# Patient Record
Sex: Female | Born: 1935 | Race: White | Hispanic: No | Smoking: Former smoker
Health system: Southern US, Community
[De-identification: ages and names within clinical notes are randomized; demographics above are authoritative.]

## PROBLEM LIST (undated history)

## (undated) DIAGNOSIS — H409 Unspecified glaucoma: Secondary | ICD-10-CM

## (undated) DIAGNOSIS — M1990 Unspecified osteoarthritis, unspecified site: Secondary | ICD-10-CM

## (undated) DIAGNOSIS — H269 Unspecified cataract: Secondary | ICD-10-CM

## (undated) HISTORY — DX: Unspecified osteoarthritis, unspecified site: M19.90

## (undated) HISTORY — PX: EYE SURGERY: SHX253

## (undated) HISTORY — DX: Unspecified glaucoma: H40.9

## (undated) HISTORY — DX: Unspecified cataract: H26.9

## (undated) HISTORY — PX: OTHER SURGICAL HISTORY: SHX169

---

## 1998-03-30 ENCOUNTER — Ambulatory Visit (HOSPITAL_COMMUNITY): Admission: RE | Admit: 1998-03-30 | Discharge: 1998-03-30 | Payer: Self-pay | Admitting: *Deleted

## 2000-01-07 ENCOUNTER — Other Ambulatory Visit: Admission: RE | Admit: 2000-01-07 | Discharge: 2000-01-07 | Payer: Self-pay | Admitting: Orthopedic Surgery

## 2000-03-12 ENCOUNTER — Ambulatory Visit (HOSPITAL_COMMUNITY): Admission: RE | Admit: 2000-03-12 | Discharge: 2000-03-12 | Payer: Self-pay | Admitting: Ophthalmology

## 2001-06-10 ENCOUNTER — Ambulatory Visit (HOSPITAL_COMMUNITY): Admission: RE | Admit: 2001-06-10 | Discharge: 2001-06-10 | Payer: Self-pay | Admitting: *Deleted

## 2003-10-12 ENCOUNTER — Ambulatory Visit: Admission: RE | Admit: 2003-10-12 | Discharge: 2003-10-12 | Payer: Self-pay | Admitting: Internal Medicine

## 2004-06-20 ENCOUNTER — Encounter: Admission: RE | Admit: 2004-06-20 | Discharge: 2004-06-20 | Payer: Self-pay | Admitting: Orthopedic Surgery

## 2004-06-21 ENCOUNTER — Ambulatory Visit (HOSPITAL_BASED_OUTPATIENT_CLINIC_OR_DEPARTMENT_OTHER): Admission: RE | Admit: 2004-06-21 | Discharge: 2004-06-21 | Payer: Self-pay | Admitting: Orthopedic Surgery

## 2004-06-21 ENCOUNTER — Ambulatory Visit (HOSPITAL_COMMUNITY): Admission: RE | Admit: 2004-06-21 | Discharge: 2004-06-21 | Payer: Self-pay | Admitting: Orthopedic Surgery

## 2004-11-25 ENCOUNTER — Encounter: Admission: RE | Admit: 2004-11-25 | Discharge: 2004-11-25 | Payer: Self-pay | Admitting: Internal Medicine

## 2005-10-07 ENCOUNTER — Encounter (INDEPENDENT_AMBULATORY_CARE_PROVIDER_SITE_OTHER): Payer: Self-pay | Admitting: Specialist

## 2005-10-07 ENCOUNTER — Ambulatory Visit (HOSPITAL_COMMUNITY): Admission: RE | Admit: 2005-10-07 | Discharge: 2005-10-07 | Payer: Self-pay | Admitting: Internal Medicine

## 2006-08-19 ENCOUNTER — Encounter (INDEPENDENT_AMBULATORY_CARE_PROVIDER_SITE_OTHER): Payer: Self-pay | Admitting: *Deleted

## 2006-08-19 ENCOUNTER — Ambulatory Visit (HOSPITAL_BASED_OUTPATIENT_CLINIC_OR_DEPARTMENT_OTHER): Admission: RE | Admit: 2006-08-19 | Discharge: 2006-08-19 | Payer: Self-pay | Admitting: Orthopedic Surgery

## 2006-12-22 ENCOUNTER — Ambulatory Visit (HOSPITAL_COMMUNITY): Admission: RE | Admit: 2006-12-22 | Discharge: 2006-12-22 | Payer: Self-pay | Admitting: *Deleted

## 2006-12-22 ENCOUNTER — Encounter (INDEPENDENT_AMBULATORY_CARE_PROVIDER_SITE_OTHER): Payer: Self-pay | Admitting: *Deleted

## 2008-02-02 ENCOUNTER — Ambulatory Visit (HOSPITAL_COMMUNITY): Admission: RE | Admit: 2008-02-02 | Discharge: 2008-02-02 | Payer: Self-pay | Admitting: *Deleted

## 2008-02-02 ENCOUNTER — Encounter (INDEPENDENT_AMBULATORY_CARE_PROVIDER_SITE_OTHER): Payer: Self-pay | Admitting: *Deleted

## 2008-12-29 ENCOUNTER — Encounter: Admission: RE | Admit: 2008-12-29 | Discharge: 2008-12-29 | Payer: Self-pay | Admitting: Internal Medicine

## 2010-04-24 ENCOUNTER — Ambulatory Visit (HOSPITAL_COMMUNITY)
Admission: RE | Admit: 2010-04-24 | Discharge: 2010-04-24 | Payer: Self-pay | Source: Home / Self Care | Attending: Internal Medicine | Admitting: Internal Medicine

## 2010-09-11 NOTE — Op Note (Signed)
[name] [name], [name] [name]                ACCOUNT NO.:  [id number]   MEDICAL RECORD NO.:  [id number]          PATIENT TYPE:  AMB   LOCATION:  ENDO                         FACILITY:  [facility]   PHYSICIAN:  Georgiana Spinner, M.D.    DATE OF BIRTH:  11/05/1922   DATE OF PROCEDURE:  12/22/2006  DATE OF DISCHARGE:                               OPERATIVE REPORT   PROCEDURE:  Upper endoscopy.   INDICATIONS:  Abdominal pain.   ANESTHESIA:  Fentanyl 50 mcg, Versed 3 mg.   DESCRIPTION OF PROCEDURE:  With the patient mildly sedated in the left  lateral decubitus position, the Pentax videoscopic endoscope was  inserted in the mouth, passed under direct vision through the esophagus,  which appeared normal on first glance.  We entered into the stomach,  fundus, body, antrum, duodenal bulb, second portion duodenum which  showed a hiatal hernia only.  From this point the endoscope was slowly  withdrawn, taking circumferential views of duodenal mucosa until the  endoscope had been pulled back into the stomach, placed in retroflexion  to view the stomach from below.  The endoscope was then straightened and  withdrawn, taking circumferential views of remaining gastric and  esophageal mucosa, stopping to biopsy the distal esophagus which showed  a possible small area of Barrett's esophagus extending cephalad in the  esophagus.  The patient's vital signs and pulse oximeter remained  stable.  The patient tolerated the procedure well without apparent  complications.   FINDINGS:  Question of Barrett's esophagus, biopsied.  Await biopsy  report.  The patient will call me for results and follow up with me as  an outpatient.           ______________________________  Georgiana Spinner, M.D.     GMO/MEDQ  D:  12/22/2006  T:  12/22/2006  Job:  [id number]

## 2010-09-11 NOTE — Op Note (Signed)
NAMELEITA, LINDBLOOM                ACCOUNT NO.:  [id number]   MEDICAL RECORD NO.:  [id number]          PATIENT TYPE:  AMB   LOCATION:  ENDO                         FACILITY:  [facility]   PHYSICIAN:  Georgiana Spinner, M.D.    DATE OF BIRTH:  [birth year]   DATE OF PROCEDURE:  DATE OF DISCHARGE:                               OPERATIVE REPORT   PROCEDURE:  Upper endoscopy with biopsy.   INDICATIONS:  Gastroesophageal reflux disease with history of Barrett  esophagus.   ANESTHESIA:  Fentanyl 50 mcg, Versed 4 mg.   PROCEDURE:  With the patient mildly sedated in the left lateral  decubitus position the Pentax videoscopic endoscope was inserted in the  mouth, passed under direct vision through the esophagus which appeared  normal, although there was a questionable area of Barrett that I could  not really well see.  I photographed it and subsequently biopsied it.  We entered into the stomach, fundus, body, antrum, duodenal bulb, second  portion of the duodenum appeared normal.  From this point the endoscope  was slowly withdrawn taking circumferential views of duodenal mucosa  until the endoscope had been pulled back in the stomach placed in  retroflexion to view the stomach from below.  The endoscope was  straightened and withdrawn taking circumferential views of the remaining  gastric and esophageal mucosa.  The patient's vital signs and pulse  oximeter remained stable.  The patient tolerated the procedure well  without apparent complication.   FINDINGS:  Question of Barrett esophagus, biopsied.  Await biopsy  report.  The patient will call me for results and follow-up with me as  an outpatient.           ______________________________  Georgiana Spinner, M.D.     GMO/MEDQ  D:  02/02/2008  T:  02/02/2008  Job:  [id number]

## 2010-09-14 NOTE — Op Note (Signed)
[facility]  Patient:    [name] [name], [name] [name] Visit Number: [id number] MRN: [id number]          Service Type: END Location: ENDO Attending Physician:  Sabino Gasser Dictated by:   Sabino Gasser, M.D. Admit Date:  06/10/2001                             Operative Report  PROCEDURE:  Colonoscopy.  ENDOSCOPIST:  Sabino Gasser, M.D.  INDICATIONS:  Followup of colon polyps.  ANESTHESIA:  Demerol 50, Versed 3 mg.  DESCRIPTION OF PROCEDURE:  With the patient mildly sedated in the left lateral decubitus position, subsequently with pressure applied, the Olympus videoscopic colonoscope was inserted in the rectum and passed, under direct vision, to the cecum identified by the ileocecal valve, and appendiceal orifice both of which were photographed.  From this point, the colonoscope was slowly withdrawn taking circumferential views of the entire colonic mucosa stopping only in the rectum which appeared normal, on direct and in retroflex view.  The endoscope was straightened and withdrawn.  The patients vital signs and pulse oximeter remained stable.  The patient tolerated the procedure well without apparent complications.  FINDINGS:  Essentially negative colonoscopic examination to the cecum.  PLAN:  Repeat examination in 5 years. Dictated by:   Sabino Gasser, M.D. Attending Physician:  Sabino Gasser DD:  06/10/01 TD:  06/10/01 Job: [id number]

## 2010-09-14 NOTE — Op Note (Signed)
[place]  Patient:    [name] [name], [name] [name]                       MRN: [id number] Proc. Date: 03/12/00 Adm. Date:  [id number] Disc. Date: [id number] Attending:  Tommy Medal                           Operative Report  INDICATIONS AND JUSTIFICATIONS FOR THE PROCEDURE:  [name] [name] has been followed in my office for a number of years.  She was seen in 1994 and has been seen on a regular basis since she takes Plaquenil".  She has noticed moderately severe redundant skin of each upper lid blocking her upper field of vision and indeed visual field testing and photographs document this. She says that this bothers her upper field of vision and she can see something like a "show" over her vision.  She also has trouble reading.  Medically she should be stable with respect to having an upper lid blepharoplasty performed. Examination confirms that the skin of each lid indeed completely covers the lid margin and comes to the edge of her pupil.  Otherwise the pressure is 13 in each eye and the pupils are equal and reactive.  Her vision is correctable to 20/20 in the right eye and 20/30 in the left eye.  The conjunctivae, cornea and 2 chamber and dilated fundus exams are normal and she has 0.3 optic nerve cupping bilaterally.  She also has lens implants in each eye.  After discussing this with her she decided she did want upper eyelid optical blepharoplasties in order to improve her vision and improve the symptoms that she is having.  JUSTIFICATION FOR PERFORMING PROCEDURE IN AN OUTPATIENT SETTING:  Routine.  JUSTIFICATION FOR OVERNIGHT STAY: None.  PREOPERATIVE DIAGNOSIS:  Severe blepharochalasis with visual impairment.  POSTOPERATIVE DIAGNOSIS:  Severe blepharochalasis with visual impairment.  SURGEON:  Robert L. Dione Booze, M.D.  ANESTHESIA:  1% Xylocaine with epinephrine.  PROCEDURE:  Upper eyelid optical blepharoplasties.  PROCEDURE IN DETAIL:   The  patient arrived in the minor surgery room at [facility] and was prepped and draped in the routine fashion.  The skin to be excised was carefully demarcated using a sterile marking pencil and then a blade was used to cut along the lines that had been marked.  The skin was then carefully excised using forceps and scissors.  Then some underlying fatty tissue was also excised.  The wound was then closed with a single, running, 6-0 nylon suture on each side and patches were applied.  Neosporin ointment was also used.  After this the patient left the minor surgery room having done nicely.  FOLLOWUP CARE:  The patient was told to remove her patches in several hours and to use ointment inside her lower lids when she goes to bed at night.  She is to use warm compresses for comfort.  She is to be seen in my office in about 6 days to have the sutures removed. DD:  03/15/00 TD:  03/16/00 Job: [id number]

## 2010-09-14 NOTE — Op Note (Signed)
NAMELAWAN, NANEZ                ACCOUNT NO.:  [id number]   MEDICAL RECORD NO.:  [id number]          PATIENT TYPE:  AMB   LOCATION:  ENDO                         FACILITY:  [facility]   PHYSICIAN:  Georgiana Spinner, M.D.    DATE OF BIRTH:  [birth year]   DATE OF PROCEDURE:  10/07/2005  DATE OF DISCHARGE:                                 OPERATIVE REPORT   PROCEDURE:  Colonoscopy.   INDICATIONS:  Colon polyps.   ANESTHESIA:  Fentanyl 50 mcg, Versed 6 mg.   PROCEDURE:  The patient mildly sedated in the left lateral decubitus  position the Olympus videoscopic colonoscope was inserted in the rectum and  passed under direct vision to cecum identified by ileocecal valve and  appendiceal orifice both which were photographed.  From this point the  colonoscope was slowly withdrawn taking circumferential views of colonic  mucosa stopping only in the rectum where a small polyp was seen and removed  using hot biopsy forceps technique setting of 20/200 blended current and  hemorrhoids were seen on retroflexed view the endoscope was straightened and  withdrawn.  The patient's vital signs, pulse oximeter remained stable.  The  patient tolerated procedure well without apparent complications.   FINDINGS:  Internal hemorrhoids, small polyp of rectum, otherwise  unremarkable examination.   PLAN:  Await biopsy report.  The patient will call me for results and follow-  up with me as an outpatient.           ______________________________  Georgiana Spinner, M.D.     GMO/MEDQ  D:  10/07/2005  T:  10/07/2005  Job:  [id number]

## 2010-09-14 NOTE — Op Note (Signed)
NAMEZAMARA, COZAD                ACCOUNT NO.:  [id number]   MEDICAL RECORD NO.:  [id number]          PATIENT TYPE:  AMB   LOCATION:  DSC                          FACILITY:  [facility]   PHYSICIAN:  Katy Fitch. Sypher, M.D. DATE OF BIRTH:  [birth year]   DATE OF PROCEDURE:  08/19/2006  DATE OF DISCHARGE:                               OPERATIVE REPORT   PREOP DIAGNOSIS:  Chronic rheumatoid arthritis with severe synovitis and  prior reconstruction of severely unstable right index, long, ring, and  small finger metacarpal phalangeal joints with premature fracture of  implants to right long and small finger metacarpal phalangeal joints.   POSTOP DIAGNOSIS:  Poorly controlled rheumatoid synovitis leading to  soft tissue failure of reconstructions creating excess stress on the  implant arthroplasty components leading to a shear fracture of the long  finger proximal phalangeal stem adjacent to the hinge mechanism and  fracture of the right small finger metacarpal phalangeal joint implant  through the hinge mechanism.   OPERATIONS:  1. Re-exploration of right long finger metacarpal phalangeal joint      implant arthroplasty, 26 months status post index procedure, with      identification of fracture of size 30 [employer] NeuFlex hinge      arthroplasty component with subsequent synovectomy, reconstruction      of radial collateral ligament, reimplantation of a size 30 NeuFlex      implant followed by extensor tendon centralization.  2. Re-exploration of right small finger metacarpal phalangeal joint      implant arthroplasty with recovery of fractured implant components,      synovectomy, reconstruction of radial collateral ligament, and      replacement of a size 10 NeuFlex implant arthroplasty component,      followed by extensor tendon centralization.   OPERATING SURGEON:  Katy Fitch. Sypher, M.D.   ASSISTANT:  Marveen Reeks. Dasnoit, P.A.-C.   ANESTHESIA:  General by LMA.   SUPERVISING  ANESTHESIOLOGIST:  Guadalupe Maple, M.D.   INDICATIONS:  [name] [name] is an 77 year old woman referred through the  courtesy of Dr. Lemmie Evens for evaluation and management of  chronic rheumatoid hand deformities.   In 2002 she appeared to have good control of her synovitis and underwent  a reconstruction of her right, index, long, ring, and small finger  metacarpal phalangeal joints utilizing [employer] NeuFlex Silastic implant  arthroplasty components.   In the early postoperative period she had a superior result with  complete correction of her ulnar drift, intrinsic tightness, and an 80-  degrees arc of metacarpal phalangeal joint motion.   During our most recent follow up evaluation, 2 years post surgery, I  noted a subluxation deformity of the long finger metacarpal phalangeal  joint.  Follow up x-rays revealed that the long finger implant  arthroplasty components had fractured; and the joint was overriding with  a palmar subluxation of the proximal phalanx beneath the metacarpal  head.  There were issues with the ring and small finger MP joints as  well with signs of recurrent synovitis.  I advised [name] to  proceed with  a timely reimplantation of a new hinge with proper soft  tissue management to prevent loss of her reconstructive result.   We reviewed her medical status.  She is currently being maintained on  Plaquenil and Voltaren.  She is not on any other disease remitting  agents.  She has returned to the operating room, at this time,  anticipating repeat reconstruction of the long finger.  During our  interview in the preoperative holding area, her small finger appeared to  be subluxed.  I went back and studied her most recent x-rays of March  2008.  With the aid of a loupe magnification I discerned that she had a  tear in her implant in the small finger MP joint; therefore, I  recommended proceeding with revision of the small finger MP joint at  this time as  well.   Preoperatively, I had a lengthy discussion with [name].  She is  understandably frustrated that her reconstructive hand surgery has lost  some of its intended result.  I explained to her that it was not  entirely clear to me what the primary pathophysiology was at this time.  Inspection of her hands reveals persistent synovitis in the region of  her MP joints and extensor tendons.  She has a native thumb MP joint  that is experiencing a type 1Z collapse.  There is a significant  possibility that she has poorly controlled synovitis.  After informed  consent, she is brought to the operating room, at this time,  anticipating revision surgery.  She understands that unless her soft  tissue illness is controlled we cannot guarantee any result of  arthroplasty.   DESCRIPTION OF PROCEDURE:  [name] is brought to the operating room  and placed in the supine position upon the operating table.  Following  an anesthesia consult with Dr. Noreene Larsson, general anesthesia by LMA was  recommended and accepted by [name].  She was subsequent transferred  to room #1, placed in the supine position on the operating table, and 1  gram of Ancef administered as an IV prophylactic antibiotic.  The right  arm was prepped with Betadine soap and solution and sterilely draped.  A  pneumatic tourniquet was applied to the proximal brachium.   Following exsanguination, of the right arm with an Esmarch bandage, the  arterial tourniquet was inflated to 220 mmHg.  The procedure commenced  with resection of the prior surgical scar overlying the long, ring, and  small finger MP joints.  Great care was taken to identify the dorsal  veins and cutaneous sensory nerves.  The extensor tendon of the long  finger was noted to be bulging overlying a capsule of a florid  rheumatoid synovitis.   The extensor tendon was split along its radial border; and the rheumatoid synovitis meticulously debrided.  A complete  synovectomy of  the MP joint was accomplished followed by recovery of the implant  fragments.  The implant was intact with respect to the proximal stem and  hinge.  However, careful inspection of the hinge revealed an 80% failure  of the hinge mechanism at 26 months post implantation.  The distal hinge  stem implant had sheared causing the subluxation of the joint.  The  distal component was easily retrieved with a fine Therapist, nutritional.  The  intermaxillary canals were curetted to remove all foreign body material  followed by meticulous synovectomy of the joint.  The radial collateral  ligament was reconstructed by gathering the distal  ligament with a 3-0  FiberWire suture and tensioning the radial capsule and collateral  ligament to correct ulnar drift.  After irrigation a new size 30 implant  was placed in the long finger MP joint with minimal touch technique  followed by repair of the capsule, and extensor mechanism with multiple  mattress sutures of 3-0 Ethibond.  The extensor tendon was centralized.  The radial sagittal fibers were in good condition.   Attention was then directed to the small finger.  The neurovascular  structures were gently dissected off of the capsule followed by incision  along the radial border of the extensor digitorum longus tendon.  After  capsulotomy, a complete synovectomy was accomplished followed by removal  of the fractured implant components.  In the small finger there was  excessive wear in the ulnar aspect of the hinge; and the hinge had split  in an oblique manner across the hinge.  This was a very unusual manner  of failure based on 25 years experience of doing rheumatoid  reconstructive surgery.   I contacted the [employer] orthopedic representative and explained my  concerns about early fracture of the implants at only 20 some months  following implantation or perhaps even sooner.  He asked that I retain  the components and send them back to [facility] for  analysis.  The implants  were disinfected in a chlorhexidine solution for 15 minutes; and  subsequently washed with water and placed in sterile saline.  They will  be provided for [facility] to analyze.   The small finger intermedullary canals of the proximal phalanx and  metacarpal were cleared of all soft tissues with the micro rongeur,  micro curette, and standard curette.  After synovectomy was completed,  the radial collateral ligament was reconstructed with a grasping suture  of 3-0 FiberWire followed by reimplantation of a size 10 [employer] NeuFlex  arthroplasty component.  The capsule was repaired followed by repair of  the extensor mechanism centralizing the extensor digitorum longus and  extensor digiti minimi.  The prior subluxation and ulnar drift of the MP  joints was corrected.  The intrinsic tightness noted in the long finger  with recurrent swan-neck deformity was also corrected by this procedure.  The skin wound was then repaired with intradermal 3-0 Prolene and Steri-  Strips.  Ms. Bagent was placed in a voluminous gauze dressing with  loops of gauze, maintaining radial deviation of the MP joints with the  MP joints at about 15 degrees of flexion.  Dorsal-to-palmar plaster  sandwich splints were applied for postoperative dressing.  There no  apparent complications.   For aftercare she will be provided prescriptions for Percocet 5 mg one  p.o. q.4-6 h. p.r.n. pain, 30 tablets without refill.  Also Keflex 500  mg one p.o. q.8 h. x4 days as a prophylactic antibiotic.  She will  return to our office for followup in 1 week for dressing change, x-ray,  and advancement into a splinting program.      Katy Fitch. Sypher, M.D.  Electronically Signed     RVS/MEDQ  D:  08/19/2006  T:  08/19/2006  Job:  [id number]   cc:   Lemmie Evens, M.D.

## 2010-09-14 NOTE — Op Note (Signed)
NAMELEANER, MORICI                ACCOUNT NO.:  [id number]   MEDICAL RECORD NO.:  [id number]          PATIENT TYPE:  AMB   LOCATION:  DSC                          FACILITY:  [facility]   PHYSICIAN:  Katy Fitch. Sypher Montez Hageman., M.D.DATE OF BIRTH:  24-Jun-1922   DATE OF PROCEDURE:  06/21/2004  DATE OF DISCHARGE:                                 OPERATIVE REPORT   PREOPERATIVE DIAGNOSIS:  Rheumatoid arthritis affecting the right hand with  a type 1 Z collapse deformity of right thumb and significant ulnar drift and  intrinsic tightness with subluxation of the index, long, ring and small  finger metacarpal phalangeal joints; status post failure of conservative  management.   POSTOPERATIVE DIAGNOSIS:  Rheumatoid arthritis affecting the right hand with  a type 1 Z collapse deformity of right thumb and significant ulnar drift and  intrinsic tightness with subluxation of the index, long, ring and small  finger metacarpal phalangeal joints; status post failure of conservative  management.   OPERATION:  1.  Implant arthroplasty of right index finger metacarpal phalangeal joint      with radial collateral ligament reconstruction, ulnar intrinsic release,      centralization of extensor tendon and placement of a size 20 DePuy      NuFlex implant.  2.  Implant arthroplasty of right long finger MP joint with radial      collateral ligament reconstruction, intrinsic release extensor      realignment and placement of size 30 DePuy NuFlex implant.  3.  Implant arthroplasty of right ring finger metacarpal phalangeal joint      with radial collateral ligament reconstruction, extensor realignment and      intrinsic release utilizing a size 20 DePuy NuFlex implant.  4.  Implant arthroplasty of right small finger metacarpal phalangeal joint      with radial collateral ligament reconstruction, abductor digiti minimi      release, extensor realignment and placement of a size 10 Depuy NuFlex      implant.   OPERATING SURGEON:  Katy Fitch. Sypher, M.D.   ASSISTANT:  Jonni Sanger, P.A.   ANESTHESIA:  Infraclavicular block.   SUPERVISING ANESTHESIOLOGIST:  Dr. Adonis Huguenin.   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 Syliva Overman, rheumatologist for  evaluation and management of bilateral rheumatoid hand deformities.   She had type 1 Z collapse deformity of right thumb, ulnar drift of all of  her fingers, moderate involvement of her interphalangeal joints with  degenerative and rheumatoid arthritis changes and impaired grasped and pinch  prehension.   We had two lengthy consultations regarding her hand predicament. I advised  her to live with her hands as long as she possibly could and at some point  the future told her she would benefit from fusion of her thumb MP joint and  implant arthroplasty of her index long ring and small finger MP joints.  Her  wrist was stable and not in radial deviation.  After a lengthy period of  deliberation, she decided to proceed with reconstruction of her  right index,  long, ring and small fingers and deferred treatment of her thumb at this  time.  She is brought to the operating room, anticipating reconstruction.  We have explained potential risks, benefits in detail including potential  anesthetic complications, postoperative complications including infection,  pneumonia, cardiac issues and pulmonary embolus.  Questions regarding her  surgery were invited, answered. She understands the procedure, aftercare,  potential risks, and benefits.   PROCEDURE:  Alia Parsley was brought to the operating room and placed in  supine position on the table.  Following anesthesia consult by Dr. Krista Blue,  an  infraclavicular block was placed without complication on the right.  Following this the proper site identification protocol, a time-out was  completed followed by induction of general  sedation.   The right arm was prepped with Betadine soap solution, sterilely draped. A  pneumatic tourniquet was applied to the proximal brachium.  Following  exsanguination of right arm with Esmarch bandage, the arterial tourniquet  was inflated to 220 mmHg. 1 gram of Ancef was administered as an IV  prophylactic antibiotic in the preoperative holding area.   The procedure commenced with a standard incision extending from the radial  aspect the metacarpal neck of the index finger to the ulnar aspect of the  small finger metacarpal neck.  Dorsal veins and sensory nerves were  preserved. The extensor mechanisms were dissected to allow complete  evaluation of the intrinsic tightness predicament and the breakdown of the  radial sagittal fibers and tightness of the ulnar intrinsics.   The index, long, ring and small fingers were sequentially treated in the  same manner with exception of small finger which had specific release of the  abductor digiti minimi tendon followed by exposure of the capsule by release  the radial sagittal fibers, longitudinal incision of the capsules, thorough  synovectomy of the index, long, ring and small finger metacarpal phalangeal  joints, release of the ulnar collateral ligaments, careful preservation of  the radial collateral ligaments, subsequent exposure of the metacarpal necks  with baby Bennett retractors, resection of the metacarpal heads index, long,  ring, small followed by preparation of the canals, the metacarpals and  proximal phalanges utilizing the DePuy reamers and hand technique except  with a small finger that required use of a Swanson reamer to size the  proximal phalanx to accept a size 10 implant. The trials were used  sequentially to size proper implants, ultimately deciding on a size 20 for  the index, size 34 for the long, size 20 for the ring, and size 10 for the  small.  The medullary canals were thoroughly irrigated with triple  antibiotic  solution, followed by careful debridement of all synovitis. The implants  were placed with no-touch technique followed by sequential reconstruction of  the radial collateral ligaments of the index, long, ring and small fingers  with through bone technique in the manner of Swanson, followed by anatomic  realignment of the extensor mechanism by reefing of the radial sagittal  fibers, release of the ulnar sagittal fibers and release of the ulnar  intrinsic tendons in the manner of Littler and more proximal release when  necessary.   In the small finger, the juncture of the extensor digiti minimi and the  common extensor was carefully reconstructed to centralize the extensor as  well as create stability by reefing of the radial sagittal fibers. The ulnar  sagittal fibers were quite patulous in the small finger due to extensive  synovitis.  At the completion of procedures, the fingers were all aligned in a neutral  position with relief of the intrinsic tightness, with a negative Bunnell's  intrinsic tightness test. The alignment was virtually anatomic.   The tourniquet was released and hemostasis achieved. Tourniquet time was  approximately one hour 59 minutes at 220 mmHg. Bleeding was controlled by 15  minutes of direct pressure followed by closure of the wound over vessel loop  drains utilizing 4-0 Prolene intradermal suture.   The wound was dressed with Xeroflo after expression of hematoma followed by  application of voluminous sterile gauze dressing with a radialization loops  of gauze followed by Kerlix fluff, sterile Webril and a sugar-tong sandwich  splint maintaining the wrist in 30 degrees dorsiflexion and MPs blocked at  approximately 40 degrees of flexion.   There were no apparent complications. Ms. Mcgloin tolerated the  infraclavicular block and her sedation well.  She will be transferred to the  Post Anesthesia Care Unit and ultimately to recovery care center  for  overnight observation anticipating IV Ancef 1 gram q.8 h as a prophylactic  biotic x3 doses as well as p.o. and IV Dilaudid for pain and possible use of  a PCA pump should she experience significant discomfort.      RVS/MEDQ  D:  06/21/2004  T:  06/21/2004  Job:  [id number]   cc:   Lemmie Evens, M.D.  [address]  [place]  [place] [address]  Fax: [phone]

## 2011-02-19 ENCOUNTER — Other Ambulatory Visit: Payer: Self-pay | Admitting: Ophthalmology

## 2012-09-10 ENCOUNTER — Other Ambulatory Visit: Payer: Self-pay | Admitting: Internal Medicine

## 2012-09-10 DIAGNOSIS — Z1231 Encounter for screening mammogram for malignant neoplasm of breast: Secondary | ICD-10-CM

## 2012-10-01 ENCOUNTER — Ambulatory Visit
Admission: RE | Admit: 2012-10-01 | Discharge: 2012-10-01 | Disposition: A | Discharge status: Home / Self Care | Payer: Medicare Other | Source: Ambulatory Visit | Attending: Internal Medicine | Admitting: Internal Medicine

## 2012-10-01 DIAGNOSIS — Z1231 Encounter for screening mammogram for malignant neoplasm of breast: Secondary | ICD-10-CM

## 2012-10-07 ENCOUNTER — Other Ambulatory Visit: Payer: Self-pay | Admitting: Internal Medicine

## 2012-10-07 DIAGNOSIS — R928 Other abnormal and inconclusive findings on diagnostic imaging of breast: Secondary | ICD-10-CM

## 2012-10-21 ENCOUNTER — Ambulatory Visit
Admission: RE | Admit: 2012-10-21 | Discharge: 2012-10-21 | Disposition: A | Discharge status: Home / Self Care | Payer: Medicare Other | Source: Ambulatory Visit | Attending: Internal Medicine | Admitting: Internal Medicine

## 2012-10-21 DIAGNOSIS — R928 Other abnormal and inconclusive findings on diagnostic imaging of breast: Secondary | ICD-10-CM

## 2013-05-13 ENCOUNTER — Ambulatory Visit: Payer: Self-pay | Admitting: Podiatry

## 2013-05-14 ENCOUNTER — Ambulatory Visit (INDEPENDENT_AMBULATORY_CARE_PROVIDER_SITE_OTHER): Payer: Medicare Other | Admitting: Podiatrist

## 2013-05-14 ENCOUNTER — Encounter: Payer: Self-pay | Admitting: Podiatrist

## 2013-05-14 VITALS — BP 117/72 | HR 60 | Resp 18

## 2013-05-14 DIAGNOSIS — M216X2 Other acquired deformities of left foot: Secondary | ICD-10-CM

## 2013-05-14 DIAGNOSIS — Q828 Other specified congenital malformations of skin: Secondary | ICD-10-CM

## 2013-05-14 DIAGNOSIS — M216X9 Other acquired deformities of unspecified foot: Secondary | ICD-10-CM

## 2013-05-14 NOTE — Progress Notes (Signed)
   Subjective:    Patient ID: [name] [name], female    DOB: 1923/02/11, 80 y.o.   MRN: [id number]  HPI I have a place on the ball of my left  foot and has been going on for a year and sore and tender and it hurts the way I walk    Review of Systems  Constitutional: Negative.   Eyes: Positive for pain, redness and itching.  Respiratory: Negative.   Cardiovascular: Negative.   Gastrointestinal: Negative.   Endocrine: Positive for cold intolerance.  Genitourinary: Negative.   Musculoskeletal: Positive for back pain.       Difficulty walking  Skin: Positive for rash.  Allergic/Immunologic: Negative.   Neurological: Positive for dizziness and headaches.  Hematological: Bruises/bleeds easily.  Psychiatric/Behavioral: Negative.        Objective:   Physical Exam Chart is reviewed. Neurovascular status is intact.  Palpable pedal pulses are noted.  Hyperkeratotic lesion submetatarsal 2 is present.  Ground glass appearance with intact integument post debridement noted.  Prominent metatarsal with porokeratotic lesion left     Assessment & Plan:  Prominent metatarsal with porokeratotic lesion submetatarsal 2 left foot  Plan: Debrided the lesion with a #15 blade without complication. Padding was also applied and the patient was given instructions for aftercare. She'll be seen back when necessary

## 2013-05-14 NOTE — Patient Instructions (Signed)

## 2013-06-04 NOTE — Addendum Note (Signed)
Addended by: Delories Heinz on: 06/04/2013 05:14 PM   Modules accepted: Level of Service

## 2013-08-13 ENCOUNTER — Encounter (HOSPITAL_COMMUNITY): Payer: Self-pay | Admitting: Emergency Medicine

## 2013-08-13 ENCOUNTER — Emergency Department (HOSPITAL_COMMUNITY)
Admission: EM | Admit: 2013-08-13 | Discharge: 2013-08-13 | Disposition: A | Discharge status: Home / Self Care | Payer: Medicare Other | Attending: Emergency Medicine | Admitting: Emergency Medicine

## 2013-08-13 DIAGNOSIS — Z8669 Personal history of other diseases of the nervous system and sense organs: Secondary | ICD-10-CM | POA: Insufficient documentation

## 2013-08-13 DIAGNOSIS — Z9181 History of falling: Secondary | ICD-10-CM | POA: Insufficient documentation

## 2013-08-13 DIAGNOSIS — F17200 Nicotine dependence, unspecified, uncomplicated: Secondary | ICD-10-CM | POA: Insufficient documentation

## 2013-08-13 DIAGNOSIS — M7981 Nontraumatic hematoma of soft tissue: Secondary | ICD-10-CM | POA: Insufficient documentation

## 2013-08-13 DIAGNOSIS — Z8781 Personal history of (healed) traumatic fracture: Secondary | ICD-10-CM | POA: Insufficient documentation

## 2013-08-13 DIAGNOSIS — M129 Arthropathy, unspecified: Secondary | ICD-10-CM | POA: Insufficient documentation

## 2013-08-13 DIAGNOSIS — Z7982 Long term (current) use of aspirin: Secondary | ICD-10-CM | POA: Insufficient documentation

## 2013-08-13 DIAGNOSIS — T148XXA Other injury of unspecified body region, initial encounter: Secondary | ICD-10-CM

## 2013-08-13 DIAGNOSIS — Z79899 Other long term (current) drug therapy: Secondary | ICD-10-CM | POA: Insufficient documentation

## 2013-08-13 NOTE — Discharge Instructions (Signed)

## 2013-08-13 NOTE — ED Provider Notes (Signed)
Medical screening examination/treatment/procedure(s) were conducted as a shared visit with non-physician practitioner(s) and myself.  I personally evaluated the patient during the encounter.   EKG Interpretation None      Patient seen by me. Patient with bruising to her left distal shin area. No skin tear small hematoma underneath. Patient not sure when it happened. Patient's not on Coumadin or warfarin or other blood thinners. Patient given reassurance that it will heal and to be careful about bumping it is a skin to tear easily.  Shelda Jakes, MD 08/13/13 [phone]

## 2013-08-13 NOTE — ED Notes (Signed)
Pt states she has a brusie to her lower left leg that she first noticed today while in the shower.  Pt states it is not causing any pain, but was somewhat tender to assessors palpation.  Skin is not warmer than surrounding tissues and is dry.

## 2013-08-13 NOTE — ED Provider Notes (Signed)
CSN: [id number]     Arrival date & time 08/13/13  1725 History   First MD Initiated Contact with Patient 08/13/13 1815     Chief Complaint  Patient presents with  . Bleeding/Bruising     (Consider location/radiation/quality/duration/timing/severity/associated sxs/prior Treatment) HPI Comments: Patient presents to the emergency department with chief complaint of contusion. She states that she noticed a bruise to her left ankle. He denies any recent injury, but states that she did fall about 2 months ago. She sustained a broken wrist at that time, which was managed by another provider. He states that she showed a bruise to a pharmacist today, and was instructed to have it examined. She denies any associated pain, swelling, fever, or chills. Denies any chest pain or shortness of breath. She takes a baby aspirin daily.  The history is provided by the patient. No language interpreter was used.    Past Medical History  Diagnosis Date  . Arthritis   . Cataract   . Glaucoma    Past Surgical History  Procedure Laterality Date  . Eye surgery    . Right hand surgery     History reviewed. No pertinent family history. History  Substance Use Topics  . Smoking status: Current Every Day Smoker    Types: Cigarettes  . Smokeless tobacco: Never Used  . Alcohol Use: No   OB History   Grav Para Term Preterm Abortions TAB SAB Ect Mult Living                 Review of Systems  Constitutional: Negative for fever and chills.  Respiratory: Negative for shortness of breath.   Cardiovascular: Negative for chest pain.  Gastrointestinal: Negative for nausea, vomiting, diarrhea and constipation.  Genitourinary: Negative for dysuria.  Hematological: Bruises/bleeds easily.      Allergies  Review of patient's allergies indicates no known allergies.  Home Medications   Prior to Admission medications   Medication Sig Start Date End Date Taking? Authorizing Provider  aspirin 81 MG tablet Take 81  mg by mouth daily.   Yes Historical Provider, MD  atorvastatin (LIPITOR) 20 MG tablet Take 20 mg by mouth daily.  05/13/13  Yes Historical Provider, MD  cholecalciferol (VITAMIN D) 1000 UNITS tablet Take 1,000 Units by mouth daily.   Yes Historical Provider, MD  denosumab (PROLIA) 60 MG/ML SOLN injection Inject 60 mg into the skin every 6 (six) months. Administer in upper arm, thigh, or abdomen   Yes Historical Provider, MD  folic acid (FOLVITE) 1 MG tablet Take 1 mg by mouth daily.  05/13/13  Yes Historical Provider, MD  methotrexate (RHEUMATREX) 2.5 MG tablet Take 2.5 mg by mouth once a week. Caution:Chemotherapy. Protect from light.   Yes Historical Provider, MD  Multiple Vitamins-Minerals (ICAPS MV PO) Take 1 tablet by mouth daily.   Yes Historical Provider, MD  omega-3 acid ethyl esters (LOVAZA) 1 G capsule Take 1 g by mouth 2 (two) times daily.    Yes Historical Provider, MD  traMADol (ULTRAM) 50 MG tablet Take 50 mg by mouth every 6 (six) hours as needed for moderate pain.    Yes Historical Provider, MD   BP 124/66  Pulse 76  Temp(Src) 98 F (36.7 C) (Oral)  Resp 18  SpO2 98% Physical Exam  Nursing note and vitals reviewed. Constitutional: She is oriented to person, place, and time. She appears well-developed and well-nourished.  HENT:  Head: Normocephalic and atraumatic.  Eyes: Conjunctivae and EOM are normal. Pupils are equal,  round, and reactive to light.  Neck: Normal range of motion. Neck supple.  Cardiovascular: Normal rate, regular rhythm and intact distal pulses.  Exam reveals no gallop and no friction rub.   No murmur heard. Intact distal pulses, with brisk capillary refill  Pulmonary/Chest: Effort normal and breath sounds normal. No respiratory distress. She has no wheezes. She has no rales. She exhibits no tenderness.  Abdominal: Soft. Bowel sounds are normal. She exhibits no distension and no mass. There is no tenderness. There is no rebound and no guarding.    Musculoskeletal: Normal range of motion. She exhibits no edema and no tenderness.  Range of motion strength 5/5, no unilateral leg swelling, no evidence of DVT, no evidence of cellulitis or septic joint  Neurological: She is alert and oriented to person, place, and time.  Sensation intact  Skin: Skin is warm and dry.     Contusion to left anterior shin  Psychiatric: She has a normal mood and affect. Her behavior is normal. Judgment and thought content normal.    ED Course  Procedures (including critical care time) Labs Review Labs Reviewed - No data to display  Imaging Review No results found.   EKG Interpretation None      MDM   Final diagnoses:  Contusion    Patient with contusion. No evidence of DVT. Patient seen by and discussed with Dr. Deretha Emory , who agrees with the plan. Patient is stable and ready for discharge to home.    Roxy Horseman, PA-C 08/13/13 2048

## 2013-08-13 NOTE — ED Notes (Signed)
She noticed yesterday she has bruising/bleeding to L outer ankle/calf area. She denies any injuries. She did fall onto this leg in February and was evaluated for injuries at that time. Cms intact distal to bruising.

## 2013-10-22 ENCOUNTER — Ambulatory Visit (INDEPENDENT_AMBULATORY_CARE_PROVIDER_SITE_OTHER): Payer: Medicare Other | Admitting: Podiatrist

## 2013-10-22 DIAGNOSIS — Q828 Other specified congenital malformations of skin: Secondary | ICD-10-CM

## 2013-10-22 DIAGNOSIS — M216X9 Other acquired deformities of unspecified foot: Secondary | ICD-10-CM

## 2013-10-22 DIAGNOSIS — M216X2 Other acquired deformities of left foot: Secondary | ICD-10-CM

## 2013-10-22 NOTE — Patient Instructions (Signed)
Corns and Calluses Corns are small areas of thickened skin that usually occur on the top, sides, or tip of a toe. They contain a cone-shaped core with a point that can press on a nerve below. This causes pain. Calluses are areas of thickened skin that usually develop on hands, fingers, palms, soles of the feet, and heels. These are areas that experience frequent friction or pressure. CAUSES  Corns are usually the result of rubbing (friction) or pressure from shoes that are too tight or do not fit properly. Calluses are caused by repeated friction and pressure on the affected areas. SYMPTOMS  A hard growth on the skin.  Pain or tenderness under the skin.  Sometimes, redness and swelling.  Increased discomfort while wearing tight-fitting shoes. DIAGNOSIS  Your caregiver can usually tell what the problem is by doing a physical exam. TREATMENT  Removing the cause of the friction or pressure is usually the only treatment needed. However, sometimes medicines can be used to help soften the hardened, thickened areas. These medicines include salicylic acid plasters and 12% ammonium lactate lotion. These medicines should only be used under the direction of your caregiver. HOME CARE INSTRUCTIONS   Try to remove pressure from the affected area.  You may wear donut-shaped corn pads to protect your skin.  You may use a pumice stone or nonmetallic nail file to gently reduce the thickness of a corn.  Wear properly fitted footwear.  If you have calluses on the hands, wear gloves during activities that cause friction.  If you have diabetes, you should regularly examine your feet. Tell your caregiver if you notice any problems with your feet. SEEK IMMEDIATE MEDICAL CARE IF:   You have increased pain, swelling, redness, or warmth in the affected area.  Your corn or callus starts to drain fluid or bleeds.  You are not getting better, even with treatment. Document Released: 01/20/2004 Document  Revised: 07/08/2011 Document Reviewed: 12/11/2010 [employer] Patient Information 2015 [employer]. This information is not intended to replace advice given to you by your health care provider. Make sure you discuss any questions you have with your health care provider.  

## 2013-10-22 NOTE — Progress Notes (Signed)
Chief Complaint  Patient presents with  . Callouses    "I hope cut the callous off my foot." left 2nd MPJ area plantar     HPI: Patient is 80 y.o. female who presents today for painful callus submet 2 of the left foot.  She also has a hammertoe contracture of the lesser digits bilateral.       Physical Exam GENERAL APPEARANCE: Alert, conversant. Appropriately groomed. No acute distress.  VASCULAR: Pedal pulses palpable at 2/4 DP and PT bilateral.  Capillary refill time is immediate to all digits,  Proximal to distal temperature is warm to warm.  Digital hair growth is present bilateral  NEUROLOGIC: sensation is intact epicritically and protectively to 5.07 monofilament at 5/5 sites bilateral.  Light touch is intact bilateral, vibratory sensation intact bilateral, achilles tendon reflex is intact bilateral.  MUSCULOSKELETAL: acceptable muscle strength, tone and stability bilateral.  Contracture of lesser digits is present left foot.  Prominent, and plantar flexed 2nd metatarsal is present left. DERMATOLOGIC: skin color, texture, and turger are within normal limits.  Large painful callus is present submet 2 of the left foot.  There is ground glass appearance and intact integument is present post debridement.  Assessment: porokeratotic lesion/ callus submet 2 left foot  Plan:  Debridement of the lesion accomplished today without complication. She will return as needed for follow up.

## 2013-11-26 ENCOUNTER — Ambulatory Visit (INDEPENDENT_AMBULATORY_CARE_PROVIDER_SITE_OTHER): Payer: Medicare Other | Admitting: Podiatrist

## 2013-11-26 DIAGNOSIS — M216X9 Other acquired deformities of unspecified foot: Secondary | ICD-10-CM

## 2013-11-26 DIAGNOSIS — M216X2 Other acquired deformities of left foot: Secondary | ICD-10-CM

## 2013-11-26 DIAGNOSIS — M7150 Other bursitis, not elsewhere classified, unspecified site: Secondary | ICD-10-CM

## 2013-11-26 MED ORDER — DEXAMETHASONE SODIUM PHOSPHATE 120 MG/30ML IJ SOLN
4.0000 mg | Freq: Once | INTRAMUSCULAR | Status: AC
  Administered 2013-11-26: 4 mg via INTRA_ARTICULAR

## 2013-11-26 NOTE — Progress Notes (Signed)
Chief Complaint  Patient presents with  . Callouses    "That place where the callous was trimmed is sore."  left 2nd MPJ plantar area     HPI: Patient is 80 y.o. female who presents today for continued pain submet 2 of the left foot.  She relates she had minimal improvement after her last visit where I debrided the callus.  She would like to know how to get rid of the callus like we did on the right foot.   Physical Exam  Neurovascular status unchanged and intact.  Severe contracture of digits 1-5 left is noted with prominent metatarsal heads present.  Large callus is present submet 2 left with surround soft tissue inflammation present  Assessment: Prominent and plantarflexed metatarsals left, porokeratosis x1, bursitis  Plan: Lang: Injected the inflamed area submetatarsal 2 with dexamethasone and Marcaine mixture without complication. Agreed the callus down as far as possible without complication. Applied an offloading pad. She'll be seen back as needed for followup. I discussed there is no way to get this to go away completely due to her for deformity and gait pattern. We can try an insert for her shoe if the callus continues to return.

## 2014-01-07 ENCOUNTER — Ambulatory Visit (INDEPENDENT_AMBULATORY_CARE_PROVIDER_SITE_OTHER): Payer: Medicare Other | Admitting: Podiatrist

## 2014-01-07 DIAGNOSIS — M216X2 Other acquired deformities of left foot: Secondary | ICD-10-CM

## 2014-01-07 DIAGNOSIS — M216X9 Other acquired deformities of unspecified foot: Secondary | ICD-10-CM

## 2014-01-07 DIAGNOSIS — Q828 Other specified congenital malformations of skin: Secondary | ICD-10-CM

## 2014-01-07 NOTE — Progress Notes (Signed)
Chief Complaint   Patient presents with   .  Callouses        HPI: Patient is 80 y.o. female who presents today for painful callus submet 2 and 3 of the left foot. She also has a hammertoe contracture of the lesser digits bilateral.   Physical Exam  Neurovascular status is intact and unchanged to the left foot. Continued Contracture of lesser digits is present left foot. Prominent, and plantar flexed 2nd and third metatarsal is present left.  DERMATOLOGIC:Large painful callus is present submet 2 of the left foot. There is ground glass appearance and intact integument is present post debridement. There is also dry hemorrhagic tissue present submetatarsal 3 of the left foot are apparent friction rub. Assessment: porokeratotic lesion/ callus submet 2, 3 left foot   Plan: Debridement of the lesion x2 accomplished today without complication. I explained to her that the lesions are going to keep returning if she wears thin or ill fitting shoes. Today I applied padding to offload. She will return as needed for follow up.

## 2014-01-27 ENCOUNTER — Ambulatory Visit (INDEPENDENT_AMBULATORY_CARE_PROVIDER_SITE_OTHER): Payer: Medicare Other | Admitting: Podiatrist

## 2014-01-27 DIAGNOSIS — I872 Venous insufficiency (chronic) (peripheral): Secondary | ICD-10-CM

## 2014-01-27 DIAGNOSIS — M069 Rheumatoid arthritis, unspecified: Secondary | ICD-10-CM

## 2014-01-27 DIAGNOSIS — Q828 Other specified congenital malformations of skin: Secondary | ICD-10-CM

## 2014-01-27 DIAGNOSIS — M216X2 Other acquired deformities of left foot: Secondary | ICD-10-CM

## 2014-01-27 NOTE — Progress Notes (Signed)
HPI: Patient is 80 y.o. female who presents today for painful callus submet 2 and 3 of the left foot. She also has a hammertoe contracture of the lesser digits bilateral.  Physical Exam  Neurovascular status is intact and unchanged to the left foot. Continued Contracture of lesser digits is present left foot. Prominent, and plantar flexed 2nd and third metatarsal is present left.  DERMATOLOGIC:Large painful callus is present submet 2 of the left foot. There is ground glass appearance and intact integument is present post debridement. There is also dry hemorrhagic tissue present submetatarsal 3 of the left foot are apparent friction rub.  Assessment: porokeratotic lesion/ callus submet 2, 3 left foot  Plan: Debridement of the lesion x2 accomplished today without complication. I again explained to her that the lesions are going to keep returning due to her foot being shaped the way it is from rheumatoid arthritis.  Today we tried a silicone forefoot pad and she finally gets that the calluses will be with her forever.   She will return as needed for follow up.

## 2015-05-03 ENCOUNTER — Other Ambulatory Visit (HOSPITAL_COMMUNITY): Payer: Self-pay | Admitting: *Deleted

## 2015-05-04 ENCOUNTER — Inpatient Hospital Stay (HOSPITAL_COMMUNITY): Admission: RE | Admit: 2015-05-04 | Payer: Self-pay | Source: Ambulatory Visit

## 2016-04-13 ENCOUNTER — Emergency Department (HOSPITAL_COMMUNITY): Payer: Medicare Other

## 2016-04-13 ENCOUNTER — Inpatient Hospital Stay (HOSPITAL_COMMUNITY): Payer: Medicare Other

## 2016-04-13 ENCOUNTER — Inpatient Hospital Stay (HOSPITAL_COMMUNITY): Payer: Medicare Other | Admitting: Anesthesiology

## 2016-04-13 ENCOUNTER — Encounter (HOSPITAL_COMMUNITY)
Admission: EM | Disposition: A | Discharge status: Skilled Nursing Facility | Payer: Self-pay | Source: Home / Self Care | Attending: Family Medicine

## 2016-04-13 ENCOUNTER — Encounter (HOSPITAL_COMMUNITY): Payer: Self-pay | Admitting: *Deleted

## 2016-04-13 ENCOUNTER — Inpatient Hospital Stay (HOSPITAL_COMMUNITY)
Admission: EM | Admit: 2016-04-13 | Discharge: 2016-04-16 | DRG: 470 | Disposition: A | Discharge status: Skilled Nursing Facility | Payer: Medicare Other | Attending: Family Medicine | Admitting: Family Medicine

## 2016-04-13 DIAGNOSIS — M069 Rheumatoid arthritis, unspecified: Secondary | ICD-10-CM | POA: Diagnosis present

## 2016-04-13 DIAGNOSIS — F17210 Nicotine dependence, cigarettes, uncomplicated: Secondary | ICD-10-CM | POA: Diagnosis present

## 2016-04-13 DIAGNOSIS — W010XXA Fall on same level from slipping, tripping and stumbling without subsequent striking against object, initial encounter: Secondary | ICD-10-CM | POA: Diagnosis present

## 2016-04-13 DIAGNOSIS — R296 Repeated falls: Secondary | ICD-10-CM | POA: Diagnosis present

## 2016-04-13 DIAGNOSIS — R262 Difficulty in walking, not elsewhere classified: Secondary | ICD-10-CM

## 2016-04-13 DIAGNOSIS — S2232XA Fracture of one rib, left side, initial encounter for closed fracture: Secondary | ICD-10-CM | POA: Diagnosis present

## 2016-04-13 DIAGNOSIS — S72002A Fracture of unspecified part of neck of left femur, initial encounter for closed fracture: Principal | ICD-10-CM | POA: Diagnosis present

## 2016-04-13 DIAGNOSIS — Z79899 Other long term (current) drug therapy: Secondary | ICD-10-CM

## 2016-04-13 DIAGNOSIS — S42032A Displaced fracture of lateral end of left clavicle, initial encounter for closed fracture: Secondary | ICD-10-CM | POA: Diagnosis present

## 2016-04-13 DIAGNOSIS — Z7982 Long term (current) use of aspirin: Secondary | ICD-10-CM

## 2016-04-13 DIAGNOSIS — W19XXXA Unspecified fall, initial encounter: Secondary | ICD-10-CM

## 2016-04-13 HISTORY — PX: HIP ARTHROPLASTY: SHX981

## 2016-04-13 LAB — BASIC METABOLIC PANEL
ANION GAP: 12 (ref 5–15)
BUN: 14 mg/dL (ref 6–20)
CALCIUM: 9.5 mg/dL (ref 8.9–10.3)
CHLORIDE: 106 mmol/L (ref 101–111)
CO2: 22 mmol/L (ref 22–32)
Creatinine, Ser: 0.94 mg/dL (ref 0.44–1.00)
GFR calc non Af Amer: 51 mL/min — ABNORMAL LOW (ref >60)
GFR, EST AFRICAN AMERICAN: 60 mL/min — AB (ref >60)
Glucose, Bld: 86 mg/dL (ref 65–99)
Potassium: 3.9 mmol/L (ref 3.5–5.1)
SODIUM: 140 mmol/L (ref 135–145)

## 2016-04-13 LAB — CBC WITH DIFFERENTIAL/PLATELET
BASOS ABS: 0.1 10*3/uL (ref 0.0–0.1)
BASOS PCT: 1 %
Eosinophils Absolute: 0.1 10*3/uL (ref 0.0–0.7)
Eosinophils Relative: 2 %
HEMATOCRIT: 45 % (ref 36.0–46.0)
HEMOGLOBIN: 15 g/dL (ref 12.0–15.0)
Lymphocytes Relative: 33 %
Lymphs Abs: 2.7 10*3/uL (ref 0.7–4.0)
MCH: 32.1 pg (ref 26.0–34.0)
MCHC: 33.3 g/dL (ref 30.0–36.0)
MCV: 96.4 fL (ref 78.0–100.0)
MONOS PCT: 11 %
Monocytes Absolute: 0.9 10*3/uL (ref 0.1–1.0)
NEUTROS ABS: 4.3 10*3/uL (ref 1.7–7.7)
NEUTROS PCT: 53 %
Platelets: 249 10*3/uL (ref 150–400)
RBC: 4.67 MIL/uL (ref 3.87–5.11)
RDW: 14.5 % (ref 11.5–15.5)
WBC: 8 10*3/uL (ref 4.0–10.5)

## 2016-04-13 LAB — PROTIME-INR
INR: 1.01
Prothrombin Time: 13.4 seconds (ref 11.4–15.2)

## 2016-04-13 SURGERY — HEMIARTHROPLASTY, HIP, DIRECT ANTERIOR APPROACH, FOR FRACTURE
Anesthesia: General | Site: Hip | Laterality: Left

## 2016-04-13 MED ORDER — LIDOCAINE HCL (CARDIAC) 20 MG/ML IV SOLN
INTRAVENOUS | Status: DC | PRN
  Administered 2016-04-13: 60 mg via INTRATRACHEAL

## 2016-04-13 MED ORDER — PHENYLEPHRINE HCL 10 MG/ML IJ SOLN
INTRAMUSCULAR | Status: AC
  Filled 2016-04-13: qty 1

## 2016-04-13 MED ORDER — SODIUM CHLORIDE 0.9 % IR SOLN
Status: DC | PRN
  Administered 2016-04-13: 1000 mL

## 2016-04-13 MED ORDER — CEFAZOLIN SODIUM 1 G IJ SOLR
INTRAMUSCULAR | Status: AC
  Filled 2016-04-13: qty 20

## 2016-04-13 MED ORDER — VITAMIN D 1000 UNITS PO TABS
1000.0000 [IU] | ORAL_TABLET | Freq: Every day | ORAL | Status: DC
  Administered 2016-04-14 – 2016-04-16 (×3): 1000 [IU] via ORAL
  Filled 2016-04-13 (×3): qty 1

## 2016-04-13 MED ORDER — ENOXAPARIN SODIUM 40 MG/0.4ML ~~LOC~~ SOLN: 40.0000 mg | SUBCUTANEOUS | 0 refills | Status: DC

## 2016-04-13 MED ORDER — FENTANYL CITRATE (PF) 100 MCG/2ML IJ SOLN
INTRAMUSCULAR | Status: AC
  Filled 2016-04-13: qty 2

## 2016-04-13 MED ORDER — HYDROCODONE-ACETAMINOPHEN 5-325 MG PO TABS: 0.5000 | ORAL_TABLET | Freq: Four times a day (QID) | ORAL | 0 refills | Status: DC | PRN

## 2016-04-13 MED ORDER — FENTANYL CITRATE (PF) 100 MCG/2ML IJ SOLN
25.0000 ug | INTRAMUSCULAR | Status: DC | PRN
  Administered 2016-04-13 (×2): 50 ug via INTRAVENOUS

## 2016-04-13 MED ORDER — PHENYLEPHRINE 40 MCG/ML (10ML) SYRINGE FOR IV PUSH (FOR BLOOD PRESSURE SUPPORT)
PREFILLED_SYRINGE | INTRAVENOUS | Status: AC
  Filled 2016-04-13: qty 20

## 2016-04-13 MED ORDER — OXYCODONE HCL 5 MG PO TABS: 5.0000 mg | ORAL_TABLET | Freq: Once | ORAL | Status: DC | PRN

## 2016-04-13 MED ORDER — FOLIC ACID 1 MG PO TABS
1.0000 mg | ORAL_TABLET | Freq: Every day | ORAL | Status: DC
  Administered 2016-04-14 – 2016-04-16 (×3): 1 mg via ORAL
  Filled 2016-04-13 (×3): qty 1

## 2016-04-13 MED ORDER — CEFAZOLIN SODIUM-DEXTROSE 2-4 GM/100ML-% IV SOLN
2.0000 g | Freq: Four times a day (QID) | INTRAVENOUS | Status: AC
  Administered 2016-04-14 (×2): 2 g via INTRAVENOUS
  Filled 2016-04-13 (×3): qty 100

## 2016-04-13 MED ORDER — LACTATED RINGERS IV SOLN
INTRAVENOUS | Status: DC | PRN
  Administered 2016-04-13 (×2): via INTRAVENOUS

## 2016-04-13 MED ORDER — PHENYLEPHRINE HCL 10 MG/ML IJ SOLN
INTRAVENOUS | Status: DC | PRN
  Administered 2016-04-13: 25 ug/min via INTRAVENOUS

## 2016-04-13 MED ORDER — OMEGA-3-ACID ETHYL ESTERS 1 G PO CAPS
1.0000 g | ORAL_CAPSULE | Freq: Two times a day (BID) | ORAL | Status: DC
  Administered 2016-04-15 – 2016-04-16 (×3): 1 g via ORAL
  Filled 2016-04-13 (×5): qty 1

## 2016-04-13 MED ORDER — SUCCINYLCHOLINE CHLORIDE 20 MG/ML IJ SOLN
INTRAMUSCULAR | Status: DC | PRN
  Administered 2016-04-13: 100 mg via INTRAVENOUS

## 2016-04-13 MED ORDER — OXYCODONE HCL 5 MG/5ML PO SOLN: 5.0000 mg | Freq: Once | ORAL | Status: DC | PRN

## 2016-04-13 MED ORDER — METHOTREXATE 2.5 MG PO TABS
2.5000 mg | ORAL_TABLET | ORAL | Status: DC
  Filled 2016-04-13: qty 1

## 2016-04-13 MED ORDER — EPHEDRINE 5 MG/ML INJ
INTRAVENOUS | Status: AC
  Filled 2016-04-13: qty 20

## 2016-04-13 MED ORDER — LIDOCAINE 2% (20 MG/ML) 5 ML SYRINGE
INTRAMUSCULAR | Status: AC
  Filled 2016-04-13: qty 15

## 2016-04-13 MED ORDER — PROPOFOL 10 MG/ML IV BOLUS
INTRAVENOUS | Status: DC | PRN
  Administered 2016-04-13: 80 mg via INTRAVENOUS

## 2016-04-13 MED ORDER — ONDANSETRON HCL 4 MG/2ML IJ SOLN: 4.0000 mg | Freq: Four times a day (QID) | INTRAMUSCULAR | Status: DC | PRN

## 2016-04-13 MED ORDER — ONDANSETRON HCL 4 MG/2ML IJ SOLN
INTRAMUSCULAR | Status: AC
  Filled 2016-04-13: qty 6

## 2016-04-13 MED ORDER — FENTANYL CITRATE (PF) 250 MCG/5ML IJ SOLN
INTRAMUSCULAR | Status: DC | PRN
  Administered 2016-04-13 (×4): 50 ug via INTRAVENOUS

## 2016-04-13 MED ORDER — SUGAMMADEX SODIUM 200 MG/2ML IV SOLN
INTRAVENOUS | Status: DC | PRN
  Administered 2016-04-13: 200 mg via INTRAVENOUS

## 2016-04-13 MED ORDER — ARTIFICIAL TEARS OP OINT
TOPICAL_OINTMENT | OPHTHALMIC | Status: AC
  Filled 2016-04-13: qty 7

## 2016-04-13 MED ORDER — MORPHINE SULFATE (PF) 4 MG/ML IV SOLN: 0.5000 mg | INTRAVENOUS | Status: DC | PRN

## 2016-04-13 MED ORDER — PROPOFOL 10 MG/ML IV BOLUS
INTRAVENOUS | Status: AC
  Filled 2016-04-13: qty 20

## 2016-04-13 MED ORDER — ONDANSETRON HCL 4 MG/2ML IJ SOLN
INTRAMUSCULAR | Status: DC | PRN
  Administered 2016-04-13: 4 mg via INTRAVENOUS

## 2016-04-13 MED ORDER — ATORVASTATIN CALCIUM 10 MG PO TABS
20.0000 mg | ORAL_TABLET | Freq: Every day | ORAL | Status: DC
  Administered 2016-04-14 – 2016-04-15 (×2): 20 mg via ORAL
  Filled 2016-04-13 (×2): qty 2

## 2016-04-13 MED ORDER — FENTANYL CITRATE (PF) 100 MCG/2ML IJ SOLN
25.0000 ug | Freq: Once | INTRAMUSCULAR | Status: AC
  Administered 2016-04-13: 25 ug via INTRAVENOUS
  Filled 2016-04-13: qty 2

## 2016-04-13 MED ORDER — ROCURONIUM BROMIDE 100 MG/10ML IV SOLN
INTRAVENOUS | Status: DC | PRN
  Administered 2016-04-13: 20 mg via INTRAVENOUS

## 2016-04-13 MED ORDER — HYDROCODONE-ACETAMINOPHEN 5-325 MG PO TABS: 1.0000 | ORAL_TABLET | Freq: Four times a day (QID) | ORAL | Status: DC | PRN

## 2016-04-13 MED ORDER — SODIUM CHLORIDE 0.9 % IV SOLN
INTRAVENOUS | Status: DC
  Administered 2016-04-14: via INTRAVENOUS

## 2016-04-13 MED ORDER — ROCURONIUM BROMIDE 50 MG/5ML IV SOSY
PREFILLED_SYRINGE | INTRAVENOUS | Status: AC
  Filled 2016-04-13: qty 30

## 2016-04-13 MED ORDER — SUGAMMADEX SODIUM 200 MG/2ML IV SOLN
INTRAVENOUS | Status: AC
  Filled 2016-04-13: qty 2

## 2016-04-13 MED ORDER — CEFAZOLIN SODIUM-DEXTROSE 2-3 GM-% IV SOLR
INTRAVENOUS | Status: DC | PRN
  Administered 2016-04-13: 2 g via INTRAVENOUS

## 2016-04-13 MED ORDER — SUGAMMADEX SODIUM 500 MG/5ML IV SOLN
INTRAVENOUS | Status: AC
  Filled 2016-04-13: qty 5

## 2016-04-13 MED ORDER — SUCCINYLCHOLINE CHLORIDE 200 MG/10ML IV SOSY
PREFILLED_SYRINGE | INTRAVENOUS | Status: AC
  Filled 2016-04-13: qty 20

## 2016-04-13 SURGICAL SUPPLY — 56 items
BLADE SAW SAG 73X25 THK (BLADE) ×2
BLADE SAW SGTL 73X25 THK (BLADE) ×1 IMPLANT
BRUSH FEMORAL CANAL (MISCELLANEOUS) IMPLANT
CABLE WITH CRIMP (Cable) ×3 IMPLANT
CAPT HIP HEMI 1 ×3 IMPLANT
CEMENT HV SMART SET (Cement) ×6 IMPLANT
CEMENT RESTRICTOR DEPUY SZ 3 (Cement) ×3 IMPLANT
CLOSURE WOUND 1/2 X4 (GAUZE/BANDAGES/DRESSINGS)
COVER SURGICAL LIGHT HANDLE (MISCELLANEOUS) ×3 IMPLANT
DRAPE IMP U-DRAPE 54X76 (DRAPES) ×3 IMPLANT
DRAPE INCISE IOBAN 66X45 STRL (DRAPES) ×6 IMPLANT
DRAPE ORTHO SPLIT 77X108 STRL (DRAPES) ×4
DRAPE SURG ORHT 6 SPLT 77X108 (DRAPES) ×2 IMPLANT
DRAPE U-SHAPE 47X51 STRL (DRAPES) ×9 IMPLANT
DRSG AQUACEL AG ADV 3.5X10 (GAUZE/BANDAGES/DRESSINGS) ×3 IMPLANT
DURAPREP 26ML APPLICATOR (WOUND CARE) ×3 IMPLANT
ELECT BLADE 4.0 EZ CLEAN MEGAD (MISCELLANEOUS)
ELECT REM PT RETURN 9FT ADLT (ELECTROSURGICAL) ×3
ELECTRODE BLDE 4.0 EZ CLN MEGD (MISCELLANEOUS) IMPLANT
ELECTRODE REM PT RTRN 9FT ADLT (ELECTROSURGICAL) ×1 IMPLANT
GLOVE BIOGEL PI ORTHO PRO 7.5 (GLOVE) ×2
GLOVE BIOGEL PI ORTHO PRO SZ8 (GLOVE) ×2
GLOVE ORTHO TXT STRL SZ7.5 (GLOVE) ×3 IMPLANT
GLOVE PI ORTHO PRO STRL 7.5 (GLOVE) ×1 IMPLANT
GLOVE PI ORTHO PRO STRL SZ8 (GLOVE) ×1 IMPLANT
GLOVE SURG ORTHO 8.5 STRL (GLOVE) ×3 IMPLANT
GOWN STRL REUS W/ TWL LRG LVL3 (GOWN DISPOSABLE) ×1 IMPLANT
GOWN STRL REUS W/ TWL XL LVL3 (GOWN DISPOSABLE) ×2 IMPLANT
GOWN STRL REUS W/TWL LRG LVL3 (GOWN DISPOSABLE) ×2
GOWN STRL REUS W/TWL XL LVL3 (GOWN DISPOSABLE) ×4
HANDPIECE INTERPULSE COAX TIP (DISPOSABLE) ×2
KIT BASIN OR (CUSTOM PROCEDURE TRAY) ×3 IMPLANT
KIT ROOM TURNOVER OR (KITS) ×3 IMPLANT
MANIFOLD NEPTUNE II (INSTRUMENTS) ×3 IMPLANT
NS IRRIG 1000ML POUR BTL (IV SOLUTION) ×3 IMPLANT
PACK TOTAL JOINT (CUSTOM PROCEDURE TRAY) ×3 IMPLANT
PACK UNIVERSAL I (CUSTOM PROCEDURE TRAY) ×3 IMPLANT
PAD ARMBOARD 7.5X6 YLW CONV (MISCELLANEOUS) ×6 IMPLANT
SET HNDPC FAN SPRY TIP SCT (DISPOSABLE) ×1 IMPLANT
STAPLER VISISTAT 35W (STAPLE) ×3 IMPLANT
STEM TRI LOC GRIPTION SZ 2 STD ×1 IMPLANT
STRIP CLOSURE SKIN 1/2X4 (GAUZE/BANDAGES/DRESSINGS) IMPLANT
SUT ETHIBOND NAB CT1 #1 30IN (SUTURE) ×9 IMPLANT
SUT MNCRL AB 3-0 PS2 18 (SUTURE) ×6 IMPLANT
SUT VIC AB 0 CT1 27 (SUTURE) ×2
SUT VIC AB 0 CT1 27XBRD ANBCTR (SUTURE) ×1 IMPLANT
SUT VIC AB 1 CT1 27 (SUTURE) ×2
SUT VIC AB 1 CT1 27XBRD ANBCTR (SUTURE) ×1 IMPLANT
SUT VIC AB 2-0 CT1 27 (SUTURE) ×2
SUT VIC AB 2-0 CT1 TAPERPNT 27 (SUTURE) ×1 IMPLANT
TOWEL OR 17X24 6PK STRL BLUE (TOWEL DISPOSABLE) ×3 IMPLANT
TOWEL OR 17X26 10 PK STRL BLUE (TOWEL DISPOSABLE) ×3 IMPLANT
TOWER CARTRIDGE SMART MIX (DISPOSABLE) IMPLANT
TRAY FOLEY CATH 16FRSI W/METER (SET/KITS/TRAYS/PACK) IMPLANT
TRI LOC GRIPTION SZ 2 STD ×3 IMPLANT
WATER STERILE IRR 1000ML POUR (IV SOLUTION) ×9 IMPLANT

## 2016-04-13 NOTE — H&P (Signed)
History and Physical    [name] [name] SFK:[id number] DOB: 05-Jul-1922 DOA: 04/13/2016   PCP: Alysia Penna, MD Chief Complaint:  Chief Complaint  Patient presents with  . Fall  . Hip Pain    HPI: [name] [name] is a 82 y.o. female with medical history significant of RA.  Lives independently in condo at baseline.  Today she had mechanical fall and injured left hip.  Immediate L hip pain after fall, unable to bear weight.  Pain worse with movement, better at rest.  Symptoms persistent.  ED Course: L femoral neck fx.  Dr. Ranell Patrick has already seen patient and is planning on OR tonight.  Review of Systems: As per HPI otherwise 10 point review of systems negative.    Past Medical History:  Diagnosis Date  . Arthritis   . Cataract   . Glaucoma     Past Surgical History:  Procedure Laterality Date  . EYE SURGERY    . right hand surgery       reports that she has been smoking Cigarettes.  She has been smoking about 0.25 packs per day. She has never used smokeless tobacco. She reports that she does not drink alcohol or use drugs.  No Known Allergies  History reviewed. No pertinent family history. No mention of hip fractures in family.   Prior to Admission medications   Medication Sig Start Date End Date Taking? Authorizing Provider  aspirin 81 MG tablet Take 81 mg by mouth daily.    Historical Provider, MD  atorvastatin (LIPITOR) 20 MG tablet Take 20 mg by mouth daily.  05/13/13   Historical Provider, MD  cholecalciferol (VITAMIN D) 1000 UNITS tablet Take 1,000 Units by mouth daily.    Historical Provider, MD  denosumab (PROLIA) 60 MG/ML SOLN injection Inject 60 mg into the skin every 6 (six) months. Administer in upper arm, thigh, or abdomen    Historical Provider, MD  folic acid (FOLVITE) 1 MG tablet Take 1 mg by mouth daily.  05/13/13   Historical Provider, MD  methotrexate (RHEUMATREX) 2.5 MG tablet Take 2.5 mg by mouth once a week. Caution:Chemotherapy. Protect from light.     Historical Provider, MD  Multiple Vitamins-Minerals (ICAPS MV PO) Take 1 tablet by mouth daily.    Historical Provider, MD  omega-3 acid ethyl esters (LOVAZA) 1 G capsule Take 1 g by mouth 2 (two) times daily.     Historical Provider, MD  traMADol (ULTRAM) 50 MG tablet Take 50 mg by mouth every 6 (six) hours as needed for moderate pain.     Historical Provider, MD    Physical Exam: Vitals:   04/13/16 1745 04/13/16 1838 04/13/16 1845 04/13/16 1900  BP: 141/88 126/77 157/92 141/87  Pulse: 86 81 78 78  Resp: 17 (!) 96 19 17  Temp:      TempSrc:      SpO2: 95% 95% 95% 93%  Weight:      Height:          Constitutional: NAD, calm, comfortable Eyes: PERRL, lids and conjunctivae normal ENMT: Mucous membranes are moist. Posterior pharynx clear of any exudate or lesions.Normal dentition.  Neck: normal, supple, no masses, no thyromegaly Respiratory: clear to auscultation bilaterally, no wheezing, no crackles. Normal respiratory effort. No accessory muscle use.  Cardiovascular: Regular rate and rhythm, no murmurs / rubs / gallops. No extremity edema. 2+ pedal pulses. No carotid bruits.  Abdomen: no tenderness, no masses palpated. No hepatosplenomegaly. Bowel sounds positive.  Musculoskeletal: no clubbing /  cyanosis. No joint deformity upper and lower extremities. Good ROM, no contractures. Normal muscle tone.  Skin: no rashes, lesions, ulcers. No induration Neurologic: CN 2-12 grossly intact. Sensation intact, DTR normal. Strength 5/5 in all 4.  Psychiatric: Normal judgment and insight. Alert and oriented x 3. Normal mood.    Labs on Admission: I have personally reviewed following labs and imaging studies  CBC:  Recent Labs Lab 04/13/16 1727  WBC 8.0  NEUTROABS 4.3  HGB 15.0  HCT 45.0  MCV 96.4  PLT 249   Basic Metabolic Panel:  Recent Labs Lab 04/13/16 1727  NA 140  K 3.9  CL 106  CO2 22  GLUCOSE 86  BUN 14  CREATININE 0.94  CALCIUM 9.5   GFR: Estimated Creatinine  Clearance: 28.5 mL/min (by C-G formula based on SCr of 0.94 mg/dL). Liver Function Tests: No results for input(s): AST, ALT, ALKPHOS, BILITOT, PROT, ALBUMIN in the last 168 hours. No results for input(s): LIPASE, AMYLASE in the last 168 hours. No results for input(s): AMMONIA in the last 168 hours. Coagulation Profile:  Recent Labs Lab 04/13/16 1727  INR 1.01   Cardiac Enzymes: No results for input(s): CKTOTAL, CKMB, CKMBINDEX, TROPONINI in the last 168 hours. BNP (last 3 results) No results for input(s): PROBNP in the last 8760 hours. HbA1C: No results for input(s): HGBA1C in the last 72 hours. CBG: No results for input(s): GLUCAP in the last 168 hours. Lipid Profile: No results for input(s): CHOL, HDL, LDLCALC, TRIG, CHOLHDL, LDLDIRECT in the last 72 hours. Thyroid Function Tests: No results for input(s): TSH, T4TOTAL, FREET4, T3FREE, THYROIDAB in the last 72 hours. Anemia Panel: No results for input(s): VITAMINB12, FOLATE, FERRITIN, TIBC, IRON, RETICCTPCT in the last 72 hours. Urine analysis: No results found for: COLORURINE, APPEARANCEUR, LABSPEC, PHURINE, GLUCOSEU, HGBUR, BILIRUBINUR, KETONESUR, PROTEINUR, UROBILINOGEN, NITRITE, LEUKOCYTESUR Sepsis Labs: @LABRCNTIP (procalcitonin:4,lacticidven:4) )No results found for this or any previous visit (from the past 240 hour(s)).   Radiological Exams on Admission: Dg Pelvis 1-2 Views  Result Date: 04/13/2016 CLINICAL DATA:  Pain after fall EXAM: PELVIS - 1-2 VIEW COMPARISON:  None FINDINGS: There is an oblique fracture through the left femoral neck with displacement. IMPRESSION: Oblique fracture through the left femoral neck with displacement. No dislocation. Electronically Signed   By: 04/15/2016 III M.D   On: 04/13/2016 18:49   Ct Head Wo Contrast  Result Date: 04/13/2016 CLINICAL DATA:  Initial evaluation for acute trauma, fall. Patient with left hip fracture. EXAM: CT HEAD WITHOUT CONTRAST TECHNIQUE: Contiguous axial  images were obtained from the base of the skull through the vertex without intravenous contrast. COMPARISON:  Previous MRI from 11/25/2004. FINDINGS: Brain: Generalized age-related cerebral atrophy. Patchy and confluent hypodensity within the periventricular and deep white matter both cerebral hemispheres most consistent with chronic microvascular ischemic changes. Remote lacunar infarct within the left basal ganglia. No acute intracranial hemorrhage. No evidence for acute large vessel territory infarct. No mass lesion, midline shift or mass effect. Ventricular prominence related to global parenchymal volume loss without hydrocephalus. No extra-axial fluid collection. Vascular: No hyperdense vessel. Prominent intracranial atherosclerosis noted. Skull: Scalp soft tissues demonstrate no acute abnormality. Calvarium intact. Sinuses/Orbits: Globes and orbital soft tissues demonstrate no acute abnormality. Patient is status post cataract extraction bilaterally. Paranasal sinuses are clear. No mastoid effusion. IMPRESSION: 1. No acute intracranial process identified. 2. Generalized age-related cerebral atrophy with moderate chronic microvascular ischemic disease. Electronically Signed   By: 11/27/2004 M.D.   On: 04/13/2016 19:54  Dg Chest Port 1 View  Result Date: 04/13/2016 CLINICAL DATA:  Fall. Preoperative chest x-ray for left hip fracture. EXAM: PORTABLE CHEST 1 VIEW COMPARISON:  Two-view chest x-ray 06/20/2004. FINDINGS: The heart is enlarged. Atherosclerotic calcifications are present at the aortic arch. There is no edema or effusion. Ill-defined right upper lobe airspace disease is concerning for infection. Bilateral apical scarring is noted. IMPRESSION: 1. Ill-defined right upper lobe airspace disease is concerning for pneumonia. 2. Otherwise stable chronic interstitial coarsening and biapical scarring. 3. Cardiomegaly without failure. 4. Aortic atherosclerosis. Electronically Signed   By:  Marin Roberts M.D.   On: 04/13/2016 19:46   Dg Femur Min 2 Views Left  Result Date: 04/13/2016 CLINICAL DATA:  Pain after fall EXAM: LEFT FEMUR 2 VIEWS COMPARISON:  None. FINDINGS: There is an oblique displaced fracture through the left femoral neck. No evidence of dislocation. Visualized pelvic bones are otherwise normal. Remainder of the femur is intact. IMPRESSION: Oblique displaced fracture through the left femoral neck. Electronically Signed   By: Gerome Sam III M.D   On: 04/13/2016 18:50    EKG: Independently reviewed.  Assessment/Plan Principal Problem:   Closed displaced fracture of left femoral neck (HCC)    1. Closed displaced fx of left femoral neck - 1. Patient on way to OR right now for hemiarthroplasty 2. Hip fx pathway 3. Need to address DVT ppx after surgery when surgeon feels it is safe to start anticoagulant post-op 4. Need to consult pt/ot after surgery when weight bearing orders are in 2. RA - on MTX, takes this on Mondays   DVT prophylaxis: SCDs only ordered for now, will need lovenox (DVT PPx dose) to be ordered post-op how ever long after Dr. Ranell Patrick feels it is reasonable to start (notified OR RN) Code Status: Full Code Family Communication: Family at bedside Consults called: Dr. Ranell Patrick has seen patient and patient is already headed to OR as I type this note Admission status: Admit to inpatient   Hillary Bow DO [facility] Pager [phone] from 7PM-7AM  If 7AM-7PM, please contact the day physician for the patient [email or web address] Password [facility]  04/13/2016, 7:57 PM

## 2016-04-13 NOTE — Discharge Instructions (Signed)
Weight Bearing as Tolerated L LE  Keep the incision clean and dry for one week, then ok to shower and get the wound wet  Posterior hip precautions  Follow up with Dr Ranell Patrick in two weeks [phone]  High fall risk

## 2016-04-13 NOTE — Anesthesia Procedure Notes (Signed)
Procedure Name: Intubation Date/Time: 04/13/2016 8:51 PM Performed by: Brien Mates D Pre-anesthesia Checklist: Patient identified, Emergency Drugs available, Suction available, Patient being monitored and Timeout performed Patient Re-evaluated:Patient Re-evaluated prior to inductionOxygen Delivery Method: Circle system utilized Preoxygenation: Pre-oxygenation with 100% oxygen Intubation Type: IV induction and Cricoid Pressure applied Ventilation: Mask ventilation without difficulty Laryngoscope Size: Miller and 2 Grade View: Grade II Tube type: Oral Tube size: 7.5 mm Number of attempts: 1 Airway Equipment and Method: Stylet Placement Confirmation: ETT inserted through vocal cords under direct vision,  positive ETCO2 and breath sounds checked- equal and bilateral Secured at: 21 cm Tube secured with: Tape Dental Injury: Teeth and Oropharynx as per pre-operative assessment

## 2016-04-13 NOTE — ED Notes (Signed)
Patient transported to X-ray 

## 2016-04-13 NOTE — ED Notes (Signed)
Patient transported to CT 

## 2016-04-13 NOTE — ED Notes (Signed)
Pt returned from xray and connected to the monitor 

## 2016-04-13 NOTE — Anesthesia Postprocedure Evaluation (Signed)
Anesthesia Post Note  Patient: [name] [name]  Procedure(s) Performed: Procedure(s) (LRB): ARTHROPLASTY BIPOLAR HIP (HEMIARTHROPLASTY) (Left)  Patient location during evaluation: PACU Anesthesia Type: General Level of consciousness: awake and alert and patient cooperative Pain management: pain level controlled Vital Signs Assessment: post-procedure vital signs reviewed and stable Respiratory status: spontaneous breathing and respiratory function stable Cardiovascular status: stable Anesthetic complications: no    Last Vitals:  Vitals:   04/13/16 2325 04/13/16 2330  BP: (!) 143/79 95/71  Pulse: 83 81  Resp: 19 11  Temp:      Last Pain:  Vitals:   04/13/16 2325  TempSrc:   PainSc: 9                  Jeanene Mena S

## 2016-04-13 NOTE — Consult Note (Signed)
Reason for Consult:Broken Left Hip Referring Physician: EDP   [name] [name] is an 82 y.o. female.  HPI: 82 yo female who lives independently in a condo who fell backwards today and injured her left hip.  Patient unable to strand due to left hip pain.  She did hit her head. No other complaints.  She has had several falls lately and does have a vertigo history.  Past Medical History:  Diagnosis Date  . Arthritis   . Cataract   . Glaucoma     Past Surgical History:  Procedure Laterality Date  . EYE SURGERY    . right hand surgery      History reviewed. No pertinent family history.  Social History:  reports that she has been smoking Cigarettes.  She has been smoking about 0.25 packs per day. She has never used smokeless tobacco. She reports that she does not drink alcohol or use drugs.  Allergies: No Known Allergies  Medications: I have reviewed the patient's current medications.  No results found for this or any previous visit (from the past 48 hour(s)).  Dg Pelvis 1-2 Views  Result Date: 04/13/2016 CLINICAL DATA:  Pain after fall EXAM: PELVIS - 1-2 VIEW COMPARISON:  None FINDINGS: There is an oblique fracture through the left femoral neck with displacement. IMPRESSION: Oblique fracture through the left femoral neck with displacement. No dislocation. Electronically Signed   By: Gerome Sam III M.D   On: 04/13/2016 18:49   Dg Femur Min 2 Views Left  Result Date: 04/13/2016 CLINICAL DATA:  Pain after fall EXAM: LEFT FEMUR 2 VIEWS COMPARISON:  None. FINDINGS: There is an oblique displaced fracture through the left femoral neck. No evidence of dislocation. Visualized pelvic bones are otherwise normal. Remainder of the femur is intact. IMPRESSION: Oblique displaced fracture through the left femoral neck. Electronically Signed   By: Gerome Sam III M.D   On: 04/13/2016 18:50    ROS Blood pressure 157/92, pulse 78, temperature 98.2 F (36.8 C), temperature source Oral, resp.  rate 19, height 5\' 3"  (1.6 m), weight 46.3 kg (102 lb), SpO2 95 %. Physical Exam  AAO x3 neck non tender, normal AROM, bilateral shoulders not swollen and normal AROM, good push pull with both hands 5/5 motor strength, abdomen soft and chest  Non tender to compression, L LE short and externally rotated, R LE, normal AROM, no deformity and NVI bilateral LE  Assessment/Plan: Displaced femoral neck fracture.  Discussed with patient and family recommending left hip hemiarthroplasty.   Head CT pending due to head trauma in fall.  If cleared then plan hip hemi this evening. Patient and family agree with plan.  Zikeria Keough,STEVEN R 04/13/2016, 6:59 PM

## 2016-04-13 NOTE — Brief Op Note (Signed)
04/13/2016  10:53 PM  PATIENT:  Sherilyn Dacosta  82 y.o. female  PRE-OPERATIVE DIAGNOSIS:  left femoral neck fracture, displaced  POST-OPERATIVE DIAGNOSIS:  left femoral neck fracture, displaced  PROCEDURE:  Procedure(s): ARTHROPLASTY BIPOLAR HIP (HEMIARTHROPLASTY) (Left) [place] unipolar  SURGEON:  Surgeon(s) and Role:    * Beverely Low, MD - Primary  PHYSICIAN ASSISTANT:   ASSISTANTS: Thea Gist, PA-C   ANESTHESIA:   general  EBL:  Total I/O In: 1000 [I.V.:1000] Out: 775 [Urine:575; Blood:200]  BLOOD ADMINISTERED:none  DRAINS: none   LOCAL MEDICATIONS USED:  NONE  SPECIMEN:  No Specimen  DISPOSITION OF SPECIMEN:  N/A  COUNTS:  YES  TOURNIQUET:  * No tourniquets in log *  DICTATION: .Other Dictation: Dictation Number [id number]  PLAN OF CARE: Admit to inpatient   PATIENT DISPOSITION:  PACU - hemodynamically stable.   Delay start of Pharmacological VTE agent (>24hrs) due to surgical blood loss or risk of bleeding: no

## 2016-04-13 NOTE — Anesthesia Preprocedure Evaluation (Signed)
Anesthesia Evaluation  Patient identified by MRN, date of birth, ID band Patient awake    Reviewed: Allergy & Precautions, H&P , NPO status , Patient's Chart, lab work & pertinent test results  Airway Mallampati: II   Neck ROM: full    Dental   Pulmonary Current Smoker,    breath sounds clear to auscultation       Cardiovascular negative cardio ROS   Rhythm:regular Rate:Normal     Neuro/Psych    GI/Hepatic   Endo/Other    Renal/GU      Musculoskeletal  (+) Arthritis ,   Abdominal   Peds  Hematology   Anesthesia Other Findings   Reproductive/Obstetrics                             Anesthesia Physical Anesthesia Plan  ASA: II  Anesthesia Plan: General   Post-op Pain Management:    Induction: Intravenous  Airway Management Planned: Oral ETT  Additional Equipment:   Intra-op Plan:   Post-operative Plan: Extubation in OR  Informed Consent: I have reviewed the patients History and Physical, chart, labs and discussed the procedure including the risks, benefits and alternatives for the proposed anesthesia with the patient or authorized representative who has indicated his/her understanding and acceptance.     Plan Discussed with: CRNA, Anesthesiologist and Surgeon  Anesthesia Plan Comments:         Anesthesia Quick Evaluation

## 2016-04-13 NOTE — Transfer of Care (Signed)
Immediate Anesthesia Transfer of Care Note  Patient: [name] [name]  Procedure(s) Performed: Procedure(s): ARTHROPLASTY BIPOLAR HIP (HEMIARTHROPLASTY) (Left)  Patient Location: PACU  Anesthesia Type:General  Level of Consciousness: awake  Airway & Oxygen Therapy: Patient Spontanous Breathing and Patient connected to face mask oxygen  Post-op Assessment: Report given to RN and Post -op Vital signs reviewed and stable  Post vital signs: Reviewed and stable  Last Vitals:  Vitals:   04/13/16 1845 04/13/16 1900  BP: 157/92 141/87  Pulse: 78 78  Resp: 19 17  Temp:      Last Pain:  Vitals:   04/13/16 1848  TempSrc:   PainSc: 7          Complications: No apparent anesthesia complications

## 2016-04-13 NOTE — ED Provider Notes (Signed)
[facility]-EMERGENCY DEPT Provider Note   CSN: [id number] Arrival date & time: 04/13/16  1704     History   Chief Complaint Chief Complaint  Patient presents with  . Fall  . Hip Pain    HPI [name] [name] is a 82 y.o. female.   Fall  This is a new problem. The current episode started less than 1 hour ago. Pertinent negatives include no chest pain, no abdominal pain, no headaches and no shortness of breath.  -Mechanical fall, tripped on doorway. No prodromal symptoms. -Hit back of head and L hip -Severe L hip pain, non ambulatory after fall  Past Medical History:  Diagnosis Date  . Arthritis   . Cataract   . Glaucoma     There are no active problems to display for this patient.   Past Surgical History:  Procedure Laterality Date  . EYE SURGERY    . right hand surgery      OB History    No data available       Home Medications    Prior to Admission medications   Medication Sig Start Date End Date Taking? Authorizing Provider  aspirin 81 MG tablet Take 81 mg by mouth daily.    Historical Provider, MD  atorvastatin (LIPITOR) 20 MG tablet Take 20 mg by mouth daily.  05/13/13   Historical Provider, MD  cholecalciferol (VITAMIN D) 1000 UNITS tablet Take 1,000 Units by mouth daily.    Historical Provider, MD  denosumab (PROLIA) 60 MG/ML SOLN injection Inject 60 mg into the skin every 6 (six) months. Administer in upper arm, thigh, or abdomen    Historical Provider, MD  folic acid (FOLVITE) 1 MG tablet Take 1 mg by mouth daily.  05/13/13   Historical Provider, MD  methotrexate (RHEUMATREX) 2.5 MG tablet Take 2.5 mg by mouth once a week. Caution:Chemotherapy. Protect from light.    Historical Provider, MD  Multiple Vitamins-Minerals (ICAPS MV PO) Take 1 tablet by mouth daily.    Historical Provider, MD  omega-3 acid ethyl esters (LOVAZA) 1 G capsule Take 1 g by mouth 2 (two) times daily.     Historical Provider, MD  traMADol (ULTRAM) 50 MG tablet Take 50 mg by mouth every 6  (six) hours as needed for moderate pain.     Historical Provider, MD    Family History History reviewed. No pertinent family history.  Social History Social History  Substance Use Topics  . Smoking status: Current Every Day Smoker    Packs/day: 0.25    Types: Cigarettes  . Smokeless tobacco: Never Used  . Alcohol use No     Allergies   Patient has no known allergies.   Review of Systems Review of Systems  Constitutional: Negative for fever.  HENT: Negative.   Respiratory: Negative for shortness of breath.   Cardiovascular: Negative for chest pain and palpitations.  Gastrointestinal: Negative for abdominal pain, nausea and vomiting.  Genitourinary: Negative for dysuria.  Musculoskeletal: Negative for back pain and neck pain.  Skin: Negative for wound.  Neurological: Negative for dizziness, weakness, numbness and headaches.  Psychiatric/Behavioral: Negative for confusion.  All other systems reviewed and are negative.    Physical Exam Updated Vital Signs BP 106/73 (BP Location: Right Arm)   Pulse 80   Temp 98.2 F (36.8 C) (Oral)   Resp 18   Ht 5\' 3"  (1.6 m)   Wt 46.3 kg   SpO2 99%   BMI 18.07 kg/m   Physical Exam  Constitutional: She  appears well-developed and well-nourished. No distress.  HENT:  Head: Normocephalic and atraumatic.  Mouth/Throat: Oropharynx is clear and moist.  Eyes: Conjunctivae and EOM are normal. Pupils are equal, round, and reactive to light.  Neck: Neck supple.  Cardiovascular: Normal rate, regular rhythm and intact distal pulses.   No murmur heard. Pulmonary/Chest: Effort normal and breath sounds normal. No respiratory distress. She has no wheezes. She has no rales.  Abdominal: Soft. There is no tenderness.  Musculoskeletal: She exhibits no edema.       Left hip: She exhibits decreased range of motion, tenderness, bony tenderness and deformity.  No spinal tenderness. L leg shortened.   Neurological: She is alert. She has normal  strength. No cranial nerve deficit or sensory deficit. GCS eye subscore is 4. GCS verbal subscore is 5. GCS motor subscore is 6.  Skin: Skin is warm and dry.  Psychiatric: She has a normal mood and affect.  Nursing note and vitals reviewed.    ED Treatments / Results  Labs (all labs ordered are listed, but only abnormal results are displayed) Labs Reviewed  BASIC METABOLIC PANEL - Abnormal; Notable for the following:       Result Value   GFR calc non Af Amer 51 (*)    GFR calc Af Amer 60 (*)    All other components within normal limits  URINALYSIS, ROUTINE W REFLEX MICROSCOPIC - Abnormal; Notable for the following:    Color, Urine STRAW (*)    Glucose, UA 50 (*)    Hgb urine dipstick SMALL (*)    Ketones, ur 20 (*)    Bacteria, UA RARE (*)    All other components within normal limits  CBC WITH DIFFERENTIAL/PLATELET  PROTIME-INR  CBC  BASIC METABOLIC PANEL  CBC  CREATININE, SERUM    EKG  EKG Interpretation  Date/Time:  Saturday April 13 2016 18:40:49 EST Ventricular Rate:  81 PR Interval:    QRS Duration: 87 QT Interval:  408 QTC Calculation: 474 R Axis:   47 Text Interpretation:  Sinus rhythm Probable left atrial enlargement RSR' in V1 or V2, right VCD or RVH ST-t wave abnormality Abnormal ekg Confirmed by Gerhard Munch  MD [phone]) on 04/13/2016 6:46:52 PM       Radiology Dg Pelvis 1-2 Views  Result Date: 04/13/2016 CLINICAL DATA:  Pain after fall EXAM: PELVIS - 1-2 VIEW COMPARISON:  None FINDINGS: There is an oblique fracture through the left femoral neck with displacement. IMPRESSION: Oblique fracture through the left femoral neck with displacement. No dislocation. Electronically Signed   By: Gerome Sam III M.D   On: 04/13/2016 18:49   Ct Head Wo Contrast  Result Date: 04/13/2016 CLINICAL DATA:  Initial evaluation for acute trauma, fall. Patient with left hip fracture. EXAM: CT HEAD WITHOUT CONTRAST TECHNIQUE: Contiguous axial images were obtained  from the base of the skull through the vertex without intravenous contrast. COMPARISON:  Previous MRI from 11/25/2004. FINDINGS: Brain: Generalized age-related cerebral atrophy. Patchy and confluent hypodensity within the periventricular and deep white matter both cerebral hemispheres most consistent with chronic microvascular ischemic changes. Remote lacunar infarct within the left basal ganglia. No acute intracranial hemorrhage. No evidence for acute large vessel territory infarct. No mass lesion, midline shift or mass effect. Ventricular prominence related to global parenchymal volume loss without hydrocephalus. No extra-axial fluid collection. Vascular: No hyperdense vessel. Prominent intracranial atherosclerosis noted. Skull: Scalp soft tissues demonstrate no acute abnormality. Calvarium intact. Sinuses/Orbits: Globes and orbital soft tissues demonstrate no acute  abnormality. Patient is status post cataract extraction bilaterally. Paranasal sinuses are clear. No mastoid effusion. IMPRESSION: 1. No acute intracranial process identified. 2. Generalized age-related cerebral atrophy with moderate chronic microvascular ischemic disease. Electronically Signed   By: Rise Mu M.D.   On: 04/13/2016 19:54   Dg Chest Port 1 View  Result Date: 04/13/2016 CLINICAL DATA:  Fall. Preoperative chest x-ray for left hip fracture. EXAM: PORTABLE CHEST 1 VIEW COMPARISON:  Two-view chest x-ray 06/20/2004. FINDINGS: The heart is enlarged. Atherosclerotic calcifications are present at the aortic arch. There is no edema or effusion. Ill-defined right upper lobe airspace disease is concerning for infection. Bilateral apical scarring is noted. IMPRESSION: 1. Ill-defined right upper lobe airspace disease is concerning for pneumonia. 2. Otherwise stable chronic interstitial coarsening and biapical scarring. 3. Cardiomegaly without failure. 4. Aortic atherosclerosis. Electronically Signed   By: Marin Roberts M.D.    On: 04/13/2016 19:46   Dg Femur Min 2 Views Left  Result Date: 04/13/2016 CLINICAL DATA:  Pain after fall EXAM: LEFT FEMUR 2 VIEWS COMPARISON:  None. FINDINGS: There is an oblique displaced fracture through the left femoral neck. No evidence of dislocation. Visualized pelvic bones are otherwise normal. Remainder of the femur is intact. IMPRESSION: Oblique displaced fracture through the left femoral neck. Electronically Signed   By: Gerome Sam III M.D   On: 04/13/2016 18:50    Procedures Procedures (including critical care time)  Medications Ordered in ED Medications - No data to display   Initial Impression / Assessment and Plan / ED Course  I have reviewed the triage vital signs and the nursing notes.  Pertinent labs & imaging results that were available during my care of the patient were reviewed by me and considered in my medical decision making (see chart for details).  Clinical Course    Patient is a 82 year old female with history arthritis who presents after a mechanical fall and severe left hip pain. Patient states she hit the back of her head as well without any loss of consciousness. Patient denies any neck or back pain.  On exam patient has a shortened left lower extremity with tenderness over the left greater trochanter and groin. Presentation concerning for left hip fracture. Patient otherwise atraumatic on exam. CT head obtained which is no skull fractures or head bleed. Left lower extremity x-rays show a left femoral neck fracture that is displaced.  Orthopedics was counseled and a plan for operative fixation tonight. Hospitalist is consulted for admission. Preoperative workup obtained.  Patient is admitted in stable condition.  Patient seen with Dr. Jeraldine Loots.   Final Clinical Impressions(s) / ED Diagnoses   Final diagnoses:  Fall  Closed displaced fracture of left femoral neck (HCC)  Hip fracture requiring operative repair, left, closed, initial encounter  [facility])    New Prescriptions New Prescriptions   No medications on file     Dwana Melena, DO 04/14/16 0135    Gerhard Munch, MD 04/18/16 [phone]

## 2016-04-13 NOTE — ED Triage Notes (Signed)
Per patient's family pt fell backwards onto her left hip, patient has been experiencing severe left hip pain s/p fall approx pta. CMS intact distally to injury. Pt admits to hitting the back of her head denies LOC, pt A&Ox4 on arrival. Pt reports she tripped walking into a door which caused her to fall.

## 2016-04-14 DIAGNOSIS — M069 Rheumatoid arthritis, unspecified: Secondary | ICD-10-CM

## 2016-04-14 LAB — URINALYSIS, ROUTINE W REFLEX MICROSCOPIC
BILIRUBIN URINE: NEGATIVE
GLUCOSE, UA: 50 mg/dL — AB
KETONES UR: 20 mg/dL — AB
Leukocytes, UA: NEGATIVE
NITRITE: NEGATIVE
PH: 7 (ref 5.0–8.0)
Protein, ur: NEGATIVE mg/dL
SQUAMOUS EPITHELIAL / LPF: NONE SEEN
Specific Gravity, Urine: 1.009 (ref 1.005–1.030)

## 2016-04-14 LAB — BASIC METABOLIC PANEL
Anion gap: 10 (ref 5–15)
BUN: 8 mg/dL (ref 6–20)
CALCIUM: 8.2 mg/dL — AB (ref 8.9–10.3)
CO2: 27 mmol/L (ref 22–32)
CREATININE: 0.74 mg/dL (ref 0.44–1.00)
Chloride: 101 mmol/L (ref 101–111)
GFR calc non Af Amer: >60 mL/min (ref >60)
Glucose, Bld: 134 mg/dL — ABNORMAL HIGH (ref 65–99)
Potassium: 3.2 mmol/L — ABNORMAL LOW (ref 3.5–5.1)
Sodium: 138 mmol/L (ref 135–145)

## 2016-04-14 LAB — CBC
HEMATOCRIT: 39.9 % (ref 36.0–46.0)
Hemoglobin: 13 g/dL (ref 12.0–15.0)
MCH: 31.3 pg (ref 26.0–34.0)
MCHC: 32.6 g/dL (ref 30.0–36.0)
MCV: 96.1 fL (ref 78.0–100.0)
Platelets: 212 10*3/uL (ref 150–400)
RBC: 4.15 MIL/uL (ref 3.87–5.11)
RDW: 14.6 % (ref 11.5–15.5)
WBC: 12 10*3/uL — ABNORMAL HIGH (ref 4.0–10.5)

## 2016-04-14 MED ORDER — ACETAMINOPHEN 325 MG PO TABS
650.0000 mg | ORAL_TABLET | Freq: Four times a day (QID) | ORAL | Status: DC | PRN
  Administered 2016-04-14 (×2): 650 mg via ORAL
  Filled 2016-04-14 (×2): qty 2

## 2016-04-14 MED ORDER — HYDROCODONE-ACETAMINOPHEN 5-325 MG PO TABS: 0.5000 | ORAL_TABLET | Freq: Four times a day (QID) | ORAL | Status: DC | PRN

## 2016-04-14 MED ORDER — ONDANSETRON HCL 4 MG PO TABS: 4.0000 mg | ORAL_TABLET | Freq: Four times a day (QID) | ORAL | Status: DC | PRN

## 2016-04-14 MED ORDER — DENOSUMAB 60 MG/ML ~~LOC~~ SOLN: 60.0000 mg | SUBCUTANEOUS | Status: DC

## 2016-04-14 MED ORDER — METOCLOPRAMIDE HCL 5 MG/ML IJ SOLN: 5.0000 mg | Freq: Three times a day (TID) | INTRAMUSCULAR | Status: DC | PRN

## 2016-04-14 MED ORDER — METOCLOPRAMIDE HCL 5 MG PO TABS: 5.0000 mg | ORAL_TABLET | Freq: Three times a day (TID) | ORAL | Status: DC | PRN

## 2016-04-14 MED ORDER — MENTHOL 3 MG MT LOZG: 1.0000 | LOZENGE | OROMUCOSAL | Status: DC | PRN

## 2016-04-14 MED ORDER — ONDANSETRON HCL 4 MG/2ML IJ SOLN: 4.0000 mg | Freq: Four times a day (QID) | INTRAMUSCULAR | Status: DC | PRN

## 2016-04-14 MED ORDER — PHENOL 1.4 % MT LIQD: 1.0000 | OROMUCOSAL | Status: DC | PRN

## 2016-04-14 MED ORDER — POTASSIUM CHLORIDE CRYS ER 20 MEQ PO TBCR
20.0000 meq | EXTENDED_RELEASE_TABLET | Freq: Two times a day (BID) | ORAL | Status: DC
  Administered 2016-04-14 – 2016-04-16 (×4): 20 meq via ORAL
  Filled 2016-04-14 (×5): qty 1

## 2016-04-14 MED ORDER — DOCUSATE SODIUM 100 MG PO CAPS
100.0000 mg | ORAL_CAPSULE | Freq: Two times a day (BID) | ORAL | Status: DC
  Administered 2016-04-14 – 2016-04-16 (×5): 100 mg via ORAL
  Filled 2016-04-14 (×6): qty 1

## 2016-04-14 MED ORDER — ENOXAPARIN SODIUM 30 MG/0.3ML ~~LOC~~ SOLN
30.0000 mg | SUBCUTANEOUS | Status: DC
  Administered 2016-04-14 – 2016-04-16 (×3): 30 mg via SUBCUTANEOUS
  Filled 2016-04-14 (×3): qty 0.3

## 2016-04-14 MED ORDER — MORPHINE SULFATE (PF) 2 MG/ML IV SOLN: 0.5000 mg | INTRAVENOUS | Status: DC | PRN

## 2016-04-14 MED ORDER — ACETAMINOPHEN 650 MG RE SUPP: 650.0000 mg | Freq: Four times a day (QID) | RECTAL | Status: DC | PRN

## 2016-04-14 MED ORDER — POLYETHYLENE GLYCOL 3350 17 G PO PACK: 17.0000 g | PACK | Freq: Every day | ORAL | Status: DC | PRN

## 2016-04-14 MED ORDER — TRAMADOL HCL 50 MG PO TABS
50.0000 mg | ORAL_TABLET | Freq: Four times a day (QID) | ORAL | Status: DC | PRN
  Administered 2016-04-15 – 2016-04-16 (×2): 50 mg via ORAL
  Filled 2016-04-14 (×2): qty 1

## 2016-04-14 MED ORDER — ASPIRIN 81 MG PO CHEW
81.0000 mg | CHEWABLE_TABLET | Freq: Every day | ORAL | Status: DC
  Administered 2016-04-14 – 2016-04-16 (×3): 81 mg via ORAL
  Filled 2016-04-14 (×3): qty 1

## 2016-04-14 NOTE — NC FL2 (Signed)
[place] MEDICAID FL2 LEVEL OF CARE SCREENING TOOL     IDENTIFICATION  Patient Name: [name] [name] Birthdate: March 07, 1923 Sex: female Admission Date (Current Location): 04/13/2016  [facility] and [place] Number:  Producer, television/film/video and Address:  [facility] [place], [address] [place], [place] [address]      Provider Number: [id number]  Attending Physician Name and Address:  Filbert Schilder, MD  Relative Name and Phone Number:       Current Level of Care: Hospital Recommended Level of Care: Skilled Nursing Facility Prior Approval Number:    Date Approved/Denied:   PASRR Number: [id number]  Discharge Plan: SNF    Current Diagnoses: Patient Active Problem List   Diagnosis Date Noted  . Closed displaced fracture of left femoral neck (HCC) 04/13/2016  . Hip fracture requiring operative repair, left, closed, initial encounter (HCC) 04/13/2016    Orientation RESPIRATION BLADDER Height & Weight     Self, Time, Situation, Place  Normal Continent Weight: 102 lb (46.3 kg) Height:  5\' 3"  (160 cm)  BEHAVIORAL SYMPTOMS/MOOD NEUROLOGICAL BOWEL NUTRITION STATUS      Continent Diet (Regular with Thin Liquids)  AMBULATORY STATUS COMMUNICATION OF NEEDS Skin   Extensive Assist Verbally Surgical wounds (Left Leg)                       Personal Care Assistance Level of Assistance  Bathing, Feeding, Dressing Bathing Assistance: Limited assistance Feeding assistance: Independent Dressing Assistance: Limited assistance     Functional Limitations Info  Sight, Hearing, Speech Sight Info: Adequate Hearing Info: Adequate Speech Info: Adequate    SPECIAL CARE FACTORS FREQUENCY  PT (By licensed PT), OT (By licensed OT)     PT Frequency:  5 x week OT Frequency: 5x week            Contractures Contractures Info: Not present    Additional Factors Info  Code Status, Allergies Code Status Info: Full Code Allergies Info: No Known Allergies           Current Medications (04/14/2016):  This is the current hospital active medication list Current Facility-Administered Medications  Medication Dose Route Frequency Provider Last Rate Last Dose  . 0.9 %  sodium chloride infusion   Intravenous Continuous 04/16/2016, MD 50 mL/hr at 04/14/16 0018    . acetaminophen (TYLENOL) tablet 650 mg  650 mg Oral Q6H PRN 04/16/16, MD   650 mg at 04/14/16 1537   Or  . acetaminophen (TYLENOL) suppository 650 mg  650 mg Rectal Q6H PRN 04/16/16, MD      . aspirin chewable tablet 81 mg  81 mg Oral Daily Beverely Low, MD   81 mg at 04/14/16 0954  . atorvastatin (LIPITOR) tablet 20 mg  20 mg Oral q1800 03-20-1989, DO      . cholecalciferol (VITAMIN D) tablet 1,000 Units  1,000 Units Oral Daily Hillary Bow, DO   1,000 Units at 04/14/16 0954  . docusate sodium (COLACE) capsule 100 mg  100 mg Oral BID 04/16/16, MD   100 mg at 04/14/16 0954  . enoxaparin (LOVENOX) injection 30 mg  30 mg Subcutaneous Q24H 04/16/16, MD   30 mg at 04/14/16 1537  . folic acid (FOLVITE) tablet 1 mg  1 mg Oral Daily 04/16/16, DO   1 mg at 04/14/16 04/16/16  . HYDROcodone-acetaminophen (NORCO/VICODIN) 5-325 MG per tablet 0.5-1 tablet  0.5-1 tablet Oral Q6H  PRN Beverely Low, MD      . HYDROcodone-acetaminophen (NORCO/VICODIN) 5-325 MG per tablet 1-2 tablet  1-2 tablet Oral Q6H PRN Hillary Bow, DO      . menthol-cetylpyridinium (CEPACOL) lozenge 3 mg  1 lozenge Oral PRN Beverely Low, MD       Or  . phenol (CHLORASEPTIC) mouth spray 1 spray  1 spray Mouth/Throat PRN Beverely Low, MD      . Melene Muller ON 04/15/2016] methotrexate (RHEUMATREX) tablet 2.5 mg  2.5 mg Oral Q Mon Jared M Gardner, DO      . metoCLOPramide (REGLAN) tablet 5-10 mg  5-10 mg Oral Q8H PRN Beverely Low, MD       Or  . metoCLOPramide (REGLAN) injection 5-10 mg  5-10 mg Intravenous Q8H PRN Beverely Low, MD      . morphine 2 MG/ML injection 0.5 mg  0.5 mg Intravenous Q2H PRN Beverely Low, MD       . morphine 4 MG/ML injection 0.52 mg  0.52 mg Intravenous Q2H PRN Hillary Bow, DO      . omega-3 acid ethyl esters (LOVAZA) capsule 1 g  1 g Oral BID Hillary Bow, DO      . ondansetron [facility]) tablet 4 mg  4 mg Oral Q6H PRN Beverely Low, MD       Or  . ondansetron [facility]) injection 4 mg  4 mg Intravenous Q6H PRN Beverely Low, MD      . polyethylene glycol (MIRALAX / GLYCOLAX) packet 17 g  17 g Oral Daily PRN Beverely Low, MD      . potassium chloride SA (K-DUR,KLOR-CON) CR tablet 20 mEq  20 mEq Oral BID Filbert Schilder, MD   20 mEq at 04/14/16 0954  . traMADol (ULTRAM) tablet 50-100 mg  50-100 mg Oral Q6H PRN Beverely Low, MD         Discharge Medications: Please see discharge summary for a list of discharge medications.  Relevant Imaging Results:  Relevant Lab Results:   Additional Information SSN [id number]   Macario Golds, [place] [phone]

## 2016-04-14 NOTE — Progress Notes (Signed)
Pt having difficulty swallowing . Bedtime medication held as they were to big for the pt to swallow . Will consult speech for swallow/speech  eval

## 2016-04-14 NOTE — Progress Notes (Signed)
Patient ID: [name] [name], female   DOB: 03-Oct-1922, 82 y.o.   MRN: [id number] Subjective: 1 Day Post-Op Procedure(s) (LRB): ARTHROPLASTY BIPOLAR HIP (HEMIARTHROPLASTY) (Left)    Patient reports pain as mild.  Pleasant lady, resting a bit with breakfast in front of her. No major complaints or issues  Objective:   VITALS:   Vitals:   04/14/16 0013 04/14/16 0417  BP: 140/72 (!) 107/58  Pulse: 89 77  Resp:    Temp: 97 F (36.1 C) 98.5 F (36.9 C)    Neurovascular intact Incision: dressing C/D/I  LABS  Recent Labs  04/13/16 1727 04/14/16 0400  HGB 15.0 13.0  HCT 45.0 39.9  WBC 8.0 12.0*  PLT 249 212     Recent Labs  04/13/16 1727 04/14/16 0400  NA 140 138  K 3.9 3.2*  BUN 14 8  CREATININE 0.94 0.74  GLUCOSE 86 134*     Recent Labs  04/13/16 1727  INR 1.01     Assessment/Plan: 1 Day Post-Op Procedure(s) (LRB): ARTHROPLASTY BIPOLAR HIP (HEMIARTHROPLASTY) (Left)   Advance diet Up with therapy Discharge to SNF depending or progress over next couple/few days

## 2016-04-14 NOTE — Progress Notes (Signed)
PROGRESS NOTE    KALE DOLS  TOI:[id number] DOB: May 12, 1922 DOA: 04/13/2016 PCP: Alysia Penna, MD    Brief Narrative:  [name] [name] is a 82 y.o. female with medical history significant of RA.  Lives independently in condo at baseline.  Today she had mechanical fall and injured left hip.  Immediate L hip pain after fall, unable to bear weight.  Pain worse with movement, better at rest.  Symptoms persistent. Xrays in ED show L femoral neck fx.  Dr. Ranell Patrick has already seen patient and is planning on OR.  Patient underwent arthroplasty bipolar hip (hemiarthroplasty) (left) [place] [place] [facility].  Assessment & Plan:   Principal Problem:   Closed displaced fracture of left femoral neck (HCC) Active Problems:   Hip fracture requiring operative repair, left, closed, initial encounter (HCC)  Closed displaced fx of left femoral neck - Patient s/p ARTHROPLASTY BIPOLAR HIP (HEMIARTHROPLASTY) (Left) [place] unipolar PT/OT consult Pain management per ortho SW consult as patient will likely need SNF at time of discharge Will restart DVT prophylaxis this afternoon  RA  - on MTX (takes this on Mondays)   DVT prophylaxis: lovenox starting today Code Status: Full Code Family Communication: No family bedside Disposition Plan: likely to SNF when patient stable   Consultants:   Orthopedics  PT/OT  Procedures:  ARTHROPLASTY BIPOLAR HIP (HEMIARTHROPLASTY) (Left) [place] unipolar  Antimicrobials:   preop antibiotics    Subjective: Patient seen and evaluated.  She states she is sleepy.  Says that otherwise she feels well.  Denies pain.  Denies any other complaints at this time.  Objective: Vitals:   04/13/16 2345 04/13/16 2351 04/14/16 0013 04/14/16 0417  BP: 136/64 140/79 140/72 (!) 107/58  Pulse: 84 85 89 77  Resp: 20 13    Temp:  97.7 F (36.5 C) 97 F (36.1 C) 98.5 F (36.9 C)  TempSrc:   Oral Oral  SpO2: 98% 100% 100% 98%  Weight:      Height:         Intake/Output Summary (Last 24 hours) at 04/14/16 1328 Last data filed at 04/14/16 0800  Gross per 24 hour  Intake             1220 ml  Output             1800 ml  Net             -580 ml   Filed Weights   04/13/16 1711  Weight: 46.3 kg (102 lb)    Examination:  General exam: Appears calm and comfortable  Respiratory system: Clear to auscultation. Respiratory effort normal. Cardiovascular system: S1 & S2 heard, RRR. No JVD, murmurs, rubs, gallops or clicks. No pedal edema. Gastrointestinal system: Abdomen is nondistended, soft and nontender. No organomegaly or masses felt. Normal bowel sounds heard. Central nervous system: Alert and oriented. No focal neurological deficits. Extremities: able to move feet and ankles bilaterally (could not assess left leg proximal to that), arm strength intact bilaterally Skin: No rashes, lesions or ulcers Psychiatry: Judgement and insight appear normal. Mood & affect appropriate.     Data Reviewed: I have personally reviewed following labs and imaging studies  CBC:  Recent Labs Lab 04/13/16 1727 04/14/16 0400  WBC 8.0 12.0*  NEUTROABS 4.3  --   HGB 15.0 13.0  HCT 45.0 39.9  MCV 96.4 96.1  PLT 249 212   Basic Metabolic Panel:  Recent Labs Lab 04/13/16 1727 04/14/16 0400  NA 140 138  K  3.9 3.2*  CL 106 101  CO2 22 27  GLUCOSE 86 134*  BUN 14 8  CREATININE 0.94 0.74  CALCIUM 9.5 8.2*   GFR: Estimated Creatinine Clearance: 33.5 mL/min (by C-G formula based on SCr of 0.74 mg/dL). Liver Function Tests: No results for input(s): AST, ALT, ALKPHOS, BILITOT, PROT, ALBUMIN in the last 168 hours. No results for input(s): LIPASE, AMYLASE in the last 168 hours. No results for input(s): AMMONIA in the last 168 hours. Coagulation Profile:  Recent Labs Lab 04/13/16 1727  INR 1.01   Cardiac Enzymes: No results for input(s): CKTOTAL, CKMB, CKMBINDEX, TROPONINI in the last 168 hours. BNP (last 3 results) No results for  input(s): PROBNP in the last 8760 hours. HbA1C: No results for input(s): HGBA1C in the last 72 hours. CBG: No results for input(s): GLUCAP in the last 168 hours. Lipid Profile: No results for input(s): CHOL, HDL, LDLCALC, TRIG, CHOLHDL, LDLDIRECT in the last 72 hours. Thyroid Function Tests: No results for input(s): TSH, T4TOTAL, FREET4, T3FREE, THYROIDAB in the last 72 hours. Anemia Panel: No results for input(s): VITAMINB12, FOLATE, FERRITIN, TIBC, IRON, RETICCTPCT in the last 72 hours. Sepsis Labs: No results for input(s): PROCALCITON, LATICACIDVEN in the last 168 hours.  No results found for this or any previous visit (from the past 240 hour(s)).       Radiology Studies: Dg Pelvis 1-2 Views  Result Date: 04/13/2016 CLINICAL DATA:  Pain after fall EXAM: PELVIS - 1-2 VIEW COMPARISON:  None FINDINGS: There is an oblique fracture through the left femoral neck with displacement. IMPRESSION: Oblique fracture through the left femoral neck with displacement. No dislocation. Electronically Signed   By: Gerome Sam III M.D   On: 04/13/2016 18:49   Ct Head Wo Contrast  Result Date: 04/13/2016 CLINICAL DATA:  Initial evaluation for acute trauma, fall. Patient with left hip fracture. EXAM: CT HEAD WITHOUT CONTRAST TECHNIQUE: Contiguous axial images were obtained from the base of the skull through the vertex without intravenous contrast. COMPARISON:  Previous MRI from 11/25/2004. FINDINGS: Brain: Generalized age-related cerebral atrophy. Patchy and confluent hypodensity within the periventricular and deep white matter both cerebral hemispheres most consistent with chronic microvascular ischemic changes. Remote lacunar infarct within the left basal ganglia. No acute intracranial hemorrhage. No evidence for acute large vessel territory infarct. No mass lesion, midline shift or mass effect. Ventricular prominence related to global parenchymal volume loss without hydrocephalus. No extra-axial  fluid collection. Vascular: No hyperdense vessel. Prominent intracranial atherosclerosis noted. Skull: Scalp soft tissues demonstrate no acute abnormality. Calvarium intact. Sinuses/Orbits: Globes and orbital soft tissues demonstrate no acute abnormality. Patient is status post cataract extraction bilaterally. Paranasal sinuses are clear. No mastoid effusion. IMPRESSION: 1. No acute intracranial process identified. 2. Generalized age-related cerebral atrophy with moderate chronic microvascular ischemic disease. Electronically Signed   By: Rise Mu M.D.   On: 04/13/2016 19:54   Pelvis Portable  Result Date: 04/14/2016 CLINICAL DATA:  82 year old female status post left hip arthroplasty. EXAM: PORTABLE PELVIS 1-2 VIEWS COMPARISON:  Radiograph dated 04/13/2016 FINDINGS: There has been interval left hip hemiarthroplasty. The arthroplasty components appear intact and in anatomic alignment with the acetabulum. The bones are osteopenic. No new fracture identified. There is extensive degenerative changes of the spine. The soft tissues are grossly unremarkable. Skin staples noted over the left hip. IMPRESSION: Interval left hip hemiarthroplasty appears intact and in anatomic alignment with the acetabular cup. Electronically Signed   By: Elgie Collard M.D.   On: 04/14/2016 02:19  Dg Chest Port 1 View  Result Date: 04/13/2016 CLINICAL DATA:  Fall. Preoperative chest x-ray for left hip fracture. EXAM: PORTABLE CHEST 1 VIEW COMPARISON:  Two-view chest x-ray 06/20/2004. FINDINGS: The heart is enlarged. Atherosclerotic calcifications are present at the aortic arch. There is no edema or effusion. Ill-defined right upper lobe airspace disease is concerning for infection. Bilateral apical scarring is noted. IMPRESSION: 1. Ill-defined right upper lobe airspace disease is concerning for pneumonia. 2. Otherwise stable chronic interstitial coarsening and biapical scarring. 3. Cardiomegaly without failure. 4.  Aortic atherosclerosis. Electronically Signed   By: Marin Roberts M.D.   On: 04/13/2016 19:46   Dg Femur Min 2 Views Left  Result Date: 04/13/2016 CLINICAL DATA:  Pain after fall EXAM: LEFT FEMUR 2 VIEWS COMPARISON:  None. FINDINGS: There is an oblique displaced fracture through the left femoral neck. No evidence of dislocation. Visualized pelvic bones are otherwise normal. Remainder of the femur is intact. IMPRESSION: Oblique displaced fracture through the left femoral neck. Electronically Signed   By: Gerome Sam III M.D   On: 04/13/2016 18:50        Scheduled Meds: . aspirin  81 mg Oral Daily  . atorvastatin  20 mg Oral q1800  . cholecalciferol  1,000 Units Oral Daily  . docusate sodium  100 mg Oral BID  . enoxaparin (LOVENOX) injection  30 mg Subcutaneous Q24H  . folic acid  1 mg Oral Daily  . [START ON 04/15/2016] methotrexate  2.5 mg Oral Q Mon  . omega-3 acid ethyl esters  1 g Oral BID  . potassium chloride  20 mEq Oral BID   Continuous Infusions: . sodium chloride 50 mL/hr at 04/14/16 0018     LOS: 1 day    Time spent: 30 minutes    Katrinka Blazing, MD [facility] Pager [phone]  If 7PM-7AM, please contact night-coverage [email or web address] Password [email or web address] 04/14/2016, 1:28 PM

## 2016-04-14 NOTE — Clinical Social Work Placement (Addendum)
   CLINICAL SOCIAL WORK PLACEMENT  NOTE  Date:  04/14/2016  Patient Details  Name: [name] [name] MRN: [id number] Date of Birth: August 16, 1922  Clinical Social Work is seeking post-discharge placement for this patient at the Skilled  Nursing Facility level of care (*CSW will initial, date and re-position this form in  chart as items are completed):  Yes   Patient/family provided with [facility] Clinical Social Work Department's list of facilities offering this level of care within the geographic area requested by the patient (or if unable, by the patient's family).  Yes   Patient/family informed of their freedom to choose among providers that offer the needed level of care, that participate in Medicare, Medicaid or managed care program needed by the patient, have an available bed and are willing to accept the patient.  Yes   Patient/family informed of [facility]'s ownership interest in [facility] and [facility], as well as of the fact that they are under no obligation to receive care at these facilities.  PASRR submitted to EDS on 04/14/16     PASRR number received on       Existing PASRR number confirmed on       FL2 transmitted to all facilities in geographic area requested by pt/family on 04/14/16     FL2 transmitted to all facilities within larger geographic area on       Patient informed that his/her managed care company has contracts with or will negotiate with certain facilities, including the following:            Patient/family informed of bed offers received.  Patient chooses bed at [facility].     Physician recommends and patient chooses bed at      Patient to be transferred to [facility] on 04/16/16.  Patient to be transferred to facility by [facility]     Patient family notified on 04/16/16 of transfer.  Name of family member notified: Joelene Millin      PHYSICIAN    Additional Comment:    Macario Golds, LCSW [address],  LCSWA [phone]

## 2016-04-14 NOTE — Clinical Social Work Note (Signed)
Clinical Social Work Assessment  Patient Details  Name: [name] [name] MRN: [id number] Date of Birth: 12/02/1922  Date of referral:  04/14/16               Reason for consult:  Facility Placement                Permission sought to share information with:  Family Supports Permission granted to share information::  Yes, Verbal Permission Granted  Name::     Yuleimy Kretz  Relationship::  Son  Contact Information:  [phone]  Housing/Transportation Living arrangements for the past 2 months:  Single Family Home Source of Information:  Patient Patient Interpreter Needed:  None Criminal Activity/Legal Involvement Pertinent to Current Situation/Hospitalization:  No - Comment as needed Significant Relationships:  Adult Children Lives with:  Self Do you feel safe going back to the place where you live?  Yes Need for family participation in patient care:  Yes (Comment)  Care giving concerns:  No family/friends at bedside.  Patient states that her son has been updated by MD and is aware of the recommendation for rehab.   Social Worker assessment / plan:  Holiday representative met with patient at bedside to offer support and discuss patient needs at discharge.  Patient states that she lives at home alone but is agreeable to ST-SNF placement.  Patient son and grandchildren live nearby and plan to provide adequate support to patient as well.  CSW to initiate SNF search and follow up with patient and family regarding available bed offers.  CSW remains available for support and to facilitate patient discharge needs once medically stable.  Employment status:  Retired Nurse, adult PT Recommendations:  [place] / Referral to community resources:  [place]  Patient/Family's Response to care:  Patient verbalized understanding of CSW role and appreciation for support and concern.  Patient is agreeable with ST-SNF placement prior  to return home.  Patient/Family's Understanding of and Emotional Response to Diagnosis, Current Treatment, and Prognosis:  Patient understanding of current hospitalization and is realistic regarding limitations for return home.  Emotional Assessment Appearance:  Appears younger than stated age Attitude/Demeanor/Rapport:   (Appropriate and Engaged) Affect (typically observed):  Calm, Pleasant, Hopeful Orientation:  Oriented to Self, Oriented to Situation, Oriented to Place, Oriented to  Time Alcohol / Substance use:  Not Applicable Psych involvement (Current and /or in the community):  No (Comment)  Discharge Needs  Concerns to be addressed:  Discharge Planning Concerns Readmission within the last 30 days:  No Current discharge risk:  Lives alone Barriers to Discharge:  Continued Medical Work up  [employer]

## 2016-04-14 NOTE — Evaluation (Signed)
Occupational Therapy Evaluation Patient Details Name: [name] [name] MRN: [id number] DOB: 07-Mar-1923 Today's Date: 04/14/2016    History of Present Illness 82 yo female admitted through ED on 04/13/16 following a fall backwards through a door frame landing on her left hip. Pt sustained a left femoral neck fx and underwent a hemiarthroplasty last last night. PMH is significant for OA, Cataracts, Galucoma, and vertigo.    Clinical Impression   PTA Pt lived alone in a condo and was performing all ADL (no driving) and ambulating with a SPC. Pt currently max assist for LB ADL, and mod assist for transfer +2 for ambulation. Pt very independent, and has strong supportive family. Pt hit back of head with fall and denies changes in vision (glaucoma history) and has had several episodes of vertigo resulting in falls at home. Please see performance level below. Pt will benefit from skilled OT in the acute care setting prior to d/c. This patient will require SNF level rehab to help her meet her goal of living on her own again.    Follow Up Recommendations  SNF;Supervision/Assistance - 24 hour    Equipment Recommendations  Other (comment) (to be determined by next venue of care)    Recommendations for Other Services       Precautions / Restrictions Precautions Precautions: Fall Precaution Comments: has had multiple falls in the past few weeks which she contributes to "vertigo" Restrictions Weight Bearing Restrictions: Yes LLE Weight Bearing: Weight bearing as tolerated      Mobility Bed Mobility Overal bed mobility: Needs Assistance Bed Mobility: Supine to Sit     Supine to sit: Mod assist     General bed mobility comments: Mod a to bring LLE EOB and assist with trunk with use of railing to get EOB  Transfers Overall transfer level: Needs assistance Equipment used: Rolling walker (2 wheeled) Transfers: Sit to/from [employer] Sit to Stand: Mod assist Stand pivot  transfers: Mod assist       General transfer comment: Mod A with cues for proper sequencing with transfer activities    Balance Overall balance assessment: Needs assistance Sitting-balance support: No upper extremity supported;Feet supported Sitting balance-Leahy Scale: Fair Sitting balance - Comments: sitting EOB with no back support and on BSC   Standing balance support: Bilateral upper extremity supported;During functional activity Standing balance-Leahy Scale: Poor Standing balance comment: reliant on RW, with heavy posterior body position in relation to RW                            ADL Overall ADL's : Needs assistance/impaired Eating/Feeding: Set up;Sitting   Grooming: Wash/dry face;Set up;Sitting   Upper Body Bathing: Min guard;Sitting   Lower Body Bathing: Moderate assistance;Sitting/lateral leans   Upper Body Dressing : Set up;Sitting   Lower Body Dressing: Maximal assistance;Sit to/from stand   Toilet Transfer: Moderate assistance;Stand-pivot;RW;BSC Toilet Transfer Details (indicate cue type and reason): Pt required mod assist for power up from bed, and then max verbal cues for foot movement required for stand pivot with RW Toileting- Clothing Manipulation and Hygiene: Supervision/safety;Sit to/from stand       Functional mobility during ADLs: +2 for physical assistance;+2 for safety/equipment;Rolling walker;Cueing for sequencing General ADL Comments: Pt very independent and wants to do things for herself     Vision     Perception     Praxis      Pertinent Vitals/Pain Pain Assessment: 0-10 Pain Score: 2  Pain  Location: left hip with movement Pain Descriptors / Indicators: Guarding;Grimacing;Sore Pain Intervention(s): Monitored during session;Repositioned;Ice applied     Hand Dominance Right   Extremity/Trunk Assessment Upper Extremity Assessment Upper Extremity Assessment: Generalized weakness   Lower Extremity Assessment Lower  Extremity Assessment: LLE deficits/detail LLE Deficits / Details: decreased ROM and strength post-op (after fall)   Cervical / Trunk Assessment Cervical / Trunk Assessment: Kyphotic   Communication Communication Communication: No difficulties   Cognition Arousal/Alertness: Awake/alert Behavior During Therapy: WFL for tasks assessed/performed Overall Cognitive Status: Within Functional Limits for tasks assessed                     General Comments       Exercises      Shoulder Instructions      Home Living Family/patient expects to be discharged to:: Private residence Living Arrangements: Alone Available Help at Discharge: Family;Available PRN/intermittently Type of Home: Other(Comment) (Condo) Home Access: Stairs to enter Entrance Stairs-Number of Steps: 4 Entrance Stairs-Rails: Right;Left;Can reach both Home Layout: One level     Bathroom Shower/Tub: Producer, television/film/video: Handicapped height Bathroom Accessibility: Yes How Accessible: Accessible via walker Home Equipment: Walker - 2 wheels;Wheelchair - manual;Cane - single point;Grab bars - tub/shower;Hand held shower head;Shower seat - built in   Additional Comments: Son "Chip" is very supportive      Prior Functioning/Environment Level of Independence: Independent with assistive device(s)        Comments: Used cane in home, son drives her MD appointments, get groceries, etc.        OT Problem List: Decreased strength;Decreased range of motion;Decreased activity tolerance;Impaired balance (sitting and/or standing);Decreased knowledge of use of DME or AE;Pain   OT Treatment/Interventions: Self-care/ADL training;DME and/or AE instruction;Therapeutic activities;Patient/family education;Balance training    OT Goals(Current goals can be found in the care plan section) Acute Rehab OT Goals Patient Stated Goal: to get home and walk again OT Goal Formulation: With patient Time For Goal  Achievement: 04/21/16 Potential to Achieve Goals: Good ADL Goals Pt Will Perform Lower Body Dressing: with modified independence;sit to/from stand;with adaptive equipment Pt Will Transfer to Toilet: with modified independence;ambulating (RW) Pt Will Perform Toileting - Clothing Manipulation and hygiene: with modified independence;sit to/from stand Pt Will Perform Tub/Shower Transfer: Shower transfer;with modified independence;ambulating;shower seat;grab bars;rolling walker  OT Frequency: Min 2X/week   Barriers to D/C: Decreased caregiver support  Pt lives alone       Co-evaluation PT/OT/SLP Co-Evaluation/Treatment: Yes Reason for Co-Treatment: Complexity of the patient's impairments (multi-system involvement);For patient/therapist safety;To address functional/ADL transfers PT goals addressed during session: Mobility/safety with mobility OT goals addressed during session: ADL's and self-care      End of Session Equipment Utilized During Treatment: Gait belt;Rolling walker Nurse Communication: Mobility status;Weight bearing status (amount of assist it took to get her to the recliner)  Activity Tolerance: Patient tolerated treatment well Patient left: in chair;with call bell/phone within reach;with family/visitor present   Time: 0315-9458 OT Time Calculation (min): 48 min Charges:  OT General Charges $OT Visit: 1 Procedure OT Evaluation $OT Eval Moderate Complexity: 1 Procedure G-Codes:    Evern Bio Deidrick Rainey 05/04/2016, 2:13 PM Sherryl Manges OTR/L [phone]

## 2016-04-14 NOTE — Evaluation (Signed)
Physical Therapy Evaluation Patient Details Name: [name] [name] MRN: [id number] DOB: Apr 29, 1923 Today's Date: 04/14/2016   History of Present Illness  82 yo female admitted through ED on 04/13/16 following a fall backwards through a door frame landing on her left hip. Pt sustained a left femoral neck fx and underwent a hemiarthroplasty last last night. PMH is significant for OA, Cataracts, Galucoma, and vertigo.   Clinical Impression  Pt is POD 1 following above diagnosis. Prior to admission, pt was living alone in her accessible apartment with son assisted PRN. Pt ambulated with  prior to discharge and was able to perform own dressing,light house keeping and cooking. Pt presents with increased difficulty with weightbearing through LLE during gait and requires increased assistance for transfer, balance, and gait activities. Discussed discharge options with pt and son and in agreement to SNF for short term rehab will be best option for pt at this time in order to maximize her functional outcomes. Pt will benefit from continuing to be seen acutely to assist with a return to PLOF.    Follow Up Recommendations SNF    Equipment Recommendations  None recommended by PT    Recommendations for Other Services       Precautions / Restrictions Precautions Precautions: Fall Precaution Comments: has had multiple falls in the past few weeks which she contributes to "vertigo" Restrictions Weight Bearing Restrictions: Yes LLE Weight Bearing: Weight bearing as tolerated      Mobility  Bed Mobility Overal bed mobility: Needs Assistance Bed Mobility: Supine to Sit     Supine to sit: Mod assist     General bed mobility comments: Mod a to bring LLE EOB and assist with trunk with use of railing to get EOB  Transfers Overall transfer level: Needs assistance Equipment used: Rolling walker (2 wheeled) Transfers: Sit to/from [employer] Sit to Stand: Mod assist Stand pivot  transfers: Mod assist       General transfer comment: Mod A with cues for proper sequencing with transfer activities  Ambulation/Gait Ambulation/Gait assistance: Mod assist;+2 physical assistance;+2 safety/equipment Ambulation Distance (Feet): 5 Feet Assistive device: Rolling walker (2 wheeled) Gait Pattern/deviations: Step-to pattern;Decreased step length - left;Decreased step length - right;Decreased weight shift to left;Leaning posteriorly Gait velocity: decreased Gait velocity interpretation: Below normal speed for age/gender General Gait Details: Pt requires cues for moving weight over BOS and for proper sequencing. Attempted to get pt to increase step length on LLE and to keep RW in front of her, but she continues to step in place instead of move forward.   Stairs            Wheelchair Mobility    Modified Rankin (Stroke Patients Only)       Balance                                             Pertinent Vitals/Pain Pain Assessment: 0-10 Pain Score: 2  Pain Location: left hip with movement Pain Descriptors / Indicators: Guarding;Grimacing;Sore Pain Intervention(s): Monitored during session;Repositioned;Ice applied    Home Living Family/patient expects to be discharged to:: Private residence Living Arrangements: Alone Available Help at Discharge: Family;Available PRN/intermittently Type of Home: Other(Comment) (Condo) Home Access: Stairs to enter Entrance Stairs-Rails: Right;Left;Can reach both Entrance Stairs-Number of Steps: 4 Home Layout: One level Home Equipment: Walker - 2 wheels;Wheelchair - manual;Cane - single point;Grab bars - tub/shower;Hand  held shower head;Shower seat - built in      Prior Function Level of Independence: Independent with assistive device(s)         Comments: Used cane in home, son helps PRN     Hand Dominance   Dominant Hand: Right    Extremity/Trunk Assessment   Upper Extremity Assessment Upper  Extremity Assessment: Defer to OT evaluation    Lower Extremity Assessment Lower Extremity Assessment: LLE deficits/detail LLE Deficits / Details: pt at least 3/5 ankle and 2/5 knee and hip per gross functional asessment       Communication   Communication: No difficulties  Cognition Arousal/Alertness: Awake/alert Behavior During Therapy: WFL for tasks assessed/performed Overall Cognitive Status: Within Functional Limits for tasks assessed                      General Comments      Exercises Total Joint Exercises Ankle Circles/Pumps: AROM;Both;20 reps;Seated Quad Sets: AROM;Left;10 reps;Supine   Assessment/Plan    PT Assessment Patient needs continued PT services  PT Problem List Decreased strength;Decreased range of motion;Decreased activity tolerance;Decreased balance;Decreased mobility;Decreased knowledge of use of DME;Pain          PT Treatment Interventions      PT Goals (Current goals can be found in the Care Plan section)  Acute Rehab PT Goals Patient Stated Goal: to get home and walk again PT Goal Formulation: With patient Time For Goal Achievement: 04/21/16 Potential to Achieve Goals: Good    Frequency Min 3X/week   Barriers to discharge Decreased caregiver support;Other (comment) Pt will require 24 hr cg supervision upon returning home and will need SNF placement before discharge    Co-evaluation PT/OT/SLP Co-Evaluation/Treatment: Yes Reason for Co-Treatment: For patient/therapist safety;Complexity of the patient's impairments (multi-system involvement) PT goals addressed during session: Mobility/safety with mobility         End of Session Equipment Utilized During Treatment: Gait belt Activity Tolerance: Patient tolerated treatment well;Patient limited by fatigue Patient left: in chair;with call bell/phone within reach;with family/visitor present Nurse Communication: Mobility status         Time: 5597-4163 PT Time Calculation (min)  (ACUTE ONLY): 48 min   Charges:   PT Evaluation $PT Eval Moderate Complexity: 1 Procedure PT Treatments $Gait Training: 8-22 mins   PT G Codes:        Colin Broach PT, DPT  [phone]  04/14/2016, 1:10 PM

## 2016-04-15 ENCOUNTER — Encounter (HOSPITAL_COMMUNITY): Payer: Self-pay

## 2016-04-15 DIAGNOSIS — S72002A Fracture of unspecified part of neck of left femur, initial encounter for closed fracture: Principal | ICD-10-CM

## 2016-04-15 LAB — BASIC METABOLIC PANEL
Anion gap: 10 (ref 5–15)
BUN: 10 mg/dL (ref 6–20)
CALCIUM: 8 mg/dL — AB (ref 8.9–10.3)
CO2: 24 mmol/L (ref 22–32)
CREATININE: 0.74 mg/dL (ref 0.44–1.00)
Chloride: 105 mmol/L (ref 101–111)
GFR calc Af Amer: >60 mL/min (ref >60)
GLUCOSE: 96 mg/dL (ref 65–99)
POTASSIUM: 3.3 mmol/L — AB (ref 3.5–5.1)
SODIUM: 139 mmol/L (ref 135–145)

## 2016-04-15 LAB — CBC
HCT: 39.9 % (ref 36.0–46.0)
Hemoglobin: 13.2 g/dL (ref 12.0–15.0)
MCH: 31.5 pg (ref 26.0–34.0)
MCHC: 33.1 g/dL (ref 30.0–36.0)
MCV: 95.2 fL (ref 78.0–100.0)
PLATELETS: 177 10*3/uL (ref 150–400)
RBC: 4.19 MIL/uL (ref 3.87–5.11)
RDW: 14.6 % (ref 11.5–15.5)
WBC: 9.5 10*3/uL (ref 4.0–10.5)

## 2016-04-15 MED ORDER — BOOST / RESOURCE BREEZE PO LIQD: 1.0000 | Freq: Two times a day (BID) | ORAL | Status: DC

## 2016-04-15 MED ORDER — METHOTREXATE 2.5 MG PO TABS
20.0000 mg | ORAL_TABLET | ORAL | Status: DC
  Administered 2016-04-15: 20 mg via ORAL
  Filled 2016-04-15 (×2): qty 8

## 2016-04-15 NOTE — Progress Notes (Signed)
Physical Therapy Treatment Patient Details Name: [name] [name] MRN: [id number] DOB: 09-May-1922 Today's Date: 04/15/2016    History of Present Illness 82 yo female admitted through ED on 04/13/16 following a fall backwards through a door frame landing on her left hip. Pt sustained a left femoral neck fx and underwent a hemiarthroplasty last last night. PMH is significant for OA, Cataracts, Galucoma, and vertigo.     PT Comments    Patient is making gradual progress toward mobility goals and is eager to participate in therapy. Continue to progress as tolerated with anticipated d/c to SNF for further skilled PT services.    Follow Up Recommendations  SNF     Equipment Recommendations  None recommended by PT    Recommendations for Other Services       Precautions / Restrictions Precautions Precautions: Fall Precaution Comments: has had multiple falls in the past few weeks which she contributes to "vertigo" Restrictions Weight Bearing Restrictions: Yes LLE Weight Bearing: Weight bearing as tolerated    Mobility  Bed Mobility Overal bed mobility: Needs Assistance Bed Mobility: Supine to Sit     Supine to sit: Mod assist;Min assist     General bed mobility comments: assist to bring L LE to EOB and elevate trunk into sitting; min A to scoot hips to EOB   Transfers Overall transfer level: Needs assistance Equipment used: Rolling walker (2 wheeled) Transfers: Sit to/from [employer] Sit to Stand: Min assist;+2 safety/equipment         General transfer comment: assist to power up into full standing and cues for hand placement  Ambulation/Gait Ambulation/Gait assistance: +2 safety/equipment;Min assist Ambulation Distance (Feet): 6 Feet Assistive device: Rolling walker (2 wheeled) Gait Pattern/deviations: Decreased step length - left;Decreased weight shift to left;Leaning posteriorly;Step-through pattern;Decreased stance time - left;Decreased step length -  right;Trunk flexed Gait velocity: decreased   General Gait Details: pt demonstrated step through gait pattern this session but with decreased R foot clearance; cues for proximity of RW, posture, and sequencing   Stairs            Wheelchair Mobility    Modified Rankin (Stroke Patients Only)       Balance                                    Cognition Arousal/Alertness: Awake/alert Behavior During Therapy: WFL for tasks assessed/performed Overall Cognitive Status: Within Functional Limits for tasks assessed                      Exercises      General Comments General comments (skin integrity, edema, etc.): pt given HEP handout and reviewed exercises      Pertinent Vitals/Pain Pain Assessment: Faces Faces Pain Scale: Hurts little more Pain Location: left hip with movement Pain Descriptors / Indicators: Guarding;Grimacing;Sore Pain Intervention(s): Limited activity within patient's tolerance;Monitored during session;Premedicated before session;Repositioned;Ice applied    Home Living                      Prior Function            PT Goals (current goals can now be found in the care plan section) Acute Rehab PT Goals Patient Stated Goal: to get home and walk again PT Goal Formulation: With patient Time For Goal Achievement: 04/21/16 Potential to Achieve Goals: Good Progress towards PT goals: Progressing toward goals  Frequency    Min 3X/week      PT Plan Current plan remains appropriate    Co-evaluation             End of Session Equipment Utilized During Treatment: Gait belt Activity Tolerance: Patient tolerated treatment well Patient left: in chair;with call bell/phone within reach;with family/visitor present     Time: 5784-6962 PT Time Calculation (min) (ACUTE ONLY): 19 min  Charges:  $Gait Training: 8-22 mins                    G Codes:      Derek Mound, PTA Pager: [phone]   04/15/2016, 4:15 PM

## 2016-04-15 NOTE — Progress Notes (Signed)
PROGRESS NOTE    [name] [name]  FKC:[id number] DOB: 1922-12-28 DOA: 04/13/2016 PCP: Alysia Penna, MD    Brief Narrative:  [name] [name] is a 82 y.o. female with medical history significant of RA.  Lives independently in condo at baseline.  Today she had mechanical fall and injured left hip.  Immediate L hip pain after fall, unable to bear weight.  Pain worse with movement, better at rest.  Symptoms persistent. Xrays in ED show L femoral neck fx.  Dr. Ranell Patrick has already seen patient and is planning on OR.  Patient underwent arthroplasty bipolar hip (hemiarthroplasty) (left) [place] [facility].  Assessment & Plan:   Principal Problem:   Closed displaced fracture of left femoral neck (HCC) Active Problems:   Hip fracture requiring operative repair, left, closed, initial encounter (HCC)  Closed displaced fx of left femoral neck - Patient s/p ARTHROPLASTY BIPOLAR HIP (HEMIARTHROPLASTY) (Left) [place] unipolar PT/OT consult Pain management per ortho SW consult -need SNF at time of discharge ?ASA or lovenox at time of d/c  RA  - on MTX (takes this on Mondays)   DVT prophylaxis: lovenox starting today Code Status: Full Code Family Communication: No family bedside Disposition Plan: likely to SNF when stable   Consultants:   Orthopedics  PT/OT  Procedures:  ARTHROPLASTY BIPOLAR HIP (HEMIARTHROPLASTY) (Left) [place] unipolar  Antimicrobials:   preop antibiotics    Subjective: Patient seen and evaluated.  She states she is sleepy.  Says that otherwise she feels well.  Denies pain.  Denies any other complaints at this time.  Objective: Vitals:   04/14/16 0417 04/14/16 1448 04/14/16 1955 04/15/16 0640  BP: (!) 107/58 (!) 96/47 (!) 107/49 136/61  Pulse: 77 81 60 87  Resp:      Temp: 98.5 F (36.9 C) 98.6 F (37 C) 99.5 F (37.5 C) 99.1 F (37.3 C)  TempSrc: Oral Oral Oral Oral  SpO2: 98% 93% 91% 97%  Weight:      Height:        Intake/Output  Summary (Last 24 hours) at 04/15/16 1133 Last data filed at 04/15/16 0730  Gross per 24 hour  Intake              120 ml  Output                0 ml  Net              120 ml   Filed Weights   04/13/16 1711  Weight: 46.3 kg (102 lb)    Examination:  General exam: Appears calm and comfortable  Respiratory system: Clear to auscultation. Respiratory effort normal. Cardiovascular system: S1 & S2 heard, RRR. No JVD, murmurs, rubs, gallops or clicks. No pedal edema. Gastrointestinal system: Abdomen is nondistended, soft and nontender. No organomegaly or masses felt. Normal bowel sounds heard. Central nervous system: Alert and oriented. No focal neurological deficits. Extremities: able to move feet and ankles bilaterally (could not assess left leg proximal to that), arm strength intact bilaterally Skin: No rashes, lesions or ulcers Psychiatry: Judgement and insight appear normal. Mood & affect appropriate.     Data Reviewed: I have personally reviewed following labs and imaging studies  CBC:  Recent Labs Lab 04/13/16 1727 04/14/16 0400 04/15/16 0400  WBC 8.0 12.0* 9.5  NEUTROABS 4.3  --   --   HGB 15.0 13.0 13.2  HCT 45.0 39.9 39.9  MCV 96.4 96.1 95.2  PLT 249 212 177   Basic  Metabolic Panel:  Recent Labs Lab 04/13/16 1727 04/14/16 0400 04/15/16 0400  NA 140 138 139  K 3.9 3.2* 3.3*  CL 106 101 105  CO2 22 27 24   GLUCOSE 86 134* 96  BUN 14 8 10   CREATININE 0.94 0.74 0.74  CALCIUM 9.5 8.2* 8.0*   GFR: Estimated Creatinine Clearance: 33.5 mL/min (by C-G formula based on SCr of 0.74 mg/dL). Liver Function Tests: No results for input(s): AST, ALT, ALKPHOS, BILITOT, PROT, ALBUMIN in the last 168 hours. No results for input(s): LIPASE, AMYLASE in the last 168 hours. No results for input(s): AMMONIA in the last 168 hours. Coagulation Profile:  Recent Labs Lab 04/13/16 1727  INR 1.01   Cardiac Enzymes: No results for input(s): CKTOTAL, CKMB, CKMBINDEX, TROPONINI  in the last 168 hours. BNP (last 3 results) No results for input(s): PROBNP in the last 8760 hours. HbA1C: No results for input(s): HGBA1C in the last 72 hours. CBG: No results for input(s): GLUCAP in the last 168 hours. Lipid Profile: No results for input(s): CHOL, HDL, LDLCALC, TRIG, CHOLHDL, LDLDIRECT in the last 72 hours. Thyroid Function Tests: No results for input(s): TSH, T4TOTAL, FREET4, T3FREE, THYROIDAB in the last 72 hours. Anemia Panel: No results for input(s): VITAMINB12, FOLATE, FERRITIN, TIBC, IRON, RETICCTPCT in the last 72 hours. Sepsis Labs: No results for input(s): PROCALCITON, LATICACIDVEN in the last 168 hours.  No results found for this or any previous visit (from the past 240 hour(s)).       Radiology Studies: Dg Pelvis 1-2 Views  Result Date: 04/13/2016 CLINICAL DATA:  Pain after fall EXAM: PELVIS - 1-2 VIEW COMPARISON:  None FINDINGS: There is an oblique fracture through the left femoral neck with displacement. IMPRESSION: Oblique fracture through the left femoral neck with displacement. No dislocation. Electronically Signed   By: Gerome Sam III M.D   On: 04/13/2016 18:49   Ct Head Wo Contrast  Result Date: 04/13/2016 CLINICAL DATA:  Initial evaluation for acute trauma, fall. Patient with left hip fracture. EXAM: CT HEAD WITHOUT CONTRAST TECHNIQUE: Contiguous axial images were obtained from the base of the skull through the vertex without intravenous contrast. COMPARISON:  Previous MRI from 11/25/2004. FINDINGS: Brain: Generalized age-related cerebral atrophy. Patchy and confluent hypodensity within the periventricular and deep white matter both cerebral hemispheres most consistent with chronic microvascular ischemic changes. Remote lacunar infarct within the left basal ganglia. No acute intracranial hemorrhage. No evidence for acute large vessel territory infarct. No mass lesion, midline shift or mass effect. Ventricular prominence related to global  parenchymal volume loss without hydrocephalus. No extra-axial fluid collection. Vascular: No hyperdense vessel. Prominent intracranial atherosclerosis noted. Skull: Scalp soft tissues demonstrate no acute abnormality. Calvarium intact. Sinuses/Orbits: Globes and orbital soft tissues demonstrate no acute abnormality. Patient is status post cataract extraction bilaterally. Paranasal sinuses are clear. No mastoid effusion. IMPRESSION: 1. No acute intracranial process identified. 2. Generalized age-related cerebral atrophy with moderate chronic microvascular ischemic disease. Electronically Signed   By: Rise Mu M.D.   On: 04/13/2016 19:54   Pelvis Portable  Result Date: 04/14/2016 CLINICAL DATA:  82 year old female status post left hip arthroplasty. EXAM: PORTABLE PELVIS 1-2 VIEWS COMPARISON:  Radiograph dated 04/13/2016 FINDINGS: There has been interval left hip hemiarthroplasty. The arthroplasty components appear intact and in anatomic alignment with the acetabulum. The bones are osteopenic. No new fracture identified. There is extensive degenerative changes of the spine. The soft tissues are grossly unremarkable. Skin staples noted over the left hip. IMPRESSION: Interval left hip  hemiarthroplasty appears intact and in anatomic alignment with the acetabular cup. Electronically Signed   By: Elgie Collard M.D.   On: 04/14/2016 02:19   Dg Chest Port 1 View  Result Date: 04/13/2016 CLINICAL DATA:  Fall. Preoperative chest x-ray for left hip fracture. EXAM: PORTABLE CHEST 1 VIEW COMPARISON:  Two-view chest x-ray 06/20/2004. FINDINGS: The heart is enlarged. Atherosclerotic calcifications are present at the aortic arch. There is no edema or effusion. Ill-defined right upper lobe airspace disease is concerning for infection. Bilateral apical scarring is noted. IMPRESSION: 1. Ill-defined right upper lobe airspace disease is concerning for pneumonia. 2. Otherwise stable chronic interstitial coarsening  and biapical scarring. 3. Cardiomegaly without failure. 4. Aortic atherosclerosis. Electronically Signed   By: Marin Roberts M.D.   On: 04/13/2016 19:46   Dg Femur Min 2 Views Left  Result Date: 04/13/2016 CLINICAL DATA:  Pain after fall EXAM: LEFT FEMUR 2 VIEWS COMPARISON:  None. FINDINGS: There is an oblique displaced fracture through the left femoral neck. No evidence of dislocation. Visualized pelvic bones are otherwise normal. Remainder of the femur is intact. IMPRESSION: Oblique displaced fracture through the left femoral neck. Electronically Signed   By: Gerome Sam III M.D   On: 04/13/2016 18:50        Scheduled Meds: . aspirin  81 mg Oral Daily  . atorvastatin  20 mg Oral q1800  . cholecalciferol  1,000 Units Oral Daily  . docusate sodium  100 mg Oral BID  . enoxaparin (LOVENOX) injection  30 mg Subcutaneous Q24H  . folic acid  1 mg Oral Daily  . methotrexate  2.5 mg Oral Q Mon  . omega-3 acid ethyl esters  1 g Oral BID  . potassium chloride  20 mEq Oral BID   Continuous Infusions:    LOS: 2 days    Time spent: 30 minutes    Rhetta Mura, MD [facility] Pager [phone]  If 7PM-7AM, please contact night-coverage [email or web address] Password [email or web address] 04/15/2016, 11:33 AM

## 2016-04-15 NOTE — Op Note (Signed)
NAME:  [name] [name]                     ACCOUNT NO.:  MEDICAL RECORD NO.:  [id number]  LOCATION:                                 FACILITY:  PHYSICIAN:  Almedia Balls. Ranell Patrick, M.D.      DATE OF BIRTH:  DATE OF PROCEDURE:  04/13/2016 DATE OF DISCHARGE:                              OPERATIVE REPORT   PREOPERATIVE DIAGNOSIS:  Left displaced femoral neck fracture.  POSTOPERATIVE DIAGNOSIS:  Left displaced femoral neck fracture.  PROCEDURE PERFORMED:  Left hip hemiarthroplasty using DePuy Summit unipolar prosthesis.  ATTENDING SURGEON:  Almedia Balls. Ranell Patrick, M.D.  ASSISTANT:  [name] [name]. Dixon, PA-C, who scrubbed in the entire procedure and necessary for satisfactory completion of surgery.  ANESTHESIA:  General anesthesia was used.  ESTIMATED BLOOD LOSS:  150 mL.  FLUID REPLACEMENT:  1500 mL crystalloid.  INSTRUMENT COUNTS:  Correct.  COMPLICATIONS:  There were no complications.  ANTIBIOTICS:  Perioperative antibiotics were given.  INDICATIONS:  The patient is a 82 year old female who suffered a ground- level fall at home injuring her left hip.  The patient was unable to bear weight on the hip after the fall and presented to [facility] Emergency Department where x-rays obtained demonstrating a displaced femoral neck fracture on the left, as well as a left distal clavicle fracture and a left second rib fracture.  The patient was counseled regarding options for management recommending hemiarthroplasty of the left hip to restore proximal femoral anatomy and allow her to weight bear.  She agreed and family was there present and agreed as well. Informed consent obtained.  DESCRIPTION OF PROCEDURE:  After an adequate level of anesthesia was achieved, the patient was positioned in the right lateral decubitus position with the left hip up.  An axillary roll was placed to pad the axilla properly, the down leg was padded appropriately.  The patient was secured to the operating room table  and all neurovascular structures were padded.  After a sterile prep and drape of the left leg, a time-out was called.  We then went ahead and initiated a standard posterior approach to the hip dissection starting at the vastus ridge and extending posteriorly along the gluteus maximus fiber direction, dissection down through subcutaneous tissues.  The tensor fascia lata identified and split in line with the skin incision.  The short external rotators taken off the hip as the gluteus medius was retracted.  The posterior capsule was divided and tagged to facilitate retraction.  A fracture hematoma was identified and evacuated.  We then went ahead and resected the femoral neck 1 fingerbreadth above the lesser trochanter with the oscillating saw.  We then went ahead and removed the femoral head and sized it to 47, removed the ligamentum teres from the acetabulum with the Bovie.  The condition of the acetabulum was excellent with no visible arthritis.  At this point, we were ready to do a press-fit with a Tri-Lock stem.  We broached for that removing lateral bone and then beginning with the entry starting broach and then going up to a size 2.  We trialed with the 2 and the standard offset neck and the 47 head  and selected the size 2 press-fit Tri-Lock stem, went ahead and removed the trials, irrigated thoroughly and then was in the process of impacting the stem in the canal and noted there to be a split.  The split was approximately 2 mm in width there at the right femoral neck. At this point, I used a Therapist, nutritional and tried to trace down how far the split went but really could not identify how far.  Even though the stem was stabilized, this was not comfortable with leaving a press-fit stem, specialist Tri-Lock type stem despite even plans for cabling.  So we did go ahead and pass a Zimmer cable posteriorly around the femoral neck and retrieving around the lateral proximal femur and we  were careful to stay right on bone.  Once we had that passed and provisionally tensioned, we went ahead and removed the Tri-Lock stem, which we had used as an extraction device to do that, but once we got it down, I was able to look down the canal and see the fracture line only extend into the lesser trochanter and did not go any further beyond in the canal.  So we did go and tightened up on the cable which reduced the fracture anatomically.  We then selected the Summit Basic System __________ we trialed and was able to get a size 2 Summit Basic stem in place.  Once we had selected the real Summit 2 stem, we selected our size 3 cement restrictor and placed that at the appropriate depth.  We irrigated thoroughly and dried the canal.  We used an 11.5 mm centralizer and placed that on the Summit basic size 2 stem.  We then mixed using vacuum mixing techniques DePuy high viscosity cement and injected that with a cement gun, pressurized the canal and then introduced the 2 stem to the appropriate amount of anteversion.  Once we had that in place, we held until all the cement was hardened, we removed excess cement.  We then went ahead and completed the crimping of the cable and then we trialed with the 0 neck and the 47 head and reduced the hip and had nice stability, was able to place the leg on leg in 90 degrees of internal rotation with no dislocation.  At this point, we actually had to use a bone hook to dislocate the hip, it was stable. Once we got it out, we removed the trial head and neck and we selected the 0 neck adapter and impacted that in the 47 monopolar head and then impacted that on the trunnion and reduced the hip.  Did a final stability check and we had our leg lengths symmetric.  At this point, we irrigated thoroughly.  I closed the posterior capsule with interrupted #1 Ethibond suture.  We then repaired the piriformis tendon back to the gluteus medius/greater trochanter and  then went ahead and did our tensor fascia lata closure with interrupted #1 Vicryl suture followed by 0 and 2-0 layered subcutaneous closure and 4-0 Monocryl for skin.  Steri- Strips applied followed by sterile dressing.  The patient tolerated the surgery well.     Almedia Balls. Ranell Patrick, M.D.     SRN/MEDQ  D:  04/13/2016  T:  04/14/2016  Job:  [id number]

## 2016-04-15 NOTE — Evaluation (Signed)
Clinical/Bedside Swallow Evaluation Patient Details  Name: [name] [name] MRN: [id number] Date of Birth: 30-Sep-1922  Today's Date: 04/15/2016 Time: SLP Start Time (ACUTE ONLY): 1414 SLP Stop Time (ACUTE ONLY): 1431 SLP Time Calculation (min) (ACUTE ONLY): 17 min  Past Medical History:  Past Medical History:  Diagnosis Date  . Arthritis   . Cataract   . Glaucoma    Past Surgical History:  Past Surgical History:  Procedure Laterality Date  . EYE SURGERY    . right hand surgery     HPI:  The patient is a 82 year old female who suffered a ground-level fall at home injuring her left hip. The patient was unable to bear weight on the hip after the fall and presented to [facility] Emergency Department where x-rays obtained demonstrating a displaced femoral neck fracture on the left, as well as a left distal clavicle fracture and a left second rib fracture. Pt underwent left hemiarthroplasty on 04/14/16.    Assessment / Plan / Recommendation Clinical Impression  Pt demonstrates adequate ability to consume PO with no signs of aspiration with 3 oz water test or regular solids. Pt is alert and upright in chair, cognition appears excellent. Pt may continue regular diet and thin liquids. No SLP f/u needed.     Aspiration Risk  Mild aspiration risk    Diet Recommendation Regular;Thin liquid   Liquid Administration via: Cup;Straw Medication Administration: Whole meds with liquid Supervision: Patient able to self feed Compensations: Slow rate;Small sips/bites Postural Changes: Seated upright at 90 degrees    Other  Recommendations Oral Care Recommendations: Oral care BID   Follow up Recommendations None      Frequency and Duration            Prognosis        Swallow Study   General HPI: The patient is a 82 year old female who suffered a ground-level fall at home injuring her left hip. The patient was unable to bear weight on the hip after the fall and presented to [facility]  Emergency Department where x-rays obtained demonstrating a displaced femoral neck fracture on the left, as well as a left distal clavicle fracture and a left second rib fracture. Pt underwent left hemiarthroplasty on 04/14/16.  Type of Study: Bedside Swallow Evaluation Diet Prior to this Study: Regular;Thin liquids Temperature Spikes Noted: No Respiratory Status: Nasal cannula History of Recent Intubation: No Behavior/Cognition: Alert;Cooperative;Pleasant mood Oral Cavity Assessment: Within Functional Limits Oral Care Completed by SLP: No Oral Cavity - Dentition: Missing dentition Vision: Functional for self-feeding Self-Feeding Abilities: Able to feed self Patient Positioning: Upright in chair Baseline Vocal Quality: Normal Volitional Cough: Strong Volitional Swallow: Able to elicit    Oral/Motor/Sensory Function Overall Oral Motor/Sensory Function: Within functional limits   Ice Chips     Thin Liquid Thin Liquid: Within functional limits Presentation: Cup;Straw;Self Fed    Nectar Thick Nectar Thick Liquid: Not tested   Honey Thick Honey Thick Liquid: Not tested   Puree Puree: Within functional limits Presentation: Spoon   Solid   GO   Solid: Within functional limits Presentation: Self Floyce Stakes, MA CCC-SLP [phone]  Madeleine Fenn, Riley Nearing 04/15/2016,2:36 PM

## 2016-04-15 NOTE — Progress Notes (Signed)
Initial Nutrition Assessment  DOCUMENTATION CODES:   Underweight  INTERVENTION:  Provide Boost Breeze po BID, each supplement provides 250 kcal and 9 grams of protein  Encourage adequate PO intake.   NUTRITION DIAGNOSIS:   Inadequate oral intake related to poor appetite as evidenced by meal completion < 50%.  GOAL:   Patient will meet greater than or equal to 90% of their needs  MONITOR:   PO intake, Supplement acceptance, Labs, Weight trends, Skin, I & O's  REASON FOR ASSESSMENT:   Consult Hip fracture protocol  ASSESSMENT:   82 y.o. female with medical history significant of RA presents with mechanical fall and injured left hip.  Immediate L hip pain after fall, unable to bear weight.  Pain worse with movement, better at rest.  Symptoms persistent. Xrays in ED show L femoral neck fx.   PROCEDURE (12/16): ARTHROPLASTY BIPOLAR HIP (HEMIARTHROPLASTY) (Left) DePuy Summit unipolar  Pt reports having a decreased appetite, however still consumes at least 3 meals a day. Current meal completion 35-40%. Diet recall includes eggs, fruit, sausage, and toast for breakfast, a hearty meal with usually beef at lunch, and a salad for dinner. Pt reports she does not consume protein shake (Ensure/Boost) at home as she reports they cause abdominal discomfort. Pt reports usual body weight of ~120 lbs, however time frame of weight loss unknown. RD to order Boost Breeze to aid in caloric and protein needs. Pt encouraged to eat her foods at meals.   Nutrition-Focused physical exam completed. Findings are moderate fat depletion, moderate muscle depletion, and mild edema. Depletion may be related to the natural aging process.   Labs and medications reviewed.   Diet Order:  Diet regular Room service appropriate? Yes; Fluid consistency: Thin  Skin:   (Incision L leg)  Last BM:  12/13  Height:   Ht Readings from Last 1 Encounters:  04/13/16 5\' 3"  (1.6 m)    Weight:   Wt Readings from Last  1 Encounters:  04/13/16 102 lb (46.3 kg)    Ideal Body Weight:  52.27 kg  BMI:  Body mass index is 18.07 kg/m.  Estimated Nutritional Needs:   Kcal:  1350-1550  Protein:  55-65 grams  Fluid:  >/= 1.5 L/day  EDUCATION NEEDS:   No education needs identified at this time  04/15/16, MS, RD, LDN Pager # [phone] After hours/ weekend pager # [phone]

## 2016-04-15 NOTE — Progress Notes (Signed)
   Subjective: 2 Days Post-Op Procedure(s) (LRB): ARTHROPLASTY BIPOLAR HIP (HEMIARTHROPLASTY) (Left)  Pt c/o mild to moderate pain in the left hip today Also c/o mild cervical pain worse today along left trapezius Denies any numbness or tingling distally Otherwise doing fair Patient reports pain as mild.  Objective:   VITALS:   Vitals:   04/14/16 1955 04/15/16 0640  BP: (!) 107/49 136/61  Pulse: 60 87  Resp:    Temp: 99.5 F (37.5 C) 99.1 F (37.3 C)    Left hip incision healing well No drainage or erythema nv intact distally No rashes or edema  LABS  Recent Labs  04/13/16 1727 04/14/16 0400 04/15/16 0400  HGB 15.0 13.0 13.2  HCT 45.0 39.9 39.9  WBC 8.0 12.0* 9.5  PLT 249 212 177     Recent Labs  04/13/16 1727 04/14/16 0400 04/15/16 0400  NA 140 138 139  K 3.9 3.2* 3.3*  BUN 14 8 10   CREATININE 0.94 0.74 0.74  GLUCOSE 86 134* 96     Assessment/Plan: 2 Days Post-Op Procedure(s) (LRB): ARTHROPLASTY BIPOLAR HIP (HEMIARTHROPLASTY) (Left) D/c planning to SNF hopefully tomorrow Continue PT/OT Pain management Pulmonary toilet    Brad Reginae Wolfrey, MPAS, PA-C  04/15/2016, 10:00 AM

## 2016-04-15 NOTE — Clinical Social Work Note (Signed)
CSW spoke with pt's son. Pt's son has visited facilities and has chosen to send his mom to [facility]. Pt's son is going to [facility] in the AM to complete paperwork. Pt will have a bed available at the facility in the morning. MD please provide d/c summary at earliest convenience once pt is medically cleared.  [address], [place] [phone]

## 2016-04-15 NOTE — Clinical Social Work Note (Signed)
CSW provided pt's son with a list of facilities that have made a bed offer. Pt's son reports he is going to visit the facilities and will reach out to CSW once decided. MD spoke with CSW this morning and anticipated d/c for pt is tomorrow, 12/19.  [address], [place] [phone]

## 2016-04-16 ENCOUNTER — Encounter (HOSPITAL_COMMUNITY): Payer: Self-pay | Admitting: Orthopedic Surgery

## 2016-04-16 LAB — CBC
HCT: 38.7 % (ref 36.0–46.0)
Hemoglobin: 13.1 g/dL (ref 12.0–15.0)
MCH: 32.4 pg (ref 26.0–34.0)
MCHC: 33.9 g/dL (ref 30.0–36.0)
MCV: 95.8 fL (ref 78.0–100.0)
PLATELETS: 190 10*3/uL (ref 150–400)
RBC: 4.04 MIL/uL (ref 3.87–5.11)
RDW: 14.4 % (ref 11.5–15.5)
WBC: 9.4 10*3/uL (ref 4.0–10.5)

## 2016-04-16 MED ORDER — POLYETHYLENE GLYCOL 3350 17 G PO PACK: 17.0000 g | PACK | Freq: Every day | ORAL | 0 refills | Status: DC | PRN

## 2016-04-16 MED ORDER — ENOXAPARIN SODIUM 30 MG/0.3ML ~~LOC~~ SOLN
40.0000 mg | SUBCUTANEOUS | Status: DC
Start: 2016-04-16 — End: 2016-04-18

## 2016-04-16 NOTE — Clinical Social Work Note (Signed)
Clinical Social Worker facilitated patient discharge including contacting patient family and facility to confirm patient discharge plans.  Clinical information faxed to facility and family agreeable with plan.  CSW arranged ambulance transport via [facility] to [place].  RN to call [phone] for report prior to discharge.  Clinical Social Worker will sign off for now as social work intervention is no longer needed. Please consult [place] again if new need arises.  [address] [place] [phone]\

## 2016-04-16 NOTE — Discharge Summary (Signed)
Physician Discharge Summary  [name] [name] SNK:[id number] DOB: 1922-05-24 DOA: 04/13/2016  PCP: Alysia Penna, MD  Admit date: 04/13/2016 Discharge date: 04/16/2016  Admitted From: Home Disposition:  [facility]  Recommendations for Outpatient Follow-up:  1. Follow up with PCP in 1-2 weeks 2. Please obtain BMP/CBC in one week 3. Will need OP follow up with Orthopedics 4. \will need lovenox for 4-6 weeks for 2/2 prevention of DVT  Home Health: no Equipment/Devices: no  Discharge Condition: Stable CODE STATUS: FULL Diet recommendation: Heart Healthy  Brief/Interim Summary:  per Admitter  82 y.o.femalewith medical history significant of RA. Lives independently in condo at baseline. Today she had mechanical fall and injured left hip. Immediate L hip pain after fall, unable to bear weight. Pain worse with movement, better at rest. Symptoms persistent. Xrays in ED show L femoral neck fx. Dr. Ranell Patrick has already seen patient and is planning on OR.  Patient underwent arthroplasty bipolar hip (hemiarthroplasty) (left) [facility].  Active Problems:   Hip fracture requiring operative repair, left, closed, initial encounter (HCC)  Closed displaced fx of left femoral neck - Patient s/p ARTHROPLASTY BIPOLAR HIP (HEMIARTHROPLASTY) (Left)[facility] PT/OT consult Pain management per ortho SW consult -need SNF at time of discharge lovenox needed 4-6 post d/c to prevent dvt  RA  - on MTX (takes this on Mondays)   Discharge Diagnoses:  Principal Problem:   Closed displaced fracture of left femoral neck (HCC) Active Problems:   Hip fracture requiring operative repair, left, closed, initial encounter [facility])    Discharge Instructions  Discharge Instructions    Weight bearing as tolerated    Complete by:  As directed    Laterality:  left   Extremity:  Lower     Allergies as of 04/16/2016   No Known Allergies     Medication List     TAKE these medications   aspirin EC 81 MG tablet Take 81 mg by mouth See admin instructions. Take 1 tablet (81 mg) by mouth every morning, may also take another tablet during the day as needed for pain   atorvastatin 20 MG tablet Commonly known as:  LIPITOR Take 10 mg by mouth daily.   cholecalciferol 1000 units tablet Commonly known as:  VITAMIN D Take 1,000 Units by mouth daily.   cromolyn 4 % ophthalmic solution Commonly known as:  OPTICROM Place 1 drop into both eyes 3 (three) times daily.   denosumab 60 MG/ML Soln injection Commonly known as:  PROLIA Inject 60 mg into the skin every 6 (six) months. Administer in upper arm, thigh, or abdomen   enoxaparin 40 MG/0.4ML injection Commonly known as:  LOVENOX Inject 0.4 mLs (40 mg total) into the skin daily. 30 days post op   enoxaparin 30 MG/0.3ML injection Commonly known as:  LOVENOX Inject 0.4 mLs (40 mg total) into the skin daily.   folic acid 1 MG tablet Commonly known as:  FOLVITE Take 1 mg by mouth daily.   HYDROcodone-acetaminophen 5-325 MG tablet Commonly known as:  NORCO Take 0.5-1 tablets by mouth every 6 (six) hours as needed for moderate pain.   methotrexate 2.5 MG tablet Commonly known as:  RHEUMATREX Take 20 mg by mouth every Monday. Caution:Chemotherapy. Protect from light.   polyethylene glycol packet Commonly known as:  MIRALAX / GLYCOLAX Take 17 g by mouth daily as needed for mild constipation.   traMADol 50 MG tablet Commonly known as:  ULTRAM Take 50 mg by mouth daily. For arthritis  pain       Contact information for follow-up providers    NORRIS,STEVEN R, MD. Call in 2 weeks.   Specialty:  Orthopedic Surgery Why:  [phone] Contact information: [address] [place] [address] [phone]            Contact information for after-discharge care    Destination    [facility] .   Specialty:  Skilled Nursing Facility Contact  information: [address] [place] [address] [phone]                 No Known Allergies  Consultations:  Dr. Ranell Patrick of [facility]   Procedures/Studies: Dg Pelvis 1-2 Views  Result Date: 04/13/2016 CLINICAL DATA:  Pain after fall EXAM: PELVIS - 1-2 VIEW COMPARISON:  None FINDINGS: There is an oblique fracture through the left femoral neck with displacement. IMPRESSION: Oblique fracture through the left femoral neck with displacement. No dislocation. Electronically Signed   By: Gerome Sam III M.D   On: 04/13/2016 18:49   Ct Head Wo Contrast  Result Date: 04/13/2016 CLINICAL DATA:  Initial evaluation for acute trauma, fall. Patient with left hip fracture. EXAM: CT HEAD WITHOUT CONTRAST TECHNIQUE: Contiguous axial images were obtained from the base of the skull through the vertex without intravenous contrast. COMPARISON:  Previous MRI from 11/25/2004. FINDINGS: Brain: Generalized age-related cerebral atrophy. Patchy and confluent hypodensity within the periventricular and deep white matter both cerebral hemispheres most consistent with chronic microvascular ischemic changes. Remote lacunar infarct within the left basal ganglia. No acute intracranial hemorrhage. No evidence for acute large vessel territory infarct. No mass lesion, midline shift or mass effect. Ventricular prominence related to global parenchymal volume loss without hydrocephalus. No extra-axial fluid collection. Vascular: No hyperdense vessel. Prominent intracranial atherosclerosis noted. Skull: Scalp soft tissues demonstrate no acute abnormality. Calvarium intact. Sinuses/Orbits: Globes and orbital soft tissues demonstrate no acute abnormality. Patient is status post cataract extraction bilaterally. Paranasal sinuses are clear. No mastoid effusion. IMPRESSION: 1. No acute intracranial process identified. 2. Generalized age-related cerebral atrophy with moderate chronic microvascular  ischemic disease. Electronically Signed   By: Rise Mu M.D.   On: 04/13/2016 19:54   Pelvis Portable  Result Date: 04/14/2016 CLINICAL DATA:  82 year old female status post left hip arthroplasty. EXAM: PORTABLE PELVIS 1-2 VIEWS COMPARISON:  Radiograph dated 04/13/2016 FINDINGS: There has been interval left hip hemiarthroplasty. The arthroplasty components appear intact and in anatomic alignment with the acetabulum. The bones are osteopenic. No new fracture identified. There is extensive degenerative changes of the spine. The soft tissues are grossly unremarkable. Skin staples noted over the left hip. IMPRESSION: Interval left hip hemiarthroplasty appears intact and in anatomic alignment with the acetabular cup. Electronically Signed   By: Elgie Collard M.D.   On: 04/14/2016 02:19   Dg Chest Port 1 View  Result Date: 04/13/2016 CLINICAL DATA:  Fall. Preoperative chest x-ray for left hip fracture. EXAM: PORTABLE CHEST 1 VIEW COMPARISON:  Two-view chest x-ray 06/20/2004. FINDINGS: The heart is enlarged. Atherosclerotic calcifications are present at the aortic arch. There is no edema or effusion. Ill-defined right upper lobe airspace disease is concerning for infection. Bilateral apical scarring is noted. IMPRESSION: 1. Ill-defined right upper lobe airspace disease is concerning for pneumonia. 2. Otherwise stable chronic interstitial coarsening and biapical scarring. 3. Cardiomegaly without failure. 4. Aortic atherosclerosis. Electronically Signed   By: Marin Roberts M.D.   On: 04/13/2016 19:46   Dg Femur Min  2 Views Left  Result Date: 04/13/2016 CLINICAL DATA:  Pain after fall EXAM: LEFT FEMUR 2 VIEWS COMPARISON:  None. FINDINGS: There is an oblique displaced fracture through the left femoral neck. No evidence of dislocation. Visualized pelvic bones are otherwise normal. Remainder of the femur is intact. IMPRESSION: Oblique displaced fracture through the left femoral neck.  Electronically Signed   By: Gerome Sam III M.D   On: 04/13/2016 18:50       Subjective:   Discharge Exam: Vitals:   04/16/16 0100 04/16/16 0535  BP: 121/64 129/70  Pulse: 83 87  Resp: 16 15  Temp: 98.2 F (36.8 C) 98.4 F (36.9 C)   Vitals:   04/15/16 1530 04/15/16 2013 04/16/16 0100 04/16/16 0535  BP: (!) 130/58 129/65 121/64 129/70  Pulse: 77 81 83 87  Resp: 15 15 16 15   Temp: 98.1 F (36.7 C) 98.4 F (36.9 C) 98.2 F (36.8 C) 98.4 F (36.9 C)  TempSrc: Oral Oral Oral Oral  SpO2: 96% 92% 94% 93%  Weight:      Height:        General: Pt is alert, awake, not in acute distress, tol diet, some pain with movement Cardiovascular: RRR, S1/S2 +, no rubs, no gallops Respiratory: CTA bilaterally, no wheezing, no rhonchi Abdominal: Soft, NT, ND, bowel sounds + Extremities: no edema, no cyanosis-post op wound not visualized    The results of significant diagnostics from this hospitalization (including imaging, microbiology, ancillary and laboratory) are listed below for reference.     Microbiology: No results found for this or any previous visit (from the past 240 hour(s)).   Labs: BNP (last 3 results) No results for input(s): BNP in the last 8760 hours. Basic Metabolic Panel:  Recent Labs Lab 04/13/16 1727 04/14/16 0400 04/15/16 0400  NA 140 138 139  K 3.9 3.2* 3.3*  CL 106 101 105  CO2 22 27 24   GLUCOSE 86 134* 96  BUN 14 8 10   CREATININE 0.94 0.74 0.74  CALCIUM 9.5 8.2* 8.0*   Liver Function Tests: No results for input(s): AST, ALT, ALKPHOS, BILITOT, PROT, ALBUMIN in the last 168 hours. No results for input(s): LIPASE, AMYLASE in the last 168 hours. No results for input(s): AMMONIA in the last 168 hours. CBC:  Recent Labs Lab 04/13/16 1727 04/14/16 0400 04/15/16 0400 04/16/16 0617  WBC 8.0 12.0* 9.5 9.4  NEUTROABS 4.3  --   --   --   HGB 15.0 13.0 13.2 13.1  HCT 45.0 39.9 39.9 38.7  MCV 96.4 96.1 95.2 95.8  PLT 249 212 177 190    Cardiac Enzymes: No results for input(s): CKTOTAL, CKMB, CKMBINDEX, TROPONINI in the last 168 hours. BNP: Invalid input(s): POCBNP CBG: No results for input(s): GLUCAP in the last 168 hours. D-Dimer No results for input(s): DDIMER in the last 72 hours. Hgb A1c No results for input(s): HGBA1C in the last 72 hours. Lipid Profile No results for input(s): CHOL, HDL, LDLCALC, TRIG, CHOLHDL, LDLDIRECT in the last 72 hours. Thyroid function studies No results for input(s): TSH, T4TOTAL, T3FREE, THYROIDAB in the last 72 hours.  Invalid input(s): FREET3 Anemia work up No results for input(s): VITAMINB12, FOLATE, FERRITIN, TIBC, IRON, RETICCTPCT in the last 72 hours. Urinalysis    Component Value Date/Time   COLORURINE STRAW (A) 04/13/2016 0055   APPEARANCEUR CLEAR 04/13/2016 0055   LABSPEC 1.009 04/13/2016 0055   PHURINE 7.0 04/13/2016 0055   GLUCOSEU 50 (A) 04/13/2016 0055   HGBUR SMALL (A) 04/13/2016  0055   BILIRUBINUR NEGATIVE 04/13/2016 0055   KETONESUR 20 (A) 04/13/2016 0055   PROTEINUR NEGATIVE 04/13/2016 0055   NITRITE NEGATIVE 04/13/2016 0055   LEUKOCYTESUR NEGATIVE 04/13/2016 0055   Sepsis Labs Invalid input(s): PROCALCITONIN,  WBC,  LACTICIDVEN Microbiology No results found for this or any previous visit (from the past 240 hour(s)).   Time coordinating discharge: Over 30 minutes  SIGNED:   Rhetta Mura, MD  [facility] 04/16/2016, 11:13 AM Pager   If 7PM-7AM, please contact night-coverage [email or web address] Password [email or web address]

## 2016-04-16 NOTE — Progress Notes (Signed)
   Subjective: 3 Days Post-Op Procedure(s) (LRB): ARTHROPLASTY BIPOLAR HIP (HEMIARTHROPLASTY) (Left)  Pt c/o mild pain in the left hip  When asked about the pain she states 'It's there!' Stable for d/c from orthopedic standpoint if family and pt can agree on SNF Patient reports pain as mild.  Objective:   VITALS:   Vitals:   04/16/16 0100 04/16/16 0535  BP: 121/64 129/70  Pulse: 83 87  Resp: 16 15  Temp: 98.2 F (36.8 C) 98.4 F (36.9 C)    Left hip incision healing well nv intact distally No rashes or edema Mild pain with rom of left lower extremity  LABS  Recent Labs  04/14/16 0400 04/15/16 0400 04/16/16 0617  HGB 13.0 13.2 13.1  HCT 39.9 39.9 38.7  WBC 12.0* 9.5 9.4  PLT 212 177 190     Recent Labs  04/13/16 1727 04/14/16 0400 04/15/16 0400  NA 140 138 139  K 3.9 3.2* 3.3*  BUN 14 8 10   CREATININE 0.94 0.74 0.74  GLUCOSE 86 134* 96     Assessment/Plan: 3 Days Post-Op Procedure(s) (LRB): ARTHROPLASTY BIPOLAR HIP (HEMIARTHROPLASTY) (Left) Stable to d/c to SNF per orthopedic standpoint F/u in 2 weeks Tried to page Dr. as asked but pager number was incorrect May call the office if need to speak to Dr. Mahala Menghini or myself    Ranell Patrick, MPAS, PA-C  04/16/2016, 9:19 AM

## 2016-04-16 NOTE — Progress Notes (Signed)
Report called to nurse Rene Kocher at [facility]. Reviewed patient's hospital course, past medical history, and current condition. Patient scheduled to transfer to [facility] via [facility] at 1400 per social work. Will continue to monitor patient until discharge.

## 2016-04-18 ENCOUNTER — Non-Acute Institutional Stay (SKILLED_NURSING_FACILITY): Payer: Medicare Other | Admitting: Internal Medicine

## 2016-04-18 ENCOUNTER — Encounter: Payer: Self-pay | Admitting: Internal Medicine

## 2016-04-18 DIAGNOSIS — M069 Rheumatoid arthritis, unspecified: Secondary | ICD-10-CM | POA: Diagnosis not present

## 2016-04-18 DIAGNOSIS — S72002A Fracture of unspecified part of neck of left femur, initial encounter for closed fracture: Secondary | ICD-10-CM | POA: Diagnosis not present

## 2016-04-18 DIAGNOSIS — F05 Delirium due to known physiological condition: Secondary | ICD-10-CM | POA: Diagnosis not present

## 2016-04-18 DIAGNOSIS — R410 Disorientation, unspecified: Secondary | ICD-10-CM

## 2016-04-18 NOTE — Progress Notes (Signed)
This is a comprehensive admission note to [facility] performed on this date less than 30 days from date of admission. Included are preadmission medical/surgical history;reconciled medication list; family history; social history and comprehensive review of systems.  Corrections and additions to the records were documented . Comprehensive physical exam was also performed. Additionally a clinical summary was entered for each active diagnosis pertinent to this admission in the Problem List to enhance continuity of care.  PCP: Dr Alysia Penna  HPI: Patient was in the hospital 12/16-12/19/17after sustaining hip fracture in a mechanical fall. Denied were any cardiac or neurologic prodrome prior to the fall. Patient underwent hip arthroplasty bipolar on the left. Hospitalization was uncomplicated. She was discharged on Lovenox 4-6 days as DVT prophylaxis. There were 2 different orders for the Lovenox; one for 30 mg daily and the other for 40 mg. According to Up to Date the correct dose will be 40 mg once a day.  Additionally a DO NOT RESUSCITATE form was given to me to complete , but when I discussed this with the patient she appears to want aggressive intervention. This will need to be clarified with her primary care physician at discharge. Discharge CBC was normal. No significant blood loss perioperatively. Creatinine 0.74 and GFR greater than 60.   Past medical and surgical history: Includes glaucoma, dyslipidemia, extrinsic conjunctivitis and osteoarthritis .She is on methotrexate every Monday for rheumatoid arthritis.  Social history:She states she quit smoking in September of this year. She lives by her herself but has someone coming in to clean for her.  Family history:No information was on the chart; this was reviewed and updated. Mother had a stroke & son has diabetes.No cancer or MI.  Review of systems: The patient did appear to be slightly confused, giving the date as  12/19/ 2021. She then said 2001 and finally 2017. When I asked again later ,she realized she misspoke. She could not remember  Rheumatologist's,president's or governor's names.She stated she is interested in history, but she could not name any book she had read She states that she recently had some rectal bleeding but her PCP is aware of this.  She states that the pain in her left hip is improving.  She denies being told she has osteoporosis. I could find no BMD in epic.  Constitutional: No fever,significant weight change, fatigue  Eyes: No redness, discharge, pain, vision change ENT/mouth: No nasal congestion,  purulent discharge, earache,change in hearing ,sore throat  Cardiovascular: No chest pain, palpitations,paroxysmal nocturnal dyspnea, claudication, edema  Respiratory: No cough, sputum production,hemoptysis, DOE , significant snoring,apnea  Gastrointestinal: No heartburn,dysphagia,abdominal pain, nausea / vomiting, melena,change in bowels Genitourinary: No dysuria,hematuria, pyuria,  incontinence, nocturia Dermatologic: No rash, pruritus, change in appearance of skin Neurologic: No dizziness,headache,syncope, seizures, numbness , tingling Psychiatric: No significant anxiety , depression, insomnia, anorexia Endocrine: No change in hair/skin/ nails, excessive thirst, excessive hunger, excessive urination  Hematologic/lymphatic: No significant bruising, lymphadenopathy,abnormal bleeding Allergy/immunology: No itchy/ watery eyes, significant sneezing, urticaria, angioedema  Physical exam:  Pertinent or positive findings:She is thin but appears adequately nourished. The lower lids are prominent. She has a lower partial. The breath sounds are somewhat decreased. Pedal pulses are decreased, especially dorsalis pedis pulses. She has severe rheumatoid changes in the hands with large boss at the left wrist.   General appearance: no acute distress , increased work of breathing is present.     Lymphatic: No lymphadenopathy about the head, neck, axilla . Eyes: No conjunctival inflammation  or lid edema is present. There is no scleral icterus. Ears:  External ear exam shows no significant lesions or deformities.   Nose:  External nasal examination shows no deformity or inflammation. Nasal mucosa are pink and moist without lesions ,exudates Oral exam: lips and gums are healthy appearing.There is no oropharyngeal erythema or exudate . Neck:  No thyromegaly, masses, tenderness noted.    Heart:  Normal rate and regular rhythm. S1 and S2 normal without gallop, murmur, click, rub .  Lungs:Chest clear to auscultation without wheezes, rhonchi,rales , rubs. Abdomen:Bowel sounds are normal. Abdomen is soft and nontender with no organomegaly, hernias,masses. GU: deferred . Extremities:  No cyanosis, clubbing,edema  Neurologic exam : Balance,Rhomberg,finger to nose testing could not be completed due to clinical state Skin: Warm & dry w/o tenting. No significant lesions or rash.  See clinical summary under each active problem in the Problem List with associated updated therapeutic plan

## 2016-04-18 NOTE — Assessment & Plan Note (Addendum)
Monitoring of  CBC & BMET  Prior to  MTX  12/25 does not appear to be necessary based on hospital labs.

## 2016-04-18 NOTE — Assessment & Plan Note (Signed)
PT/OT as tolerated wean narcotics as quickly as pain control allows

## 2016-04-18 NOTE — Patient Instructions (Signed)
See Current Assessment & Plan in Problem List under specific Diagnosis 

## 2016-04-18 NOTE — Assessment & Plan Note (Signed)
Follow-up with PCP after discharge Confusion may be related to the acute perioperative course with  possible medication effect

## 2016-05-01 LAB — HEPATIC FUNCTION PANEL
ALT: 10 U/L (ref 7–35)
AST: 19 U/L (ref 13–35)
Alkaline Phosphatase: 82 U/L (ref 25–125)
BILIRUBIN, TOTAL: 0.9 mg/dL

## 2016-05-01 LAB — CBC AND DIFFERENTIAL
HEMATOCRIT: 44 % (ref 36–46)
HEMOGLOBIN: 14.3 g/dL (ref 12.0–16.0)
Platelets: 379 10*3/uL (ref 150–399)
WBC: 14.8 10^3/mL

## 2016-05-01 LAB — BASIC METABOLIC PANEL
BUN: 21 mg/dL (ref 4–21)
Creatinine: 0.8 mg/dL (ref 0.5–1.1)
GLUCOSE: 99 mg/dL
Potassium: 3.7 mmol/L (ref 3.4–5.3)
Sodium: 142 mmol/L (ref 137–147)

## 2016-05-02 ENCOUNTER — Non-Acute Institutional Stay (SKILLED_NURSING_FACILITY): Payer: Medicare Other | Admitting: Internal Medicine

## 2016-05-02 ENCOUNTER — Encounter: Payer: Self-pay | Admitting: Internal Medicine

## 2016-05-02 DIAGNOSIS — J209 Acute bronchitis, unspecified: Secondary | ICD-10-CM

## 2016-05-02 DIAGNOSIS — D72829 Elevated white blood cell count, unspecified: Secondary | ICD-10-CM

## 2016-05-02 NOTE — Progress Notes (Signed)
   [facility] Room: [address]  [place], [place], [place] [address] [place] [address]  This is a nursing facility follow up for specific acute issue of productive cough and elevated white count.  Interim medical record and care since last [facility] visit was updated with review of diagnostic studies and change in clinical status since last visit were documented.  HPI: Staff reports cough with some yellow sputum. She has not been febrile but her white count was noted be 14,800 with a left shift. Significant is that she is on methotrexate which could compromise her immune system. Chest x-ray 04/26/16 revealed no acute process. She describes clear nasal drainage with intermittent sore throat. Also she had some yellow sputum intermittently as noted. PT/OT reports that she is short of breath after walking 10 feet with O2 sats of 85-88 percent despite nasal oxygen 2 L/m. She is a former smoker. She denies a history of asthma.  Urine was reportedly brown. The patient has been incontinent .Urine culture revealed less than 20,000 colonies  Review of systems:  Constitutional: No fever,significant weight change Eyes: No redness, discharge, pain, vision change ENT/mouth: No nasal congestion,  purulent discharge, earache,change in hearing ,sore throat  Cardiovascular: No chest pain, palpitations,paroxysmal nocturnal dyspnea, claudication, edema  Respiratory: No hemoptysis Gastrointestinal: No heartburn,dysphagia,abdominal pain, nausea / vomiting,rectal bleeding, melena,change in bowels Genitourinary: No dysuria,hematuria, pyuria,  incontinence, nocturia Dermatologic: No rash, pruritus, change in appearance of skin Hematologic/lymphatic: No significant bruising, lymphadenopathy,abnormal bleeding Allergy/immunology: No itchy/ watery eyes, significant sneezing, urticaria, angioedema  Physical exam:  Pertinent or positive findings: she is extremely frail.She ambulates with  a shuffling gait using a walker with standby assist.  Eyelids are prominent without proptosis.R nare boggy. She has a gallop type, irregular rhythm. Breath sounds are decreased posteriorly. Minimal rales are noted of the right anterior chest. Pedal pulses are decreased. She has marked mixed arthritic changes in the hands.   General appearance:no acute distress ,no  increased work of breathing is present.   Lymphatic: No lymphadenopathy about the head, neck, axilla . Eyes: No conjunctival inflammation or lid edema is present. There is no scleral icterus. Ears:  External ear exam shows no significant lesions or deformities.   Nose:  External nasal examination shows no deformity or inflammation. Nasal mucosa are  moist without exudates Oral exam: lips and gums are healthy appearing.There is no oropharyngeal erythema or exudate . Neck:  No thyromegaly, masses, tenderness noted.    Heart:  No murmur, click, rub .  Abdomen:Bowel sounds are normal. Abdomen is soft and nontender with no organomegaly, hernias,masses. GU: deferred  Extremities:  No cyanosis, clubbing,edema  Neurologic exam : Balance,Rhomberg,finger to nose testing could not be completed due to clinical state Skin: Warm & dry w/o tenting. No significant lesions or rash.  #1 acute bronchitis w/o bronchospasm #2 leukocytosis with L shift Plan: Broad-spectrum antibiotics and short burst steroids Increase in oxygen with ambulation

## 2016-05-02 NOTE — Patient Instructions (Signed)
See Current Assessment & Plan  

## 2016-05-07 ENCOUNTER — Non-Acute Institutional Stay (SKILLED_NURSING_FACILITY): Payer: Medicare Other | Admitting: Internal Medicine

## 2016-05-07 ENCOUNTER — Encounter: Payer: Self-pay | Admitting: Internal Medicine

## 2016-05-07 DIAGNOSIS — R7981 Abnormal blood-gas level: Secondary | ICD-10-CM | POA: Diagnosis not present

## 2016-05-07 DIAGNOSIS — R627 Adult failure to thrive: Secondary | ICD-10-CM | POA: Diagnosis not present

## 2016-05-07 DIAGNOSIS — R0689 Other abnormalities of breathing: Secondary | ICD-10-CM

## 2016-05-07 NOTE — Patient Instructions (Signed)
CBC and differential,BMET, and portal chest x-ray will be performed.

## 2016-05-07 NOTE — Progress Notes (Signed)
    Facility Location: [facility]  Room Number: 219-A  Code Status: DNR  PCP: Alysia Penna, MD [address] [place] [address]   The patient was seen today for tentative discharge from [facility] on this date by Marga Melnick MD.   The medical history in this facility was reviewed and summarized and medical problem list was updated. Time spent and note content is documented as follows.  Summary of [facility] medical records: The patient was admitted to the [facility] 04/16/16 following orthopedic surgery for hip fracture. The hospital was uncomplicated. Lovenox was continued for DVT prophylaxis for 6 days. Significant history is that she quit smoking in September of this year. She is on methotrexate weekly for rheumatoid arthritis. She was seen acutely 05/02/16 for productive cough and elevated white count. Chest x-ray was reported as normal. Cough with yellow sputum was reported ;white count was 14,800 with a left shift. Because of the potential  for immunosuppression with methotrexate, broad-spectrum antibiotics as Augmentin X 7 days  and a burst of steroids were initiated. Today she describes "slight shortness of breath", worse with cough. The cough is nonproductive. She vomited once this morning. She also describes pressure in the left ear with the cough. O2 sats are 83-84% on room air.  ROS: She denies chest pain, palpitations, or edema. She has no other active GI symptoms. She also denies extrinsic symptoms.  Physical exam:  Pertinent or positive findings: The patient is very frail and profoundly weak. When asked to sit up to auscultate the chest she had to roll to her left and pull up on the bedrails. She had some wax in the right otic canal. O2 sats are 91% on nasal oxygen. She has expiratory rhonchi on the right and rub like rhonchi @ the bases. She is wearing TED hose. No edema is apparent. Pedal pulses are  decreased. She has severe mixed arthritic changes. The joint changes are most striking at the MCP and DIP joints.  General appearance: no acute distress , increased work of breathing is present.   Lymphatic: No lymphadenopathy about the head, neck, axilla . Eyes: No conjunctival inflammation or lid edema is present. There is no scleral icterus. Ears:  External ear exam shows no significant lesions or deformities.   Nose:  External nasal examination shows no deformity or inflammation. Nasal mucosa are pink and moist without lesions ,exudates Oral exam: lips and gums are healthy appearing.There is no oropharyngeal erythema or exudate . Neck:  No thyromegaly, masses, tenderness noted.    Heart:  Slightly irregular rhythm. S1 and S2 normal without gallop, murmur, click, rub .  Abdomen:Bowel sounds are normal. Abdomen is soft and nontender with no organomegaly, hernias,masses. GU: deferred  Extremities:  No cyanosis, clubbing  Neurologic exam : Balance,Rhomberg,finger to nose testing could not be completed due to clinical state Skin: Warm & dry w/o tenting. No significant lesions or rash.  #1 hypoxia #2 abnormal breath sounds #3 failure to thrive #4 former smoker Plan: CBC and differential, chest x-ray. Patient is not clinically stable for discharge.

## 2016-07-01 ENCOUNTER — Ambulatory Visit (INDEPENDENT_AMBULATORY_CARE_PROVIDER_SITE_OTHER): Payer: Medicare Other | Admitting: Pulmonary Disease

## 2016-07-01 ENCOUNTER — Encounter: Payer: Self-pay | Admitting: Pulmonary Disease

## 2016-07-01 VITALS — BP 130/80 | HR 65 | Ht 62.0 in | Wt 103.8 lb

## 2016-07-01 DIAGNOSIS — J9601 Acute respiratory failure with hypoxia: Secondary | ICD-10-CM | POA: Diagnosis not present

## 2016-07-01 DIAGNOSIS — M069 Rheumatoid arthritis, unspecified: Secondary | ICD-10-CM

## 2016-07-01 DIAGNOSIS — H409 Unspecified glaucoma: Secondary | ICD-10-CM | POA: Insufficient documentation

## 2016-07-01 NOTE — Progress Notes (Signed)
Subjective:    Patient ID: [name] [name], female    DOB: Jan 09, 1923, 83 y.o.   MRN: [id number]  HPI Patient was hospitalized in December 2017 for hip fracture that underwent surgical repair. Patient was subsequently discharged after a stay in rehabilitation. Reportedly has a known history of COPD but was also told that she needed oxygen therapy. She reports she was discharged to home on oxygen. She hasn't used her oxygen in 2 weeks. She feels like her breathing is "good". She does have some instability with her walking but does use a wheelchair as well as a rolling walker at times. No dyspnea or chest discomfort with exertion. No coughing or wheezing. No chest pain or pressure. No prior history of breathing tests. She reports while she was in rehab she was diagnosed with Influenza. No recent fever or chills. No abdominal pain or nausea on a regular basis. No history of allergies or breathing problems as a child. No history of recurrent bronchitis.   Review of Systems No headaches or near syncope recently. No new focal weakness or numbness. A pertinent 14 point review of systems is negative except as per the history of presenting illness.  No Known Allergies  Current Outpatient Prescriptions on File Prior to Visit  Medication Sig Dispense Refill  . aspirin EC 81 MG tablet Take one tablet by mouth once daily; Take 1 tablet (81 mg) by mouth once daily as needed for pain. Do not crush    . atorvastatin (LIPITOR) 10 MG tablet Take 10 mg by mouth daily.    . cholecalciferol (VITAMIN D) 1000 UNITS tablet Take 1,000 Units by mouth daily.    . cromolyn (OPTICROM) 4 % ophthalmic solution Place 1 drop into both eyes 3 (three) times daily.    Marland Kitchen denosumab (PROLIA) 60 MG/ML SOLN injection Inject 60 mg into the skin every 6 (six) months. Administer in upper arm, thigh, or abdomen    . folic acid (FOLVITE) 1 MG tablet Take 1 mg by mouth daily.     Marland Kitchen HYDROcodone-acetaminophen (NORCO) 5-325 MG tablet Take 0.5-1  tablets by mouth every 6 (six) hours as needed for moderate pain. 30 tablet 0  . methotrexate (RHEUMATREX) 2.5 MG tablet Take 20 mg by mouth every Monday. Caution:Chemotherapy. Protect from light.    . Nutritional Supplements (NUTRA SHAKE PO) Take by mouth 2 (two) times daily.    . polyethylene glycol (MIRALAX / GLYCOLAX) packet Take 17 g by mouth daily as needed for mild constipation. 14 each 0  . traMADol (ULTRAM) 50 MG tablet Take 50 mg by mouth daily. For arthritis pain    . OXYGEN Inhale 3 L into the lungs. With PT/OT     No current facility-administered medications on file prior to visit.     Past Medical History:  Diagnosis Date  . Arthritis   . Cataract   . Glaucoma     Past Surgical History:  Procedure Laterality Date  . EYE SURGERY    . HIP ARTHROPLASTY Left 04/13/2016   Procedure: ARTHROPLASTY BIPOLAR HIP (HEMIARTHROPLASTY);  Surgeon: Beverely Low, MD;  Location: [facility] OR;  Service: Orthopedics;  Laterality: Left;  . right hand surgery      Family History  Problem Relation Age of Onset  . Stroke Father   . Stroke Sister   . Anemia Sister   . Lung disease Neg Hx     Social History   Social History  . Marital status: Widowed    Spouse name: N/A  .  Number of children: N/A  . Years of education: N/A   Social History Main Topics  . Smoking status: Former Smoker    Packs/day: 0.50    Types: Cigarettes    Start date: 06/28/1942    Quit date: 04/13/2016  . Smokeless tobacco: Never Used     Comment: Stopped for a total of 2 years  . Alcohol use No  . Drug use: No  . Sexual activity: Not Asked   Other Topics Concern  . None   Social History Narrative   [facility] (07/01/16):   Originally from [facility] [facility]. 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 No pets currently. She lives by herself at a condo.       Objective:   Physical Exam BP 130/80 (BP Location: Right Arm, Patient Position: Sitting, Cuff Size: Small)   Pulse 65   Ht 5\' 2"   (1.575 m)   Wt 103 lb 12.8 oz (47.1 kg)   SpO2 97%   BMI 18.99 kg/m  General:  Awake. Alert. No acute distress. Son with patient today. Integument:  Warm & dry. No rash on exposed skin. Dressing in place over left ankle laceration that is clean and dry.  Extremities:  No cyanosis or clubbing.  Lymphatics:  No appreciated cervical or supraclavicular lymphadenoapthy. HEENT:  Moist mucus membranes. No oral ulcers. No scleral injection or icterus.  Cardiovascular:  Regular rate. No edema. No appreciable JVD.  Pulmonary:  Mild bilateral basilar crackles. Symmetric chest wall expansion. No accessory muscle use on room air. Abdomen: Soft. Normal bowel sounds. Nondistended. Grossly nontender. Musculoskeletal:  Normal bulk and tone. Deformity of bilateral MCP, DIP, and PIP joints. No joint effusion appreciated. Neurological:  CN 2-12 grossly in tact. No meningismus. Moving all 4 extremities equally. Symmetric brachioradialis deep tendon reflexes. Psychiatric:  Mood and affect congruent. Speech normal rhythm, rate & tone.   WALK TEST 07/01/16:  Walked 3 laps / Nadir Saturation 97% on Room Air   IMAGING PORT CXR 04/13/16 (personally reviewed by me): No parenchymal opacity or mass appreciated. No pleural effusion. Questionable borderline cardiomegaly.    Assessment & Plan:  83 y.o. female with underlying rheumatoid arthritis. Patient was hospitalized for a hip fracture and then while in rehabilitation did develop influenza infection with acute hypoxic respiratory failure. Instructed patient to contact my office if she develops any new breathing problems or persistent cough. Her chest x-ray does not indicate any obvious parenchymal lung disease but I did explain that she may have mild interstitial lung disease from her underlying rheumatoid arthritis beyond the resolution of her chest x-ray imaging. In the absence of symptoms I do not feel that further testing would be of great benefit to the patient as I  would simply be continuing her methotrexate for now.  1. Acute hypoxic respiratory failure: Resolved. 2. Rheumatoid arthritis: Patient continuing on methotrexate. Suspect mild underlying interstitial lung disease based on physical exam. Holding off on further testing in the absence of symptoms. 3. Follow-up: Patient will return to clinic as needed.  99 Donna Christen, M.D. [facility] Pager:  [phone] After 3pm or if no response, call [phone] 3:38 PM 07/01/16

## 2016-07-01 NOTE — Patient Instructions (Signed)
   Call me if you have any new breathing problems or develop a new cough.  I will see you back as needed.  Remember to keep in mind that your Rheumatoid Arthritis could be affecting your lungs but this is likely mild at this point. It could get worse and you would notice this with increased shortness of breath or coughing.

## 2016-08-20 ENCOUNTER — Inpatient Hospital Stay (HOSPITAL_COMMUNITY): Payer: Medicare Other

## 2016-08-20 ENCOUNTER — Inpatient Hospital Stay (HOSPITAL_COMMUNITY): Payer: Medicare Other | Admitting: Certified Registered Nurse Anesthetist

## 2016-08-20 ENCOUNTER — Encounter (HOSPITAL_COMMUNITY)
Admission: EM | Disposition: A | Discharge status: Skilled Nursing Facility | Payer: Self-pay | Source: Home / Self Care | Attending: Internal Medicine

## 2016-08-20 ENCOUNTER — Emergency Department (HOSPITAL_COMMUNITY): Payer: Medicare Other

## 2016-08-20 ENCOUNTER — Encounter (HOSPITAL_COMMUNITY): Payer: Self-pay | Admitting: Obstetrics and Gynecology

## 2016-08-20 ENCOUNTER — Inpatient Hospital Stay (HOSPITAL_COMMUNITY)
Admission: EM | Admit: 2016-08-20 | Discharge: 2016-08-22 | DRG: 482 | Disposition: A | Discharge status: Skilled Nursing Facility | Payer: Medicare Other | Attending: Internal Medicine | Admitting: Internal Medicine

## 2016-08-20 DIAGNOSIS — Z7982 Long term (current) use of aspirin: Secondary | ICD-10-CM | POA: Diagnosis not present

## 2016-08-20 DIAGNOSIS — M810 Age-related osteoporosis without current pathological fracture: Secondary | ICD-10-CM | POA: Diagnosis present

## 2016-08-20 DIAGNOSIS — M25552 Pain in left hip: Secondary | ICD-10-CM

## 2016-08-20 DIAGNOSIS — S72141A Displaced intertrochanteric fracture of right femur, initial encounter for closed fracture: Principal | ICD-10-CM | POA: Diagnosis present

## 2016-08-20 DIAGNOSIS — Z87891 Personal history of nicotine dependence: Secondary | ICD-10-CM | POA: Diagnosis not present

## 2016-08-20 DIAGNOSIS — W1830XA Fall on same level, unspecified, initial encounter: Secondary | ICD-10-CM | POA: Diagnosis present

## 2016-08-20 DIAGNOSIS — R739 Hyperglycemia, unspecified: Secondary | ICD-10-CM | POA: Diagnosis present

## 2016-08-20 DIAGNOSIS — S72143G Displaced intertrochanteric fracture of unspecified femur, subsequent encounter for closed fracture with delayed healing: Secondary | ICD-10-CM

## 2016-08-20 DIAGNOSIS — F0390 Unspecified dementia without behavioral disturbance: Secondary | ICD-10-CM | POA: Diagnosis present

## 2016-08-20 DIAGNOSIS — S72001A Fracture of unspecified part of neck of right femur, initial encounter for closed fracture: Secondary | ICD-10-CM | POA: Diagnosis not present

## 2016-08-20 DIAGNOSIS — E785 Hyperlipidemia, unspecified: Secondary | ICD-10-CM | POA: Diagnosis present

## 2016-08-20 DIAGNOSIS — M069 Rheumatoid arthritis, unspecified: Secondary | ICD-10-CM | POA: Diagnosis present

## 2016-08-20 DIAGNOSIS — Z96642 Presence of left artificial hip joint: Secondary | ICD-10-CM | POA: Diagnosis present

## 2016-08-20 DIAGNOSIS — D72829 Elevated white blood cell count, unspecified: Secondary | ICD-10-CM | POA: Diagnosis present

## 2016-08-20 DIAGNOSIS — Z09 Encounter for follow-up examination after completed treatment for conditions other than malignant neoplasm: Secondary | ICD-10-CM

## 2016-08-20 DIAGNOSIS — W19XXXA Unspecified fall, initial encounter: Secondary | ICD-10-CM | POA: Diagnosis not present

## 2016-08-20 DIAGNOSIS — Z823 Family history of stroke: Secondary | ICD-10-CM

## 2016-08-20 DIAGNOSIS — M25551 Pain in right hip: Secondary | ICD-10-CM

## 2016-08-20 DIAGNOSIS — R030 Elevated blood-pressure reading, without diagnosis of hypertension: Secondary | ICD-10-CM | POA: Diagnosis present

## 2016-08-20 DIAGNOSIS — E784 Other hyperlipidemia: Secondary | ICD-10-CM | POA: Diagnosis not present

## 2016-08-20 DIAGNOSIS — R7309 Other abnormal glucose: Secondary | ICD-10-CM | POA: Diagnosis not present

## 2016-08-20 DIAGNOSIS — Z5189 Encounter for other specified aftercare: Secondary | ICD-10-CM | POA: Diagnosis not present

## 2016-08-20 DIAGNOSIS — H409 Unspecified glaucoma: Secondary | ICD-10-CM | POA: Diagnosis present

## 2016-08-20 DIAGNOSIS — E876 Hypokalemia: Secondary | ICD-10-CM | POA: Diagnosis present

## 2016-08-20 DIAGNOSIS — W19XXXD Unspecified fall, subsequent encounter: Secondary | ICD-10-CM | POA: Diagnosis not present

## 2016-08-20 HISTORY — PX: FEMUR IM NAIL: SHX1597

## 2016-08-20 LAB — I-STAT CG4 LACTIC ACID, ED: Lactic Acid, Venous: 1.48 mmol/L (ref 0.5–1.9)

## 2016-08-20 LAB — CBC WITH DIFFERENTIAL/PLATELET
Basophils Absolute: 0 10*3/uL (ref 0.0–0.1)
Basophils Relative: 0 %
Eosinophils Absolute: 0 10*3/uL (ref 0.0–0.7)
Eosinophils Relative: 0 %
HEMATOCRIT: 41.9 % (ref 36.0–46.0)
HEMOGLOBIN: 13.7 g/dL (ref 12.0–15.0)
LYMPHS ABS: 0.8 10*3/uL (ref 0.7–4.0)
Lymphocytes Relative: 6 %
MCH: 29.9 pg (ref 26.0–34.0)
MCHC: 32.7 g/dL (ref 30.0–36.0)
MCV: 91.5 fL (ref 78.0–100.0)
MONOS PCT: 5 %
Monocytes Absolute: 0.8 10*3/uL (ref 0.1–1.0)
NEUTROS ABS: 13.1 10*3/uL — AB (ref 1.7–7.7)
NEUTROS PCT: 89 %
Platelets: 262 10*3/uL (ref 150–400)
RBC: 4.58 MIL/uL (ref 3.87–5.11)
RDW: 15.5 % (ref 11.5–15.5)
WBC: 14.7 10*3/uL — ABNORMAL HIGH (ref 4.0–10.5)

## 2016-08-20 LAB — COMPREHENSIVE METABOLIC PANEL
ALBUMIN: 3.4 g/dL — AB (ref 3.5–5.0)
ALT: 15 U/L (ref 14–54)
ANION GAP: 10 (ref 5–15)
AST: 31 U/L (ref 15–41)
Alkaline Phosphatase: 64 U/L (ref 38–126)
BUN: 15 mg/dL (ref 6–20)
CHLORIDE: 106 mmol/L (ref 101–111)
CO2: 25 mmol/L (ref 22–32)
Calcium: 8.7 mg/dL — ABNORMAL LOW (ref 8.9–10.3)
Creatinine, Ser: 0.71 mg/dL (ref 0.44–1.00)
GFR calc Af Amer: >60 mL/min (ref >60)
GFR calc non Af Amer: >60 mL/min (ref >60)
GLUCOSE: 124 mg/dL — AB (ref 65–99)
Potassium: 3.8 mmol/L (ref 3.5–5.1)
SODIUM: 141 mmol/L (ref 135–145)
Total Bilirubin: 1 mg/dL (ref 0.3–1.2)
Total Protein: 7.1 g/dL (ref 6.5–8.1)

## 2016-08-20 LAB — CK: Total CK: 108 U/L (ref 38–234)

## 2016-08-20 LAB — I-STAT TROPONIN, ED: Troponin i, poc: 0 ng/mL (ref 0.00–0.08)

## 2016-08-20 LAB — TYPE AND SCREEN
ABO/RH(D): A POS
Antibody Screen: NEGATIVE

## 2016-08-20 LAB — PROTIME-INR
INR: 1.02
Prothrombin Time: 13.4 seconds (ref 11.4–15.2)

## 2016-08-20 SURGERY — INSERTION, INTRAMEDULLARY ROD, FEMUR
Anesthesia: Monitor Anesthesia Care | Site: Hip | Laterality: Right

## 2016-08-20 MED ORDER — DOCUSATE SODIUM 100 MG PO CAPS
100.0000 mg | ORAL_CAPSULE | Freq: Two times a day (BID) | ORAL | Status: DC
  Administered 2016-08-21 – 2016-08-22 (×3): 100 mg via ORAL
  Filled 2016-08-20 (×3): qty 1

## 2016-08-20 MED ORDER — ONDANSETRON HCL 4 MG/2ML IJ SOLN
INTRAMUSCULAR | Status: AC
  Filled 2016-08-20: qty 2

## 2016-08-20 MED ORDER — MORPHINE SULFATE (PF) 4 MG/ML IV SOLN: 0.5000 mg | INTRAVENOUS | Status: DC | PRN

## 2016-08-20 MED ORDER — BUPIVACAINE HCL (PF) 0.5 % IJ SOLN
INTRAMUSCULAR | Status: DC | PRN
  Administered 2016-08-20: 10 mg via INTRATHECAL

## 2016-08-20 MED ORDER — HYDROCODONE-ACETAMINOPHEN 5-325 MG PO TABS: 1.0000 | ORAL_TABLET | Freq: Four times a day (QID) | ORAL | Status: DC | PRN

## 2016-08-20 MED ORDER — ISOPROPYL ALCOHOL 70 % SOLN
Status: AC
  Filled 2016-08-20: qty 480

## 2016-08-20 MED ORDER — PHENOL 1.4 % MT LIQD
1.0000 | OROMUCOSAL | Status: DC | PRN
  Filled 2016-08-20: qty 177

## 2016-08-20 MED ORDER — ENOXAPARIN SODIUM 30 MG/0.3ML ~~LOC~~ SOLN
30.0000 mg | SUBCUTANEOUS | Status: DC
  Administered 2016-08-21 – 2016-08-22 (×2): 30 mg via SUBCUTANEOUS
  Filled 2016-08-20 (×2): qty 0.3

## 2016-08-20 MED ORDER — ATORVASTATIN CALCIUM 10 MG PO TABS
10.0000 mg | ORAL_TABLET | Freq: Every day | ORAL | Status: DC
  Administered 2016-08-21 – 2016-08-22 (×2): 10 mg via ORAL
  Filled 2016-08-20 (×2): qty 1

## 2016-08-20 MED ORDER — METOCLOPRAMIDE HCL 5 MG PO TABS: 5.0000 mg | ORAL_TABLET | Freq: Three times a day (TID) | ORAL | Status: DC | PRN

## 2016-08-20 MED ORDER — POLYETHYLENE GLYCOL 3350 17 G PO PACK: 17.0000 g | PACK | Freq: Every day | ORAL | Status: DC | PRN

## 2016-08-20 MED ORDER — SODIUM CHLORIDE 0.9 % IV SOLN: INTRAVENOUS | Status: DC

## 2016-08-20 MED ORDER — KETAMINE HCL 10 MG/ML IJ SOLN
INTRAMUSCULAR | Status: DC | PRN
  Administered 2016-08-20 (×2): 10 mg via INTRAVENOUS

## 2016-08-20 MED ORDER — ONDANSETRON HCL 4 MG/2ML IJ SOLN: 4.0000 mg | Freq: Four times a day (QID) | INTRAMUSCULAR | Status: DC | PRN

## 2016-08-20 MED ORDER — KETAMINE HCL 10 MG/ML IJ SOLN
INTRAMUSCULAR | Status: AC
  Filled 2016-08-20: qty 1

## 2016-08-20 MED ORDER — LACTATED RINGERS IV SOLN
INTRAVENOUS | Status: DC | PRN
  Administered 2016-08-20: 18:00:00 via INTRAVENOUS

## 2016-08-20 MED ORDER — CEFAZOLIN SODIUM-DEXTROSE 2-4 GM/100ML-% IV SOLN
2.0000 g | Freq: Four times a day (QID) | INTRAVENOUS | Status: AC
  Administered 2016-08-21 (×2): 2 g via INTRAVENOUS
  Filled 2016-08-20 (×2): qty 100

## 2016-08-20 MED ORDER — SODIUM CHLORIDE 0.9 % IR SOLN
Status: DC | PRN
  Administered 2016-08-20: 1000 mL

## 2016-08-20 MED ORDER — FOLIC ACID 1 MG PO TABS
1.0000 mg | ORAL_TABLET | Freq: Every day | ORAL | Status: DC
  Administered 2016-08-21 – 2016-08-22 (×2): 1 mg via ORAL
  Filled 2016-08-20 (×2): qty 1

## 2016-08-20 MED ORDER — FENTANYL CITRATE (PF) 100 MCG/2ML IJ SOLN
INTRAMUSCULAR | Status: AC
  Filled 2016-08-20: qty 2

## 2016-08-20 MED ORDER — ONDANSETRON HCL 4 MG PO TABS: 4.0000 mg | ORAL_TABLET | Freq: Four times a day (QID) | ORAL | Status: DC | PRN

## 2016-08-20 MED ORDER — ONDANSETRON HCL 4 MG/2ML IJ SOLN
INTRAMUSCULAR | Status: DC | PRN
  Administered 2016-08-20: 4 mg via INTRAVENOUS

## 2016-08-20 MED ORDER — PROPOFOL 10 MG/ML IV BOLUS
INTRAVENOUS | Status: AC
  Filled 2016-08-20: qty 40

## 2016-08-20 MED ORDER — FENTANYL CITRATE (PF) 100 MCG/2ML IJ SOLN: 25.0000 ug | INTRAMUSCULAR | Status: DC | PRN

## 2016-08-20 MED ORDER — CHLORHEXIDINE GLUCONATE 4 % EX LIQD: 60.0000 mL | Freq: Once | CUTANEOUS | Status: DC

## 2016-08-20 MED ORDER — ASPIRIN EC 81 MG PO TBEC
81.0000 mg | DELAYED_RELEASE_TABLET | Freq: Every day | ORAL | Status: DC
  Administered 2016-08-21 – 2016-08-22 (×2): 81 mg via ORAL
  Filled 2016-08-20 (×2): qty 1

## 2016-08-20 MED ORDER — METOCLOPRAMIDE HCL 5 MG/ML IJ SOLN: 5.0000 mg | Freq: Three times a day (TID) | INTRAMUSCULAR | Status: DC | PRN

## 2016-08-20 MED ORDER — POVIDONE-IODINE 10 % EX SWAB
2.0000 | Freq: Once | CUTANEOUS | Status: DC
Start: 2016-08-20 — End: 2016-08-20

## 2016-08-20 MED ORDER — MENTHOL 3 MG MT LOZG: 1.0000 | LOZENGE | OROMUCOSAL | Status: DC | PRN

## 2016-08-20 MED ORDER — ACETAMINOPHEN 325 MG PO TABS
650.0000 mg | ORAL_TABLET | Freq: Four times a day (QID) | ORAL | Status: DC | PRN
  Administered 2016-08-22: 03:00:00 650 mg via ORAL
  Filled 2016-08-20: qty 2

## 2016-08-20 MED ORDER — MIDAZOLAM HCL 2 MG/2ML IJ SOLN
INTRAMUSCULAR | Status: AC
  Filled 2016-08-20: qty 2

## 2016-08-20 MED ORDER — HYDROCODONE-ACETAMINOPHEN 5-325 MG PO TABS
1.0000 | ORAL_TABLET | Freq: Four times a day (QID) | ORAL | Status: DC | PRN
  Administered 2016-08-21 – 2016-08-22 (×3): 1 via ORAL
  Filled 2016-08-20 (×3): qty 1

## 2016-08-20 MED ORDER — SENNA 8.6 MG PO TABS
1.0000 | ORAL_TABLET | Freq: Two times a day (BID) | ORAL | Status: DC
  Administered 2016-08-21 – 2016-08-22 (×3): 8.6 mg via ORAL
  Filled 2016-08-20 (×3): qty 1

## 2016-08-20 MED ORDER — CEFAZOLIN SODIUM-DEXTROSE 2-4 GM/100ML-% IV SOLN
INTRAVENOUS | Status: AC
  Filled 2016-08-20: qty 100

## 2016-08-20 MED ORDER — ACETAMINOPHEN 650 MG RE SUPP: 650.0000 mg | Freq: Four times a day (QID) | RECTAL | Status: DC | PRN

## 2016-08-20 MED ORDER — DEXAMETHASONE SODIUM PHOSPHATE 10 MG/ML IJ SOLN
INTRAMUSCULAR | Status: AC
  Filled 2016-08-20: qty 1

## 2016-08-20 MED ORDER — BUPIVACAINE HCL (PF) 0.25 % IJ SOLN
INTRAMUSCULAR | Status: AC
  Filled 2016-08-20: qty 30

## 2016-08-20 MED ORDER — CROMOLYN SODIUM 4 % OP SOLN
1.0000 [drp] | Freq: Three times a day (TID) | OPHTHALMIC | Status: DC
  Administered 2016-08-21 – 2016-08-22 (×4): 1 [drp] via OPHTHALMIC
  Filled 2016-08-20: qty 10

## 2016-08-20 MED ORDER — PROPOFOL 10 MG/ML IV BOLUS
INTRAVENOUS | Status: DC | PRN
  Administered 2016-08-20: 20 mg via INTRAVENOUS

## 2016-08-20 MED ORDER — CEFAZOLIN SODIUM-DEXTROSE 2-4 GM/100ML-% IV SOLN
2.0000 g | INTRAVENOUS | Status: AC
  Administered 2016-08-20: 2 g via INTRAVENOUS

## 2016-08-20 MED ORDER — PROPOFOL 500 MG/50ML IV EMUL
INTRAVENOUS | Status: DC | PRN
  Administered 2016-08-20: 10 ug/kg/min via INTRAVENOUS

## 2016-08-20 MED ORDER — SODIUM CHLORIDE 0.9 % IJ SOLN
INTRAMUSCULAR | Status: AC
  Filled 2016-08-20: qty 50

## 2016-08-20 MED ORDER — HYDRALAZINE HCL 20 MG/ML IJ SOLN: 10.0000 mg | Freq: Three times a day (TID) | INTRAMUSCULAR | Status: DC | PRN

## 2016-08-20 SURGICAL SUPPLY — 37 items
BAG ZIPLOCK 12X15 (MISCELLANEOUS) IMPLANT
BIT DRILL CANN LG 4.3MM (BIT) ×1 IMPLANT
CHLORAPREP W/TINT 26ML (MISCELLANEOUS) ×3 IMPLANT
COVER PERINEAL POST (MISCELLANEOUS) ×3 IMPLANT
COVER SURGICAL LIGHT HANDLE (MISCELLANEOUS) ×3 IMPLANT
DERMABOND ADVANCED (GAUZE/BANDAGES/DRESSINGS) ×2
DERMABOND ADVANCED .7 DNX12 (GAUZE/BANDAGES/DRESSINGS) ×1 IMPLANT
DRAPE C-ARM 42X120 X-RAY (DRAPES) ×3 IMPLANT
DRAPE C-ARMOR (DRAPES) ×3 IMPLANT
DRAPE ORTHO SPLIT 77X108 STRL (DRAPES)
DRAPE STERI IOBAN 125X83 (DRAPES) ×3 IMPLANT
DRAPE SURG ORHT 6 SPLT 77X108 (DRAPES) IMPLANT
DRAPE U-SHAPE 47X51 STRL (DRAPES) ×6 IMPLANT
DRILL BIT CANN LG 4.3MM (BIT) ×3
DRSG MEPILEX BORDER 4X4 (GAUZE/BANDAGES/DRESSINGS) ×3 IMPLANT
ELECT BLADE TIP CTD 4 INCH (ELECTRODE) ×3 IMPLANT
FACESHIELD WRAPAROUND (MASK) ×3 IMPLANT
GAUZE SPONGE 4X4 12PLY STRL (GAUZE/BANDAGES/DRESSINGS) ×3 IMPLANT
GLOVE BIO SURGEON STRL SZ8.5 (GLOVE) ×6 IMPLANT
GLOVE BIOGEL PI IND STRL 8.5 (GLOVE) ×1 IMPLANT
GLOVE BIOGEL PI INDICATOR 8.5 (GLOVE) ×2
GOWN SPEC L3 XXLG W/TWL (GOWN DISPOSABLE) ×6 IMPLANT
GUIDEPIN 3.2X17.5 THRD DISP (PIN) ×3 IMPLANT
HFN 125 DEG 11MM X 180MM (Orthopedic Implant) ×3 IMPLANT
KIT BASIN OR (CUSTOM PROCEDURE TRAY) ×3 IMPLANT
MANIFOLD NEPTUNE II (INSTRUMENTS) ×3 IMPLANT
MARKER SKIN DUAL TIP RULER LAB (MISCELLANEOUS) ×3 IMPLANT
PACK TOTAL JOINT (CUSTOM PROCEDURE TRAY) ×3 IMPLANT
SCREW BONE CORTICAL 5.0X30 (Screw) ×3 IMPLANT
SCREW LAG HIP NAIL 10.5X95 (Screw) ×3 IMPLANT
SUT MNCRL AB 3-0 PS2 18 (SUTURE) ×3 IMPLANT
SUT MON AB 2-0 CT1 36 (SUTURE) ×3 IMPLANT
SUT VIC AB 1 CT1 27 (SUTURE) ×2
SUT VIC AB 1 CT1 27XBRD ANTBC (SUTURE) ×1 IMPLANT
SUT VIC AB 2-0 CT1 27 (SUTURE) ×4
SUT VIC AB 2-0 CT1 27XBRD (SUTURE) ×2 IMPLANT
YANKAUER SUCT BULB TIP NO VENT (SUCTIONS) ×3 IMPLANT

## 2016-08-20 NOTE — ED Notes (Signed)
Hospitalist at bedside 

## 2016-08-20 NOTE — ED Notes (Signed)
Family at bedside. 

## 2016-08-20 NOTE — ED Notes (Signed)
Patient transported to X-ray 

## 2016-08-20 NOTE — ED Notes (Signed)
Patient transported to CT 

## 2016-08-20 NOTE — Anesthesia Preprocedure Evaluation (Addendum)
Anesthesia Evaluation  Patient identified by MRN, date of birth, ID band Patient awake    Reviewed: Allergy & Precautions, NPO status , Patient's Chart, lab work & pertinent test results  Airway Mallampati: II       Dental no notable dental hx. (+) Dental Advisory Given, Caps   Pulmonary former smoker,    Pulmonary exam normal        Cardiovascular negative cardio ROS Normal cardiovascular exam     Neuro/Psych negative neurological ROS  negative psych ROS   GI/Hepatic negative GI ROS, Neg liver ROS,   Endo/Other  negative endocrine ROS  Renal/GU negative Renal ROS  negative genitourinary   Musculoskeletal negative musculoskeletal ROS (+)   Abdominal   Peds negative pediatric ROS (+)  Hematology negative hematology ROS (+)   Anesthesia Other Findings   Reproductive/Obstetrics negative OB ROS                            Anesthesia Physical Anesthesia Plan  ASA: III  Anesthesia Plan: MAC and Spinal   Post-op Pain Management:    Induction:   Airway Management Planned: Natural Airway  Additional Equipment:   Intra-op Plan:   Post-operative Plan:   Informed Consent: I have reviewed the patients History and Physical, chart, labs and discussed the procedure including the risks, benefits and alternatives for the proposed anesthesia with the patient or authorized representative who has indicated his/her understanding and acceptance.   Dental advisory given  Plan Discussed with: CRNA, Anesthesiologist and Surgeon  Anesthesia Plan Comments:        Anesthesia Quick Evaluation                                   Anesthesia Evaluation  Patient identified by MRN, date of birth, ID band Patient awake    Reviewed: Allergy & Precautions, H&P , NPO status , Patient's Chart, lab work & pertinent test results  Airway Mallampati: II   Neck ROM: full    Dental    Pulmonary Current Smoker,    breath sounds clear to auscultation       Cardiovascular negative cardio ROS   Rhythm:regular Rate:Normal     Neuro/Psych    GI/Hepatic   Endo/Other    Renal/GU      Musculoskeletal  (+) Arthritis ,   Abdominal   Peds  Hematology   Anesthesia Other Findings   Reproductive/Obstetrics                             Anesthesia Physical Anesthesia Plan  ASA: II  Anesthesia Plan: General   Post-op Pain Management:    Induction: Intravenous  Airway Management Planned: Oral ETT  Additional Equipment:   Intra-op Plan:   Post-operative Plan: Extubation in OR  Informed Consent: I have reviewed the patients History and Physical, chart, labs and discussed the procedure including the risks, benefits and alternatives for the proposed anesthesia with the patient or authorized representative who has indicated his/her understanding and acceptance.     Plan Discussed with: CRNA, Anesthesiologist and Surgeon  Anesthesia Plan Comments:         Anesthesia Quick Evaluation

## 2016-08-20 NOTE — Anesthesia Postprocedure Evaluation (Addendum)
Anesthesia Post Note  Patient: [name] [name]  Procedure(s) Performed: Procedure(s) (LRB): INTRAMEDULLARY (IM) NAIL FEMORAL RIGHT HIP (Right)  Patient location during evaluation: PACU Anesthesia Type: MAC Level of consciousness: awake and alert Pain management: pain level controlled Vital Signs Assessment: post-procedure vital signs reviewed and stable Respiratory status: spontaneous breathing and respiratory function stable Cardiovascular status: blood pressure returned to baseline and stable Postop Assessment: spinal receding Anesthetic complications: no       Last Vitals:  Vitals:   08/20/16 2115 08/20/16 2130  BP: (!) 105/58 (!) 111/55  Pulse: 84 70  Resp: 17 15  Temp:  36.6 C    Last Pain:  Vitals:   08/20/16 2100  TempSrc:   PainSc: [place]

## 2016-08-20 NOTE — Consult Note (Signed)
ORTHOPAEDIC CONSULTATION  REQUESTING PHYSICIAN: Vassie Loll, MD  PCP:  Alysia Penna, MD  Chief Complaint: Right hip pain  HPI: [name] [name] is a 83 y.o. female who complains of  right hip pain after a fall earlier today. Patient lives by herself. She had right hip pain and inability of the weight-bear. She was brought to the emergency department at [facility], where x-rays revealed a comminuted right intertrochanteric femur fracture. Orthopedic consultation was obtained for management of her right hip fracture. She was seen by the hospitalist, and underwent perioperative risk stratification medical optimization. She denies other injuries.  Past Medical History:  Diagnosis Date  . Arthritis   . Cataract   . Glaucoma    Past Surgical History:  Procedure Laterality Date  . EYE SURGERY    . HIP ARTHROPLASTY Left 04/13/2016   Procedure: ARTHROPLASTY BIPOLAR HIP (HEMIARTHROPLASTY);  Surgeon: Beverely Low, MD;  Location: [facility] OR;  Service: Orthopedics;  Laterality: Left;  . right hand surgery     Social History   Social History  . Marital status: Widowed    Spouse name: N/A  . Number of children: N/A  . Years of education: N/A   Social History Main Topics  . Smoking status: Former Smoker    Packs/day: 0.50    Types: Cigarettes    Start date: 06/28/1942    Quit date: 04/13/2016  . Smokeless tobacco: Never Used     Comment: Stopped for a total of 2 years  . Alcohol use No  . Drug use: No  . Sexual activity: Not Asked   Other Topics Concern  . None   Social History Narrative   [facility] (07/01/16):   Originally from [place]. 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 No pets currently. She lives by herself at a condo.    Family History  Problem Relation Age of Onset  . Stroke Father   . Stroke Sister   . Anemia Sister   . Lung disease Neg Hx    No Known Allergies Prior to Admission medications   Medication Sig Start Date  End Date Taking? Authorizing Provider  aspirin EC 81 MG tablet Take one tablet by mouth once daily; Take 1 tablet (81 mg) by mouth once daily as needed for pain. Do not crush   Yes Historical Provider, MD  atorvastatin (LIPITOR) 10 MG tablet Take 10 mg by mouth daily.   Yes Historical Provider, MD  cromolyn (OPTICROM) 4 % ophthalmic solution Place 1 drop into both eyes 3 (three) times daily. 03/19/16  Yes Historical Provider, MD  denosumab (PROLIA) 60 MG/ML SOLN injection Inject 60 mg into the skin every 6 (six) months. Administer in upper arm, thigh, or abdomen   Yes Historical Provider, MD  folic acid (FOLVITE) 1 MG tablet Take 1 mg by mouth daily.  05/13/13  Yes Historical Provider, MD  methotrexate (RHEUMATREX) 2.5 MG tablet Take 20 mg by mouth every Monday. Caution:Chemotherapy. Protect from light.   Yes Historical Provider, MD  traMADol (ULTRAM) 50 MG tablet Take 50 mg by mouth 2 (two) times daily. For arthritis pain   Yes Historical Provider, MD  Nutritional Supplements (NUTRA SHAKE PO) Take by mouth 2 (two) times daily.    Historical Provider, MD   Dg Chest 1 View  Result Date: 08/20/2016 CLINICAL DATA:  RIGHT hip fracture, bilateral hip pain EXAM: CHEST 1 VIEW COMPARISON:  [id number] FINDINGS: Normal cardiac silhouette. Lungs are hyperinflated. No effusion, infiltrate or pneumothorax.  IMPRESSION: Hyperinflated lungs.  No acute findings. Electronically Signed   By: Genevive Bi M.D.   On: 08/20/2016 15:48   Ct Head Wo Contrast  Result Date: 08/20/2016 CLINICAL DATA:  Recent fall EXAM: CT HEAD WITHOUT CONTRAST CT CERVICAL SPINE WITHOUT CONTRAST TECHNIQUE: Multidetector CT imaging of the head and cervical spine was performed following the standard protocol without intravenous contrast. Multiplanar CT image reconstructions of the cervical spine were also generated. COMPARISON:  04/13/2016 FINDINGS: CT HEAD FINDINGS Brain: Atrophic changes are noted. Scattered areas of decreased attenuation are  noted in the deep white matter consistent with chronic white matter ischemic change. No findings to suggest acute hemorrhage, acute infarction or space-occupying mass lesion are noted. Vascular: No hyperdense vessel or unexpected calcification. Skull: Normal. Negative for fracture or focal lesion. Sinuses/Orbits: No acute finding. Other: None. CT CERVICAL SPINE FINDINGS Alignment: Alignment is within normal limits with the exception of mild degenerative anterolisthesis of C5 on C6 and C6 on C7. Skull base and vertebrae: 7 cervical segments are well visualized. Vertebral body height is well maintained. Facet hypertrophic changes are noted at multiple levels. Disc space narrowing is noted most marked at C6-7. Mild osteophytic changes are seen. No acute fracture or acute facet abnormality is noted. Soft tissues and spinal canal: Carotid calcifications are noted. No acute soft tissue abnormality is seen. Upper chest: Scarring is noted in the apices bilaterally. IMPRESSION: CT of the head: Chronic atrophic and ischemic changes. No acute abnormality noted. CT of cervical spine: Degenerative change without acute abnormality. Electronically Signed   By: Alcide Clever M.D.   On: 08/20/2016 16:17   Ct Cervical Spine Wo Contrast  Result Date: 08/20/2016 CLINICAL DATA:  Recent fall EXAM: CT HEAD WITHOUT CONTRAST CT CERVICAL SPINE WITHOUT CONTRAST TECHNIQUE: Multidetector CT imaging of the head and cervical spine was performed following the standard protocol without intravenous contrast. Multiplanar CT image reconstructions of the cervical spine were also generated. COMPARISON:  04/13/2016 FINDINGS: CT HEAD FINDINGS Brain: Atrophic changes are noted. Scattered areas of decreased attenuation are noted in the deep white matter consistent with chronic white matter ischemic change. No findings to suggest acute hemorrhage, acute infarction or space-occupying mass lesion are noted. Vascular: No hyperdense vessel or unexpected  calcification. Skull: Normal. Negative for fracture or focal lesion. Sinuses/Orbits: No acute finding. Other: None. CT CERVICAL SPINE FINDINGS Alignment: Alignment is within normal limits with the exception of mild degenerative anterolisthesis of C5 on C6 and C6 on C7. Skull base and vertebrae: 7 cervical segments are well visualized. Vertebral body height is well maintained. Facet hypertrophic changes are noted at multiple levels. Disc space narrowing is noted most marked at C6-7. Mild osteophytic changes are seen. No acute fracture or acute facet abnormality is noted. Soft tissues and spinal canal: Carotid calcifications are noted. No acute soft tissue abnormality is seen. Upper chest: Scarring is noted in the apices bilaterally. IMPRESSION: CT of the head: Chronic atrophic and ischemic changes. No acute abnormality noted. CT of cervical spine: Degenerative change without acute abnormality. Electronically Signed   By: Alcide Clever M.D.   On: 08/20/2016 16:17   Dg Knee Complete 4 Views Right  Result Date: 08/20/2016 CLINICAL DATA:  Right knee pain, known proximal femoral fracture. EXAM: RIGHT KNEE - COMPLETE 4+ VIEW COMPARISON:  None. FINDINGS: Slightly limited lateral view of the right knee due to patient inability to position adequately due to known proximal femoral fracture. There is marked medial femorotibial osteoarthritis with bone-on-bone apposition. No fracture about  the right knee is noted. No significant joint effusion is identified. Vascular calcifications are noted along the course of the femoral through tibial arteries. IMPRESSION: Osteoarthritis of the medial femorotibial compartment. No acute osseous abnormality or dislocation of the right knee. Electronically Signed   By: Tollie Eth M.D.   On: 08/20/2016 18:06   Dg Hip Unilat W Or Wo Pelvis 2-3 Views Right  Result Date: 08/20/2016 CLINICAL DATA:  83 year old female status post fall at 0900 hours today, found by son. Bilateral hip pain.  EXAM: DG HIP (WITH OR WITHOUT PELVIS) 2-3V RIGHT COMPARISON:  Pelvis radiograph 04/13/2016 FINDINGS: Previous left hip arthroplasty. Left hip hardware appears stable and intact compared to 2017. The left femoral head component is normally located. Comminuted acute intertrochanteric fracture of the proximal right femur is relatively nondisplaced aside from mild to moderate distraction of the lesser trochanter fragment. Right femoral head remains normally located. No superimposed acute fracture of the pelvis identified. Negative visible bowel gas pattern. IMPRESSION: 1. Comminuted proximal right femur intertrochanteric fracture, relatively nondisplaced. 2. Stable and satisfactory appearance of the left proximal femur arthroplasty. No acute pelvic fracture identified. Electronically Signed   By: Odessa Fleming M.D.   On: 08/20/2016 15:49    Positive ROS: All other systems have been reviewed and were otherwise negative with the exception of those mentioned in the HPI and as above.  Physical Exam: General: Alert, no acute distress Cardiovascular: No pedal edema Respiratory: No cyanosis, no use of accessory musculature GI: No organomegaly, abdomen is soft and non-tender Skin: No lesions in the area of chief complaint Neurologic: Sensation intact distally Psychiatric: Alert, calm and cooperative Lymphatic: No axillary or cervical lymphadenopathy  MUSCULOSKELETAL: Examination of the right lower extremity reveals no skin wounds or lesions. She does have shortening and excellent rotation. She has pain with attempted range of motion of the hip. Assessment of strength and sensation limited by mental status. Her foot is warm and well-perfused.  Assessment: Comminuted right intertrochanteric femur fracture  Plan: I discussed the findings with the patient and her family. I recommended operative stabilization of her right intertrochanteric femur fracture for pain control and to allow immediate mobilization out of bed.  We discussed the risks, benefits, alternatives to intramedullary fixation. We will plan for surgery tonight. All questions were solicited and answered.  The risks, benefits, and alternatives were discussed with the patient. There are risks associated with the surgery including, but not limited to, problems with anesthesia (death), infection, differences in leg length/angulation/rotation, fracture of bones, loosening or failure of implants, malunion, nonunion, hematoma (blood accumulation) which may require surgical drainage, blood clots, pulmonary embolism, nerve injury (foot drop), and blood vessel injury. The patient understands these risks and elects to proceed.    Mcgregor Tinnon, Cloyde Reams, MD Cell [phone]    08/20/2016 6:25 PM

## 2016-08-20 NOTE — H&P (Signed)
History and Physical    [name] [name] MWU:[id number] DOB: [birth year] DOA: 08/20/2016  PCP: Velna Hatchet, MD   I have briefly reviewed patients previous medical reports in [facility].  Patient coming from: home  Chief Complaint: right hip pain; found to have right hip fracture.  HPI: [name] [name] is a 83 y/o female with HLD, mild dementia (still living independent and very active as per family), RA, Glaucoma, osteoporosis and priro left hip fracture (status post replacement); who presented to ED with complaints of right hip pain after unwitnessed mechanical fall. Patient unable to bear weight on her right leg due to pain. No prodromic symptoms leading to fall, but patient slightly confused and not clear into how she fell. Patient denies CP, SOB, nausea, vomiting, abd pain, dysuria, fever, chills or any other complaints.  ED Course: patient had x-ray demonstrated proximal non displaced right hip fracture. Gentle IVF's and analgesics provided. CXR and EKG w/o acute abnormalities. Ortho consulted and planning to performed surgical repair. [facility] called to admit as part of hip fracture protocol.  Review of Systems:  Unable to be fully reviewed as patient was confused. But per family no other pertinent complaints or concerns.  Past Medical History:  Diagnosis Date  . Arthritis   . Cataract   . Glaucoma     Past Surgical History:  Procedure Laterality Date  . EYE SURGERY    . HIP ARTHROPLASTY Left 04/13/2016   Procedure: ARTHROPLASTY BIPOLAR HIP (HEMIARTHROPLASTY);  Surgeon: Netta Cedars, MD;  Location: [place];  Service: Orthopedics;  Laterality: Left;  . right hand surgery      Social History  reports that she quit smoking about 4 months ago. Her smoking use included Cigarettes. She started smoking about 74 years ago. She smoked 0.50 packs per day. She has never used smokeless tobacco. She reports that she does not drink alcohol or use drugs.  No Known Allergies  Family  History  Problem Relation Age of Onset  . Stroke Father   . Stroke Sister   . Anemia Sister   . Lung disease Neg Hx     Prior to Admission medications   Medication Sig Start Date End Date Taking? Authorizing Provider  aspirin EC 81 MG tablet Take one tablet by mouth once daily; Take 1 tablet (81 mg) by mouth once daily as needed for pain. Do not crush   Yes Historical Provider, MD  atorvastatin (LIPITOR) 10 MG tablet Take 10 mg by mouth daily.   Yes Historical Provider, MD  cromolyn (OPTICROM) 4 % ophthalmic solution Place 1 drop into both eyes 3 (three) times daily. 03/19/16  Yes Historical Provider, MD  denosumab (PROLIA) 60 MG/ML SOLN injection Inject 60 mg into the skin every 6 (six) months. Administer in upper arm, thigh, or abdomen   Yes Historical Provider, MD  folic acid (FOLVITE) 1 MG tablet Take 1 mg by mouth daily.  05/13/13  Yes Historical Provider, MD  methotrexate (RHEUMATREX) 2.5 MG tablet Take 20 mg by mouth every Monday. Caution:Chemotherapy. Protect from light.   Yes Historical Provider, MD  traMADol (ULTRAM) 50 MG tablet Take 50 mg by mouth 2 (two) times daily. For arthritis pain   Yes Historical Provider, MD  Nutritional Supplements (NUTRA SHAKE PO) Take by mouth 2 (two) times daily.    Historical Provider, MD    Physical Exam: Vitals:   08/20/16 1337 08/20/16 1634  BP: (!) 161/78 132/79  Pulse: 72 78  Resp: 14 17  Temp:  98 F (36.7 C)   TempSrc: Oral   SpO2: 99% 95%  Weight: 48.1 kg (106 lb)   Height: _0  (1.6 m)    Constitutional: afebrile, no CP, no SOB. Patient in no distress while remaining still. pleasantly confused.  Eyes: PERTLA, lids and conjunctivae normal ENMT: Mucous membranes are moist. Posterior pharynx clear of any exudate or lesions. No thrush. Neck: supple, no masses, no thyromegaly, no JVD Respiratory: clear to auscultation bilaterally, no wheezing, no crackles. Normal respiratory effort. No accessory muscle use.  Cardiovascular: S1 & S2  heard, regular rate and rhythm, no murmurs / rubs / gallops. 2+ pedal pulses. No carotid bruits.  Abdomen: No distension, no tenderness, no masses palpated. No hepatosplenomegaly. Bowel sounds normal.  Musculoskeletal: no clubbing / cyanosis or edema. RLE with external deviation. Decrease range of motion due to pain. Normal muscle tone.  Neurologic: CN 2-12 grossly intact. Sensation intact, DTR normal. Strength 4/5 in all limbs, except on her RLE due to pain. Psychiatric: patient is confused and with impaired insight, oriented to person and place. Normal mood.    Labs on Admission: I have personally reviewed following labs and imaging studies  CBC:  Recent Labs Lab 08/20/16 1512  WBC 14.7*  NEUTROABS 13.1*  HGB 13.7  HCT 41.9  MCV 91.5  PLT 614   Basic Metabolic Panel:  Recent Labs Lab 08/20/16 1512  NA 141  K 3.8  CL 106  CO2 25  GLUCOSE 124*  BUN 15  CREATININE 0.71  CALCIUM 8.7*   Liver Function Tests:  Recent Labs Lab 08/20/16 1512  AST 31  ALT 15  ALKPHOS 64  BILITOT 1.0  PROT 7.1  ALBUMIN 3.4*   Coagulation Profile:  Recent Labs Lab 08/20/16 1512  INR 1.02   Cardiac Enzymes:  Recent Labs Lab 08/20/16 1512  CKTOTAL 108   Urine analysis:    Component Value Date/Time   COLORURINE STRAW (A) 04/13/2016 0055   APPEARANCEUR CLEAR 04/13/2016 0055   LABSPEC 1.009 04/13/2016 0055   PHURINE 7.0 04/13/2016 0055   GLUCOSEU 50 (A) 04/13/2016 0055   HGBUR SMALL (A) 04/13/2016 0055   BILIRUBINUR NEGATIVE 04/13/2016 0055   KETONESUR 20 (A) 04/13/2016 0055   PROTEINUR NEGATIVE 04/13/2016 0055   NITRITE NEGATIVE 04/13/2016 0055   LEUKOCYTESUR NEGATIVE 04/13/2016 0055     Radiological Exams on Admission: Dg Chest 1 View  Result Date: 08/20/2016 CLINICAL DATA:  RIGHT hip fracture, bilateral hip pain EXAM: CHEST 1 VIEW COMPARISON:  [id number] FINDINGS: Normal cardiac silhouette. Lungs are hyperinflated. No effusion, infiltrate or pneumothorax. IMPRESSION:  Hyperinflated lungs.  No acute findings. Electronically Signed   By: Suzy Bouchard M.D.   On: 08/20/2016 15:48   Ct Head Wo Contrast  Result Date: 08/20/2016 CLINICAL DATA:  Recent fall EXAM: CT HEAD WITHOUT CONTRAST CT CERVICAL SPINE WITHOUT CONTRAST TECHNIQUE: Multidetector CT imaging of the head and cervical spine was performed following the standard protocol without intravenous contrast. Multiplanar CT image reconstructions of the cervical spine were also generated. COMPARISON:  04/13/2016 FINDINGS: CT HEAD FINDINGS Brain: Atrophic changes are noted. Scattered areas of decreased attenuation are noted in the deep white matter consistent with chronic white matter ischemic change. No findings to suggest acute hemorrhage, acute infarction or space-occupying mass lesion are noted. Vascular: No hyperdense vessel or unexpected calcification. Skull: Normal. Negative for fracture or focal lesion. Sinuses/Orbits: No acute finding. Other: None. CT CERVICAL SPINE FINDINGS Alignment: Alignment is within normal limits with the exception of mild degenerative  anterolisthesis of C5 on C6 and C6 on C7. Skull base and vertebrae: 7 cervical segments are well visualized. Vertebral body height is well maintained. Facet hypertrophic changes are noted at multiple levels. Disc space narrowing is noted most marked at C6-7. Mild osteophytic changes are seen. No acute fracture or acute facet abnormality is noted. Soft tissues and spinal canal: Carotid calcifications are noted. No acute soft tissue abnormality is seen. Upper chest: Scarring is noted in the apices bilaterally. IMPRESSION: CT of the head: Chronic atrophic and ischemic changes. No acute abnormality noted. CT of cervical spine: Degenerative change without acute abnormality. Electronically Signed   By: Inez Catalina M.D.   On: 08/20/2016 16:17   Ct Cervical Spine Wo Contrast  Result Date: 08/20/2016 CLINICAL DATA:  Recent fall EXAM: CT HEAD WITHOUT CONTRAST CT CERVICAL  SPINE WITHOUT CONTRAST TECHNIQUE: Multidetector CT imaging of the head and cervical spine was performed following the standard protocol without intravenous contrast. Multiplanar CT image reconstructions of the cervical spine were also generated. COMPARISON:  04/13/2016 FINDINGS: CT HEAD FINDINGS Brain: Atrophic changes are noted. Scattered areas of decreased attenuation are noted in the deep white matter consistent with chronic white matter ischemic change. No findings to suggest acute hemorrhage, acute infarction or space-occupying mass lesion are noted. Vascular: No hyperdense vessel or unexpected calcification. Skull: Normal. Negative for fracture or focal lesion. Sinuses/Orbits: No acute finding. Other: None. CT CERVICAL SPINE FINDINGS Alignment: Alignment is within normal limits with the exception of mild degenerative anterolisthesis of C5 on C6 and C6 on C7. Skull base and vertebrae: 7 cervical segments are well visualized. Vertebral body height is well maintained. Facet hypertrophic changes are noted at multiple levels. Disc space narrowing is noted most marked at C6-7. Mild osteophytic changes are seen. No acute fracture or acute facet abnormality is noted. Soft tissues and spinal canal: Carotid calcifications are noted. No acute soft tissue abnormality is seen. Upper chest: Scarring is noted in the apices bilaterally. IMPRESSION: CT of the head: Chronic atrophic and ischemic changes. No acute abnormality noted. CT of cervical spine: Degenerative change without acute abnormality. Electronically Signed   By: Inez Catalina M.D.   On: 08/20/2016 16:17   Dg Knee Complete 4 Views Right  Result Date: 08/20/2016 CLINICAL DATA:  Right knee pain, known proximal femoral fracture. EXAM: RIGHT KNEE - COMPLETE 4+ VIEW COMPARISON:  None. FINDINGS: Slightly limited lateral view of the right knee due to patient inability to position adequately due to known proximal femoral fracture. There is marked medial femorotibial  osteoarthritis with bone-on-bone apposition. No fracture about the right knee is noted. No significant joint effusion is identified. Vascular calcifications are noted along the course of the femoral through tibial arteries. IMPRESSION: Osteoarthritis of the medial femorotibial compartment. No acute osseous abnormality or dislocation of the right knee. Electronically Signed   By: Ashley Royalty M.D.   On: 08/20/2016 18:06   Dg Hip Unilat W Or Wo Pelvis 2-3 Views Right  Result Date: 08/20/2016 CLINICAL DATA:  83 year old female status post fall at 0900 hours today, found by son. Bilateral hip pain. EXAM: DG HIP (WITH OR WITHOUT PELVIS) 2-3V RIGHT COMPARISON:  Pelvis radiograph 04/13/2016 FINDINGS: Previous left hip arthroplasty. Left hip hardware appears stable and intact compared to 2017. The left femoral head component is normally located. Comminuted acute intertrochanteric fracture of the proximal right femur is relatively nondisplaced aside from mild to moderate distraction of the lesser trochanter fragment. Right femoral head remains normally located. No superimposed acute  fracture of the pelvis identified. Negative visible bowel gas pattern. IMPRESSION: 1. Comminuted proximal right femur intertrochanteric fracture, relatively nondisplaced. 2. Stable and satisfactory appearance of the left proximal femur arthroplasty. No acute pelvic fracture identified. Electronically Signed   By: Genevie Ann M.D.   On: 08/20/2016 15:49    EKG:  No acute ischemic changes, regular rate,   Assessment/Plan 1-Closed right hip fracture, initial encounter [facility]): secondary to unwitnessed mechanical fall. -patient with non displaced right hip fracture -plan is for surgery (IM nail) to be performed later on 4/24 by Dr. Lyla Glassing -no weight bearing for now, and PRN analgesics -patient is a mild risk for surgery due to age mainly; no CP, no SOB, no recent cardiac events, normal EKG and MET > 4  -will follow orthopedic  recommendations regarding DVT prophylaxis after surgery and weight bearing tolerance. -will follow hip fx protocol -checking TSH and Vit D level  2-Rheumatoid arthritis ([place]): -will hold chronic methotrexate while inpatient  3-Glaucoma -continue home eye drops  4-Leukocytosis: -no signs of infection appreciated -most likely due to stress demargination with fracture -no abnormalities seen on CXR -will check UA and follow trend  5-Hyperglycemia -no prior hx of diabetes -will repeat fasting glucose and check A1C  6-Dementia -mild and still functional and very independent as per family report   7-Osteoporosis -patient using prolia as an outpatient  8-HLD (hyperlipidemia) -will check lipid panel -continue statins  9-Elevated BP without diagnosis of hypertension -most likely secondary to pain -will follow BP response to analgesics   -will use PRN hydralazine   Time: 50 minutes   DVT prophylaxis: SCD's; surgery plan for later today. DVT prophylaxis to be started by orthopedic service. Code Status: Full Family Communication: son at bedside   Disposition Plan: to be determined. Most likely SNF for rehab before home discharge Consults called: Dr. Lyla Glassing (orthopedic service) called by ED Admission status: inpatient, LOS > 2 midnights, med-surg     Barton Dubois MD [facility] Pager [phone]  If 7PM-7AM, please contact night-coverage [email or web address] Password [email or web address]  08/20/2016, 6:10 PM

## 2016-08-20 NOTE — ED Notes (Signed)
Bed: WA18 Expected date:  Expected time:  Means of arrival:  Comments: EMS 

## 2016-08-20 NOTE — Discharge Instructions (Signed)
 Dr. Lezley Bedgood Adult Hip & Knee Specialist [facility] [address] [place], [place] [address] [phone]   POSTOPERATIVE DIRECTIONS    Hip Rehabilitation, Guidelines Following Surgery   WEIGHT BEARING Weight bearing as tolerated with assist device (walker, cane, etc) as directed, use it as long as suggested by your surgeon or therapist, typically at least 4-6 weeks.   HOME CARE INSTRUCTIONS  Remove items at home which could result in a fall. This includes throw rugs or furniture in walking pathways.  Continue medications as instructed at time of discharge.  You may have some home medications which will be placed on hold until you complete the course of blood thinner medication.  4 days after discharge, you may start showering. No tub baths or soaking your incisions. Do not put on socks or shoes without following the instructions of your caregivers.   Sit on chairs with arms. Use the chair arms to help push yourself up when arising.  Arrange for the use of a toilet seat elevator so you are not sitting low.   Walk with walker as instructed.  You may resume a sexual relationship in one month or when given the OK by your caregiver.  Use walker as long as suggested by your caregivers.  Avoid periods of inactivity such as sitting longer than an hour when not asleep. This helps prevent blood clots.  You may return to work once you are cleared by your surgeon.  Do not drive a car for 6 weeks or until released by your surgeon.  Do not drive while taking narcotics.  Wear elastic stockings for two weeks following surgery during the day but you may remove then at night.  Make sure you keep all of your appointments after your operation with all of your doctors and caregivers. You should call the office at the above phone number and make an appointment for approximately two weeks after the date of your surgery. Please pick up a stool softener and laxative  for home use as long as you are requiring pain medications.  ICE to the affected hip every three hours for 30 minutes at a time and then as needed for pain and swelling. Continue to use ice on the hip for pain and swelling from surgery. You may notice swelling that will progress down to the foot and ankle.  This is normal after surgery.  Elevate the leg when you are not up walking on it.   It is important for you to complete the blood thinner medication as prescribed by your doctor.  Continue to use the breathing machine which will help keep your temperature down.  It is common for your temperature to cycle up and down following surgery, especially at night when you are not up moving around and exerting yourself.  The breathing machine keeps your lungs expanded and your temperature down.  RANGE OF MOTION AND STRENGTHENING EXERCISES  These exercises are designed to help you keep full movement of your hip joint. Follow your caregiver's or physical therapist's instructions. Perform all exercises about fifteen times, three times per day or as directed. Exercise both hips, even if you have had only one joint replacement. These exercises can be done on a training (exercise) mat, on the floor, on a table or on a bed. Use whatever works the best and is most comfortable for you. Use music or television while you are exercising so that the exercises are a pleasant break in your day. This   will make your life better with the exercises acting as a break in routine you can look forward to.  Lying on your back, slowly slide your foot toward your buttocks, raising your knee up off the floor. Then slowly slide your foot back down until your leg is straight again.  Lying on your back spread your legs as far apart as you can without causing discomfort.  Lying on your side, raise your upper leg and foot straight up from the floor as far as is comfortable. Slowly lower the leg and repeat.  Lying on your back, tighten up the  muscle in the front of your thigh (quadriceps muscles). You can do this by keeping your leg straight and trying to raise your heel off the floor. This helps strengthen the largest muscle supporting your knee.  Lying on your back, tighten up the muscles of your buttocks both with the legs straight and with the knee bent at a comfortable angle while keeping your heel on the floor.   SKILLED REHAB INSTRUCTIONS: If the patient is transferred to a skilled rehab facility following release from the hospital, a list of the current medications will be sent to the facility for the patient to continue.  When discharged from the skilled rehab facility, please have the facility set up the patient's Home Health Physical Therapy prior to being released. Also, the skilled facility will be responsible for providing the patient with their medications at time of release from the facility to include their pain medication and their blood thinner medication. If the patient is still at the rehab facility at time of the two week follow up appointment, the skilled rehab facility will also need to assist the patient in arranging follow up appointment in our office and any transportation needs.  MAKE SURE YOU:  Understand these instructions.  Will watch your condition.  Will get help right away if you are not doing well or get worse.  Pick up stool softner and laxative for home use following surgery while on pain medications. Daily dry dressing changes as needed. In 4 days, you may remove your dressings and begin taking showers - no tub baths or soaking the incisions. Continue to use ice for pain and swelling after surgery. Do not use any lotions or creams on the incision until instructed by your surgeon.   
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
## 2016-08-20 NOTE — Transfer of Care (Signed)
Immediate Anesthesia Transfer of Care Note  Patient: [name] [name]  Procedure(s) Performed: Procedure(s): INTRAMEDULLARY (IM) NAIL FEMORAL RIGHT HIP (Right)  Patient Location: PACU  Anesthesia Type:MAC and Spinal  Level of Consciousness:  sedated, patient cooperative and responds to stimulation  Airway & Oxygen Therapy:Patient Spontanous Breathing and Patient connected to face mask oxgen  Post-op Assessment:  Report given to PACU RN and Post -op Vital signs reviewed and stable  Post vital signs:  Reviewed and stable  Last Vitals:  Vitals:   08/20/16 1943 08/20/16 1945  BP: 129/82 137/79  Pulse: (P) 82 89  Resp: (P) 13 12  Temp: (P) 16.0 C     Complications: No apparent anesthesia complications

## 2016-08-20 NOTE — Anesthesia Procedure Notes (Signed)
Spinal  Patient location during procedure: OR Start time: 08/20/2016 6:24 PM End time: 08/20/2016 6:34 PM Staffing Anesthesiologist: Heather Roberts Performed: anesthesiologist  Preanesthetic Checklist Completed: patient identified, surgical consent, pre-op evaluation, timeout performed, IV checked, risks and benefits discussed and monitors and equipment checked Spinal Block Patient position: right lateral decubitus Prep: DuraPrep Patient monitoring: cardiac monitor, continuous pulse ox and blood pressure Approach: midline Location: L3-4 Injection technique: single-shot Needle Needle type: Quincke  Needle gauge: 22 G Needle length: 9 cm Additional Notes Functioning IV was confirmed and monitors were applied. Sterile prep and drape, including hand hygiene and sterile gloves were used. The patient was positioned and the spine was prepped. The skin was anesthetized with lidocaine.  Free flow of clear CSF was obtained prior to injecting local anesthetic into the CSF.  The spinal needle aspirated freely following injection.  The needle was carefully withdrawn.  The patient tolerated the procedure well.

## 2016-08-20 NOTE — ED Triage Notes (Signed)
Per EMS:  Possible hip fracture, bilateral hip pain, some extension to right leg, fell this morning at 9 o clock. Found by son. Pt had urinated  Pain w/o movement 3-4 With movement 8-9  Lives by self. Pt from home  Last vitals 130/78 101 13 RR 98 no O2  No hx except hip fracture

## 2016-08-20 NOTE — ED Notes (Signed)
EKG delayed due to patient going to CT and Xray.

## 2016-08-20 NOTE — Op Note (Signed)
OPERATIVE REPORT  SURGEON: Samson Frederic, MD   ASSISTANT: Alphonsa Overall, PA-C.  PREOPERATIVE DIAGNOSIS: Right intertrochanteric femur fracture.   POSTOPERATIVE DIAGNOSIS: Right intertrochanteric femur fracture.   PROCEDURE: Intramedullary fixation, Right femur.   IMPLANTS: Biomet Affixus hip fracture nail, 11 x 180 mm, 125. Hip fracture nail lag screw 10.5 x 95 mm. 5 x 30 mm distal interlocking screw 1.  ANESTHESIA:  Spinal  ESTIMATED BLOOD LOSS: 50 mL.    ANTIBIOTICS: 2 g Ancef.  DRAINS: None.  COMPLICATIONS: None.   CONDITION: PACU - hemodynamically stable.   BRIEF CLINICAL NOTE: [name] [name] is a 83 y.o. female who presented with an intertrochanteric femur fracture. The patient was admitted to the hospitalist service and underwent perioperative risk stratification and medical optimization. The risks, benefits, and alternatives to the procedure were explained, and the patient elected to proceed.  PROCEDURE IN DETAIL: Surgical site was marked by myself. The patient was taken to the operating room and spinal anesthesia was obtained on the bed. The patient was then transferred to the [facility] table and the nonoperative lower extremity was scissored underneath the operative side. The fracture was reduced with traction, internal rotation, and adduction. The hip was prepped and draped in the normal sterile surgical fashion. Timeout was called verifying side and site of surgery. Preop antibiotics were given with 60 minutes of beginning the procedure.  Fluoroscopy was used to define the patient's anatomy. A 4 cm incision was made just proximal to the tip of the greater trochanter. The awl was used to obtain the standard starting point for a trochanteric entry nail under fluoroscopic control. The guidepin was placed. The entry reamer was used to open the proximal femur.  On the back table, the nail was assembled onto the jig. The nail was placed into the femur without any difficulty. Through  a separate stab incision, the cannula was placed down to the bone in preparation for the cephalomedullary device. A guidepin was placed into the femoral head using AP and lateral fluoroscopy views. The pin was measured, and then reaming was performed to the appropriate depth. The lag screw was inserted to the appropriate depth within the femoral head. The fracture was compressed through the jig. The setscrew was tightened and then loosened one quarter turn. A separate stab incision was created, and the distal interlocking screw was placed using standard AO technique. The jig was removed. Final AP and lateral fluoroscopy views were obtained to confirm fracture reduction and hardware placement. Tip apex distance was appropriate. There was no chondral penetration.  The wounds were copiously irrigated with saline. The wound was closed in layers with #1 Vicryl for the fascia, 2-0 Monocryl for the deep dermal layer, and 3-0 Monocryl subcuticular stitch. Glue was applied to the skin. Once the glue was fully hardened, sterile dressing was applied. The patient was then awakened from anesthesia and taken to the PACU in stable condition. Sponge needle and instrument counts were correct at the end of the case 2. There were no known complications.  We will readmit the patient to the hospitalist. Weightbearing status will be weightbearing as tolerated with a walker. We will begin Lovenox for DVT prophylaxis. The patient will work with physical therapy and undergo disposition planning.  Please note that a surgical assistant was a medical necessity for this procedure to perform it in a safe and expeditious manner. Assistant was necessary to provide appropriate retraction of vital neurovascular structures, to prevent femoral fracture, and to allow for anatomic placement of  the prosthesis.

## 2016-08-20 NOTE — ED Notes (Signed)
ED Provider at bedside. 

## 2016-08-21 ENCOUNTER — Encounter (HOSPITAL_COMMUNITY): Payer: Self-pay | Admitting: Orthopedic Surgery

## 2016-08-21 DIAGNOSIS — R7309 Other abnormal glucose: Secondary | ICD-10-CM

## 2016-08-21 DIAGNOSIS — Z5189 Encounter for other specified aftercare: Secondary | ICD-10-CM

## 2016-08-21 DIAGNOSIS — W19XXXA Unspecified fall, initial encounter: Secondary | ICD-10-CM

## 2016-08-21 DIAGNOSIS — W19XXXD Unspecified fall, subsequent encounter: Secondary | ICD-10-CM

## 2016-08-21 DIAGNOSIS — E784 Other hyperlipidemia: Secondary | ICD-10-CM

## 2016-08-21 DIAGNOSIS — S72143G Displaced intertrochanteric fracture of unspecified femur, subsequent encounter for closed fracture with delayed healing: Secondary | ICD-10-CM

## 2016-08-21 LAB — CBC
HEMATOCRIT: 32.3 % — AB (ref 36.0–46.0)
HEMOGLOBIN: 10.3 g/dL — AB (ref 12.0–15.0)
MCH: 29.5 pg (ref 26.0–34.0)
MCHC: 31.9 g/dL (ref 30.0–36.0)
MCV: 92.6 fL (ref 78.0–100.0)
Platelets: 219 10*3/uL (ref 150–400)
RBC: 3.49 MIL/uL — ABNORMAL LOW (ref 3.87–5.11)
RDW: 15.5 % (ref 11.5–15.5)
WBC: 10.2 10*3/uL (ref 4.0–10.5)

## 2016-08-21 LAB — URINALYSIS, ROUTINE W REFLEX MICROSCOPIC
Bilirubin Urine: NEGATIVE
GLUCOSE, UA: NEGATIVE mg/dL
Ketones, ur: NEGATIVE mg/dL
Nitrite: NEGATIVE
PROTEIN: NEGATIVE mg/dL
Specific Gravity, Urine: 1.016 (ref 1.005–1.030)
pH: 5 (ref 5.0–8.0)

## 2016-08-21 LAB — LIPID PANEL
Cholesterol: 106 mg/dL (ref 0–200)
HDL: 58 mg/dL (ref >40)
LDL Cholesterol: 38 mg/dL (ref 0–99)
TRIGLYCERIDES: 48 mg/dL (ref <150)
Total CHOL/HDL Ratio: 1.8 RATIO
VLDL: 10 mg/dL (ref 0–40)

## 2016-08-21 LAB — TSH: TSH: 2.233 u[IU]/mL (ref 0.350–4.500)

## 2016-08-21 LAB — BASIC METABOLIC PANEL
Anion gap: 9 (ref 5–15)
BUN: 15 mg/dL (ref 6–20)
CHLORIDE: 102 mmol/L (ref 101–111)
CO2: 28 mmol/L (ref 22–32)
CREATININE: 0.75 mg/dL (ref 0.44–1.00)
Calcium: 7.7 mg/dL — ABNORMAL LOW (ref 8.9–10.3)
Glucose, Bld: 124 mg/dL — ABNORMAL HIGH (ref 65–99)
Potassium: 3.1 mmol/L — ABNORMAL LOW (ref 3.5–5.1)
Sodium: 139 mmol/L (ref 135–145)

## 2016-08-21 LAB — ABO/RH: ABO/RH(D): A POS

## 2016-08-21 MED ORDER — ENSURE ENLIVE PO LIQD
237.0000 mL | Freq: Two times a day (BID) | ORAL | Status: DC
  Administered 2016-08-21 – 2016-08-22 (×2): 237 mL via ORAL

## 2016-08-21 MED ORDER — POTASSIUM CHLORIDE CRYS ER 20 MEQ PO TBCR
40.0000 meq | EXTENDED_RELEASE_TABLET | Freq: Once | ORAL | Status: AC
  Administered 2016-08-21: 40 meq via ORAL
  Filled 2016-08-21: qty 2

## 2016-08-21 NOTE — Progress Notes (Signed)
Physical Therapy Treatment Patient Details Name: [name] [name] MRN: [id number] DOB: Dec 29, 1922 Today's Date: 08/21/2016    History of Present Illness  [name] [name] is a 83 y/o female with HLD, mild dementia (still living independent and very active as per family), RA, Glaucoma, osteoporosis and priro left hip fracture (status post replacement); who presented to ED with complaints of right hip pain after unwitnessed mechanical fall.  Pt had R IM nail on R hip Pt is now WBAT.    PT Comments    Pt cooperative but continues pain limited and requiring increased time and assist of 2 for all mobility tasks.   Follow Up Recommendations  SNF     Equipment Recommendations  None recommended by PT    Recommendations for Other Services OT consult     Precautions / Restrictions Precautions Precautions: Fall Restrictions Weight Bearing Restrictions: No Other Position/Activity Restrictions: WBAT    Mobility  Bed Mobility Overal bed mobility: Needs Assistance Bed Mobility: Sit to Supine       Sit to supine: Max assist;+2 for safety/equipment;+2 for physical assistance   General bed mobility comments: cues for sequence and use of L LE to self assist  Transfers Overall transfer level: Needs assistance Equipment used: Rolling walker (2 wheeled) Transfers: Sit to/from [employer] Sit to Stand: Max assist;+2 physical assistance Stand pivot transfers: +2 physical assistance;Mod assist;Max assist       General transfer comment: cues for LE management and use of UEs to self assist.  Stand/pvt with RW but pt unable to step bkwds and bed brought up behind to sit  Ambulation/Gait Ambulation/Gait assistance: +2 physical assistance;+2 safety/equipment;Mod assist;Max assist Ambulation Distance (Feet): 1 Feet Assistive device: Rolling walker (2 wheeled) Gait Pattern/deviations: Step-to pattern;Decreased step length - right;Decreased step length - left;Shuffle;Trunk  flexed Gait velocity: decre Gait velocity interpretation: Below normal speed for age/gender General Gait Details: cues for sequence and foot placement.  Pt able to take 1 step with each foot   Stairs            Wheelchair Mobility    Modified Rankin (Stroke Patients Only)       Balance                                            Cognition Arousal/Alertness: Awake/alert Behavior During Therapy: WFL for tasks assessed/performed Overall Cognitive Status: History of cognitive impairments - at baseline                                 General Comments: mild dementia per chart      Exercises      General Comments        Pertinent Vitals/Pain Pain Assessment: 0-10 Pain Score: 5  Pain Location: R hip Pain Descriptors / Indicators: Aching;Grimacing;Discomfort;Sore Pain Intervention(s): Limited activity within patient's tolerance;Monitored during session;Premedicated before session    Home Living                      Prior Function            PT Goals (current goals can now be found in the care plan section) Acute Rehab PT Goals Patient Stated Goal: get well- i need to be able to go home PT Goal Formulation: With patient Time For Goal Achievement:  08/24/16 Potential to Achieve Goals: Good Progress towards PT goals: Progressing toward goals    Frequency    Min 3X/week      PT Plan Current plan remains appropriate    Co-evaluation             End of Session Equipment Utilized During Treatment: Gait belt Activity Tolerance: Patient limited by fatigue;Patient limited by pain Patient left: in bed;with call bell/phone within reach;with family/visitor present Nurse Communication: Mobility status PT Visit Diagnosis: Unsteadiness on feet (R26.81);History of falling (Z91.81);Difficulty in walking, not elsewhere classified (R26.2)     Time: 7829-5621 PT Time Calculation (min) (ACUTE ONLY): 16 min  Charges:   $Therapeutic Activity: 8-22 mins                    G Codes:       Pg [phone]    Laramie Meissner 08/21/2016, 5:54 PM

## 2016-08-21 NOTE — Progress Notes (Signed)
CSW met with pt's son to provide SNF bed offers. Son has chosen [facility] for pt's rehab. SNF has confirmed bed availability. CSW will continue to follow to assist with d/c planning.    LCSW [phone] 

## 2016-08-21 NOTE — Clinical Social Work Note (Addendum)
Clinical Social Work Assessment  Patient Details  Name: [name] [name] MRN: [id number] Date of Birth: 1922-11-06  Date of referral:  08/21/16               Reason for consult:  Discharge Planning                Permission sought to share information with:  Facility Art therapist granted to share information::  Yes, Verbal Permission Granted  Name::        Agency::     Relationship::     Contact Information:     Housing/Transportation Living arrangements for the past 2 months:  Apartment Source of Information:  Patient Patient Interpreter Needed:  None Criminal Activity/Legal Involvement Pertinent to Current Situation/Hospitalization:  No - Comment as needed Significant Relationships:  Adult Children Lives with:  Self Do you feel safe going back to the place where you live?   (SNF most likely needed.) Need for family participation in patient care:  Yes (Comment)  Care giving concerns:  Pt may require more assistance than available at home following hospital d/c.   Social Worker assessment / plan:  Pt hospitalized on 08/20/16 from home Comminuted right intertrochanteric femur fracture. Pt had hip surgery last night. PT eval is pending. CSW met with pt at bedside to offer support. Pt appeared a little confused about her prior living arrangements. Pt gave CSW permission to contact her son Danne Baxter [phone] to assist with d/c planning. VM left for son. Awaiting return call. PN indicate pt has been to [facility] for rehab in the past. CSW will continue to follow to assist with d/c planning needs.  Employment status:  Retired Nurse, adult PT Recommendations:   (PT Eval pending.) Information / Referral to community resources:     Patient/Family's Response to care: Disposition to be determined.  Patient/Family's Understanding of and Emotional Response to Diagnosis, Current Treatment, and Prognosis:  " I'm really sore. " support / reassurance  provided. Pt is aware she had hip surgery but not sure when.  Emotional Assessment Appearance:  Appears stated age Attitude/Demeanor/Rapport:  Other (cooperative) Affect (typically observed):  Calm, Appropriate Orientation:  Oriented to Self, Oriented to Place, Oriented to Situation Alcohol / Substance use:  Not Applicable Psych involvement (Current and /or in the community):  Outpatient Provider  Discharge Needs  Concerns to be addressed:  Discharge Planning Concerns Readmission within the last 30 days:  No Current discharge risk:  None Barriers to Discharge:  No Barriers Identified   Luretha Rued, [place] 08/21/2016, 10:42 AM

## 2016-08-21 NOTE — Progress Notes (Signed)
Patient has small, red, blanchable area to sacrum measuring approximately 2cm in an irregular circular shape. Nurse applied alleyvn sacral foam dressing to sacrum to prevent skin breakdown.

## 2016-08-21 NOTE — Progress Notes (Signed)
   Subjective:  Patient reports pain as mild.  No c/o.  Objective:   VITALS:   Vitals:   08/21/16 0100 08/21/16 0558 08/21/16 0950 08/21/16 1306  BP: (!) 146/60 (!) 95/57 111/60 (!) 96/46  Pulse: 91 97 91 86  Resp: 16 16 16 16   Temp: 98 F (36.7 C) 98.4 F (36.9 C) 98.8 F (37.1 C) 98.2 F (36.8 C)  TempSrc: Oral Oral Oral Oral  SpO2: 91% 90% 97% 100%  Weight:      Height:        NAD ABD soft Sensation intact distally Intact pulses distally Dorsiflexion/Plantar flexion intact Incision: dressing C/D/I Compartment soft   Lab Results  Component Value Date   WBC 10.2 08/21/2016   HGB 10.3 (L) 08/21/2016   HCT 32.3 (L) 08/21/2016   MCV 92.6 08/21/2016   PLT 219 08/21/2016   BMET    Component Value Date/Time   NA 139 08/21/2016 0900   NA 142 05/01/2016   K 3.1 (L) 08/21/2016 0900   CL 102 08/21/2016 0900   CO2 28 08/21/2016 0900   GLUCOSE 124 (H) 08/21/2016 0900   BUN 15 08/21/2016 0900   BUN 21 05/01/2016   CREATININE 0.75 08/21/2016 0900   CALCIUM 7.7 (L) 08/21/2016 0900   GFRNONAA >60 08/21/2016 0900   GFRAA >60 08/21/2016 0900     Assessment/Plan: 1 Day Post-Op   Principal Problem:   Closed right hip fracture, initial encounter (HCC) Active Problems:   Rheumatoid arthritis (HCC)   Glaucoma   Leukocytosis   Hyperglycemia   Dementia   Osteoporosis   HLD (hyperlipidemia)   Elevated BP without diagnosis of hypertension   Displaced intertrochanteric fracture of right femur, initial encounter for closed fracture (HCC)   WBAT with walker PO pain control PT/OT DVT ppx: lovenox, SCDs, TEDs Dispo: SNF placement   Jenniferann Stuckert, 08/23/2016 08/21/2016, 6:16 PM   08/23/2016, MD Cell [phone]

## 2016-08-21 NOTE — Progress Notes (Signed)
CSW spoke with pt's son this afternoon. Son feels rehab placement is needed and gave permission for CSW to initiate SNF search. Bed offers are pending. CSW will continue to follow to assist with d/c planning needs.  Cori Razor LCSW [phone]

## 2016-08-21 NOTE — Evaluation (Signed)
Occupational Therapy Evaluation Patient Details Name: [name] [name] MRN: [id number] DOB: 01-06-1923 Today's Date: 08/21/2016    History of Present Illness  LAQUENTA WHITSELL is a 83 y/o female with HLD, mild dementia (still living independent and very active as per family), RA, Glaucoma, osteoporosis and priro left hip fracture (status post replacement); who presented to ED with complaints of right hip pain after unwitnessed mechanical fall.  Pt had R IM nail on R hip Pt is now WBAT.     Clinical Impression  Pt admitted with R hip fx. Pt currently with functional limitations due to the deficits listed below (see OT Problem List).  Pt will benefit from skilled OT to increase their safety and independence with ADL and functional mobility for ADL to facilitate discharge to venue listed below.      Follow Up Recommendations  SNF    Equipment Recommendations  None recommended by OT    Recommendations for Other Services       Precautions / Restrictions Precautions Precautions: None      Mobility Bed Mobility Overal bed mobility: Needs Assistance Bed Mobility: Supine to Sit     Supine to sit: +2 for physical assistance;Max assist        Transfers Overall transfer level: Needs assistance   Transfers: Sit to/from Stand;Stand Pivot Transfers Sit to Stand: Max assist;+2 physical assistance Stand pivot transfers: Total assist;+2 physical assistance                ADL either performed or assessed with clinical judgement   ADL Overall ADL's : Needs assistance/impaired Eating/Feeding: Set up;Sitting   Grooming: Set up;Sitting                   Toilet Transfer: Total assistance;Squat-pivot;Cueing for sequencing;Cueing for safety;+2 for physical assistance;Maximal assistance Toilet Transfer Details (indicate cue type and reason): Total A transfer from bed to chair.  Pt max A of 2 with sit to stand but not able to take step Toileting- Clothing Manipulation and Hygiene:  +2 for physical assistance;Total assistance;Sit to/from stand         General ADL Comments: pt a little confused but followed directions                    Pertinent Vitals/Pain Pain Assessment: 0-10 Pain Score: 6  Pain Location: R hip Pain Descriptors / Indicators: Aching;Grimacing;Discomfort;Sore Pain Intervention(s): Monitored during session;Limited activity within patient's tolerance;Repositioned;Patient requesting pain meds-RN notified;RN gave pain meds during session;Relaxation        Extremity/Trunk Assessment Upper Extremity Assessment Upper Extremity Assessment: Generalized weakness           Communication     Cognition Arousal/Alertness: Awake/alert Behavior During Therapy: WFL for tasks assessed/performed                                                  Home Living Family/patient expects to be discharged to:: Other (Comment) (pt will need ST SNF)                                                 OT Problem List: Decreased strength;Decreased activity tolerance;Impaired balance (sitting and/or standing);Decreased knowledge of use of DME or AE;Pain  OT Treatment/Interventions: Self-care/ADL training;DME and/or AE instruction;Patient/family education;Therapeutic activities    OT Goals(Current goals can be found in the care plan section) Acute Rehab OT Goals Patient Stated Goal: get well- i need to be able to go home OT Goal Formulation: With patient Time For Goal Achievement: 09/04/16  OT Frequency: Min 2X/week   Barriers to D/C:               End of Session Equipment Utilized During Treatment: Engineer, water Communication: Mobility status;Patient requests pain meds  Activity Tolerance:   Patient left: in chair;with call bell/phone within reach  OT Visit Diagnosis: Unsteadiness on feet (R26.81);Pain;Muscle weakness (generalized) (M62.81);Repeated falls (R29.6);Other abnormalities of gait and  mobility (R26.89)                Time: 3419-3790 OT Time Calculation (min): 40 min Charges:  OT General Charges $OT Visit: 1 Procedure OT Evaluation $OT Eval Moderate Complexity: 1 Procedure OT Treatments $Self Care/Home Management : 8-22 mins G-Codes:     Lise Auer, OT [phone]  Einar Crow D 08/21/2016, 11:46 AM

## 2016-08-21 NOTE — Progress Notes (Addendum)
PROGRESS NOTE  [name] [name] ION:[id number] DOB: [birth year] DOA: 08/20/2016 PCP: [name] Penna, MD  Brief History:  83 y/o female with HLD, mild dementia (still living independent and very active as per family), RA, Glaucoma, osteoporosis and priro left hip fracture (status post replacement); who presented to ED with complaints of right hip pain after unwitnessed mechanical fall. Patient unable to bear weight on her right leg due to pain. No prodromic symptoms leading to fall.  X-rays in the emergency department revealed a comminuted proximal right femur intertrochanteric fracture. Orthopedics was consulted to assist with management.  Assessment/Plan: Closed right intertrochanteric femur fracture -Status post IM fixation, Dr. Linna Caprice -PT-->SNF -DVT prophylaxis per Ortho -pain controlled  RA -holding methotrexate while inpatient  Leukocytosis -stress demargination -afebrile and hemodynamically stable  Hyperglycemia -HbA1C pending  Hyperlipidemia -continue statin  Osteoporosis -on prolia as outpt  Hypokalemia -replete -check mag    Disposition Plan:   SNF 4/26 if stable  Family Communication:  No Family at bedside  Consultants:  Ortho--[place]  Code Status:  FULL   DVT Prophylaxis:   [place] Lovenox   Procedures: As Listed in Progress Note Above  Antibiotics: None    Subjective: Patient denies fevers, chills, headache, chest pain, dyspnea, nausea, vomiting, diarrhea, abdominal pain, dysuria, hematuria, hematochezia, and melena.   Objective: Vitals:   08/21/16 0100 08/21/16 0558 08/21/16 0950 08/21/16 1306  BP: (!) 146/60 (!) 95/57 111/60 (!) 96/46  Pulse: 91 97 91 86  Resp: 16 16 16 16   Temp: 98 F (36.7 C) 98.4 F (36.9 C) 98.8 F (37.1 C) 98.2 F (36.8 C)  TempSrc: Oral Oral Oral Oral  SpO2: 91% 90% 97% 100%  Weight:      Height:        Intake/Output Summary (Last 24 hours) at 08/21/16 1824 Last data filed at 08/21/16 1430  Gross  per 24 hour  Intake             1400 ml  Output             1000 ml  Net              400 ml   Weight change:  Exam:   General:  Pt is alert, follows commands appropriately, not in acute distress  HEENT: No icterus, No thrush, No neck mass, [place]/AT  Cardiovascular: RRR, S1/S2, no rubs, no gallops  Respiratory: CTA bilaterally, no wheezing, no crackles, no rhonchi  Abdomen: Soft/+BS, non tender, non distended, no guarding  Extremities: No edema, No lymphangitis, No petechiae, No rashes, no synovitis   Data Reviewed: I have personally reviewed following labs and imaging studies Basic Metabolic Panel:  Recent Labs Lab 08/20/16 1512 08/21/16 0900  NA 141 139  K 3.8 3.1*  CL 106 102  CO2 25 28  GLUCOSE 124* 124*  BUN 15 15  CREATININE 0.71 0.75  CALCIUM 8.7* 7.7*   Liver Function Tests:  Recent Labs Lab 08/20/16 1512  AST 31  ALT 15  ALKPHOS 64  BILITOT 1.0  PROT 7.1  ALBUMIN 3.4*   No results for input(s): LIPASE, AMYLASE in the last 168 hours. No results for input(s): AMMONIA in the last 168 hours. Coagulation Profile:  Recent Labs Lab 08/20/16 1512  INR 1.02   CBC:  Recent Labs Lab 08/20/16 1512 08/21/16 0900  WBC 14.7* 10.2  NEUTROABS 13.1*  --   HGB 13.7 10.3*  HCT 41.9 32.3*  MCV 91.5  92.6  PLT 262 219   Cardiac Enzymes:  Recent Labs Lab 08/20/16 1512  CKTOTAL 108   BNP: Invalid input(s): POCBNP CBG: No results for input(s): GLUCAP in the last 168 hours. HbA1C: No results for input(s): HGBA1C in the last 72 hours. Urine analysis:    Component Value Date/Time   COLORURINE YELLOW 08/20/2016 1820   APPEARANCEUR HAZY (A) 08/20/2016 1820   LABSPEC 1.016 08/20/2016 1820   PHURINE 5.0 08/20/2016 1820   GLUCOSEU NEGATIVE 08/20/2016 1820   HGBUR MODERATE (A) 08/20/2016 1820   BILIRUBINUR NEGATIVE 08/20/2016 1820   KETONESUR NEGATIVE 08/20/2016 1820   PROTEINUR NEGATIVE 08/20/2016 1820   NITRITE NEGATIVE 08/20/2016 1820    LEUKOCYTESUR TRACE (A) 08/20/2016 1820   Sepsis Labs: @LABRCNTIP (procalcitonin:4,lacticidven:4) )No results found for this or any previous visit (from the past 240 hour(s)).   Scheduled Meds: . aspirin EC  81 mg Oral Daily  . atorvastatin  10 mg Oral Daily  . cromolyn  1 drop Both Eyes TID  . docusate sodium  100 mg Oral BID  . enoxaparin (LOVENOX) injection  30 mg Subcutaneous Q24H  . feeding supplement (ENSURE ENLIVE)  237 mL Oral BID BM  . folic acid  1 mg Oral Daily  . senna  1 tablet Oral BID   Continuous Infusions:  Procedures/Studies: Dg Chest 1 View  Result Date: 08/20/2016 CLINICAL DATA:  RIGHT hip fracture, bilateral hip pain EXAM: CHEST 1 VIEW COMPARISON:  [id number] FINDINGS: Normal cardiac silhouette. Lungs are hyperinflated. No effusion, infiltrate or pneumothorax. IMPRESSION: Hyperinflated lungs.  No acute findings. Electronically Signed   By: Genevive Bi M.D.   On: 08/20/2016 15:48   Ct Head Wo Contrast  Result Date: 08/20/2016 CLINICAL DATA:  Recent fall EXAM: CT HEAD WITHOUT CONTRAST CT CERVICAL SPINE WITHOUT CONTRAST TECHNIQUE: Multidetector CT imaging of the head and cervical spine was performed following the standard protocol without intravenous contrast. Multiplanar CT image reconstructions of the cervical spine were also generated. COMPARISON:  04/13/2016 FINDINGS: CT HEAD FINDINGS Brain: Atrophic changes are noted. Scattered areas of decreased attenuation are noted in the deep white matter consistent with chronic white matter ischemic change. No findings to suggest acute hemorrhage, acute infarction or space-occupying mass lesion are noted. Vascular: No hyperdense vessel or unexpected calcification. Skull: Normal. Negative for fracture or focal lesion. Sinuses/Orbits: No acute finding. Other: None. CT CERVICAL SPINE FINDINGS Alignment: Alignment is within normal limits with the exception of mild degenerative anterolisthesis of C5 on C6 and C6 on C7. Skull base and  vertebrae: 7 cervical segments are well visualized. Vertebral body height is well maintained. Facet hypertrophic changes are noted at multiple levels. Disc space narrowing is noted most marked at C6-7. Mild osteophytic changes are seen. No acute fracture or acute facet abnormality is noted. Soft tissues and spinal canal: Carotid calcifications are noted. No acute soft tissue abnormality is seen. Upper chest: Scarring is noted in the apices bilaterally. IMPRESSION: CT of the head: Chronic atrophic and ischemic changes. No acute abnormality noted. CT of cervical spine: Degenerative change without acute abnormality. Electronically Signed   By: Alcide Clever M.D.   On: 08/20/2016 16:17   Ct Cervical Spine Wo Contrast  Result Date: 08/20/2016 CLINICAL DATA:  Recent fall EXAM: CT HEAD WITHOUT CONTRAST CT CERVICAL SPINE WITHOUT CONTRAST TECHNIQUE: Multidetector CT imaging of the head and cervical spine was performed following the standard protocol without intravenous contrast. Multiplanar CT image reconstructions of the cervical spine were also generated. COMPARISON:  04/13/2016 FINDINGS: CT  HEAD FINDINGS Brain: Atrophic changes are noted. Scattered areas of decreased attenuation are noted in the deep white matter consistent with chronic white matter ischemic change. No findings to suggest acute hemorrhage, acute infarction or space-occupying mass lesion are noted. Vascular: No hyperdense vessel or unexpected calcification. Skull: Normal. Negative for fracture or focal lesion. Sinuses/Orbits: No acute finding. Other: None. CT CERVICAL SPINE FINDINGS Alignment: Alignment is within normal limits with the exception of mild degenerative anterolisthesis of C5 on C6 and C6 on C7. Skull base and vertebrae: 7 cervical segments are well visualized. Vertebral body height is well maintained. Facet hypertrophic changes are noted at multiple levels. Disc space narrowing is noted most marked at C6-7. Mild osteophytic changes are  seen. No acute fracture or acute facet abnormality is noted. Soft tissues and spinal canal: Carotid calcifications are noted. No acute soft tissue abnormality is seen. Upper chest: Scarring is noted in the apices bilaterally. IMPRESSION: CT of the head: Chronic atrophic and ischemic changes. No acute abnormality noted. CT of cervical spine: Degenerative change without acute abnormality. Electronically Signed   By: Alcide Clever M.D.   On: 08/20/2016 16:17   Pelvis Portable  Result Date: 08/20/2016 CLINICAL DATA:  Status post ORIF of the right hip, postop check EXAM: PORTABLE PELVIS 1-2 VIEWS COMPARISON:  04/13/2016 FINDINGS: The patient is status post right femoral nailing. Alignment is near anatomic. Avulsed right lesser trochanter is seen. Chronic stable appearance the left hip with bipolar hip arthroplasty in place. No hardware failure or loosening is noted. IMPRESSION: New right femoral nail in near anatomic position with avulsed right lesser trochanter. No hardware failure. No postoperative fracture or dislocation. Chronic stable bipolar left hip arthroplasty. Electronically Signed   By: Tollie Eth M.D.   On: 08/20/2016 21:06   Dg Knee Complete 4 Views Right  Result Date: 08/20/2016 CLINICAL DATA:  Right knee pain, known proximal femoral fracture. EXAM: RIGHT KNEE - COMPLETE 4+ VIEW COMPARISON:  None. FINDINGS: Slightly limited lateral view of the right knee due to patient inability to position adequately due to known proximal femoral fracture. There is marked medial femorotibial osteoarthritis with bone-on-bone apposition. No fracture about the right knee is noted. No significant joint effusion is identified. Vascular calcifications are noted along the course of the femoral through tibial arteries. IMPRESSION: Osteoarthritis of the medial femorotibial compartment. No acute osseous abnormality or dislocation of the right knee. Electronically Signed   By: Tollie Eth M.D.   On: 08/20/2016 18:06   Dg  C-arm 1-60 Min-no Report  Result Date: 08/20/2016 Fluoroscopy was utilized by the requesting physician.  No radiographic interpretation.   Dg Hip Operative Unilat With Pelvis Right  Result Date: 08/20/2016 CLINICAL DATA:  Internal fixation of right femoral fracture. Initial encounter. EXAM: OPERATIVE RIGHT HIP (WITH PELVIS IF PERFORMED) 2 VIEWS TECHNIQUE: Fluoroscopic spot image(s) were submitted for interpretation post-operatively. COMPARISON:  Right hip radiographs performed earlier today at 3:28 p.m. FINDINGS: A right femoral nail and screw are noted transfixing the intertrochanteric fracture in grossly anatomic alignment. A displaced lesser trochanteric fragment is again noted. The right femoral head remains seated at the acetabulum. Mild postoperative soft tissue air is noted at the right hip. IMPRESSION: Status post internal fixation of right femoral intertrochanteric fracture in grossly anatomic alignment. Displaced lesser trochanteric fragment again noted. Electronically Signed   By: Roanna Raider M.D.   On: 08/20/2016 20:38   Dg Hip Unilat W Or Wo Pelvis 2-3 Views Right  Result Date: 08/20/2016 CLINICAL DATA:  83 year old female status post fall at 0900 hours today, found by son. Bilateral hip pain. EXAM: DG HIP (WITH OR WITHOUT PELVIS) 2-3V RIGHT COMPARISON:  Pelvis radiograph 04/13/2016 FINDINGS: Previous left hip arthroplasty. Left hip hardware appears stable and intact compared to 2017. The left femoral head component is normally located. Comminuted acute intertrochanteric fracture of the proximal right femur is relatively nondisplaced aside from mild to moderate distraction of the lesser trochanter fragment. Right femoral head remains normally located. No superimposed acute fracture of the pelvis identified. Negative visible bowel gas pattern. IMPRESSION: 1. Comminuted proximal right femur intertrochanteric fracture, relatively nondisplaced. 2. Stable and satisfactory appearance of the left  proximal femur arthroplasty. No acute pelvic fracture identified. Electronically Signed   By: Odessa Fleming M.D.   On: 08/20/2016 15:49    Allena Pietila, DO  [facility] Pager [phone]  If 7PM-7AM, please contact night-coverage [email or web address] Password [email or web address] 08/21/2016, 6:24 PM   LOS: 1 day

## 2016-08-21 NOTE — Progress Notes (Signed)
DC plan is for SNF placement. CM will follow along and assist as needed. Sandford Craze RN,BSN,NCM [phone]

## 2016-08-21 NOTE — Evaluation (Signed)
Physical Therapy Evaluation Patient Details Name: [name] [name] MRN: [id number] DOB: [birth year] Today's Date: 08/21/2016   History of Present Illness   JUDEA FENNIMORE is a 83 y/o female with HLD, mild dementia (still living independent and very active as per family), RA, Glaucoma, osteoporosis and priro left hip fracture (status post replacement); who presented to ED with complaints of right hip pain after unwitnessed mechanical fall.  Pt had R IM nail on R hip Pt is now WBAT.  Clinical Impression  Pt admitted as above and presenting with functional mobility limitations 2* generalized weakness and post op pain.  Pt would benefit from follow up rehab at SNF level to maximize IND and safety prior to return home with ltd assist.    Follow Up Recommendations SNF    Equipment Recommendations  None recommended by PT    Recommendations for Other Services OT consult     Precautions / Restrictions Precautions Precautions: Fall Restrictions Weight Bearing Restrictions: No Other Position/Activity Restrictions: WBAT      Mobility  Bed Mobility Overal bed mobility: Needs Assistance Bed Mobility: Supine to Sit     Supine to sit: +2 for physical assistance;Max assist     General bed mobility comments: Increased time, cues for use of L LE to self assist and use of pad to complete transition to EOB sitting  Transfers Overall transfer level: Needs assistance Equipment used: Rolling walker (2 wheeled) Transfers: Sit to/from [employer] Sit to Stand: Max assist;+2 physical assistance Stand pivot transfers: Total assist;+2 physical assistance          Ambulation/Gait Ambulation/Gait assistance: Mod assist;+2 physical assistance;+2 safety/equipment Ambulation Distance (Feet): 1 Feet Assistive device: Rolling walker (2 wheeled) Gait Pattern/deviations: Step-to pattern;Decreased step length - right;Decreased step length - left;Shuffle;Trunk flexed Gait velocity:  decre Gait velocity interpretation: Below normal speed for age/gender General Gait Details: cues for sequence and foot placement.  Pt able to take 1 step with each foot  Stairs            Wheelchair Mobility    Modified Rankin (Stroke Patients Only)       Balance Overall balance assessment: Needs assistance Sitting-balance support: Single extremity supported Sitting balance-Leahy Scale: Fair Sitting balance - Comments: L lean initially   Standing balance support: Bilateral upper extremity supported Standing balance-Leahy Scale: Poor                               Pertinent Vitals/Pain Pain Assessment: 0-10 Pain Score: 6  Pain Location: R hip Pain Descriptors / Indicators: Aching;Grimacing;Discomfort;Sore Pain Intervention(s): Limited activity within patient's tolerance;Monitored during session;RN gave pain meds during session    Home Living Family/patient expects to be discharged to:: Skilled nursing facility Living Arrangements: Alone                    Prior Function Level of Independence: Independent with assistive device(s)         Comments: Used cane in home, son drives her MD appointments, get groceries, etc.     Hand Dominance   Dominant Hand: Right    Extremity/Trunk Assessment   Upper Extremity Assessment Upper Extremity Assessment: Generalized weakness    Lower Extremity Assessment Lower Extremity Assessment: Generalized weakness;RLE deficits/detail RLE: Unable to fully assess due to pain    Cervical / Trunk Assessment Cervical / Trunk Assessment: Kyphotic  Communication   Communication: No difficulties  Cognition Arousal/Alertness: Awake/alert Behavior During  Therapy: WFL for tasks assessed/performed Overall Cognitive Status: History of cognitive impairments - at baseline                                 General Comments: mild dementia per chart      General Comments      Exercises      Assessment/Plan    PT Assessment Patient needs continued PT services  PT Problem List Decreased strength;Decreased range of motion;Decreased activity tolerance;Decreased balance;Decreased mobility;Decreased knowledge of use of DME;Pain       PT Treatment Interventions DME instruction;Gait training;Functional mobility training;Therapeutic activities;Therapeutic exercise;Patient/family education    PT Goals (Current goals can be found in the Care Plan section)  Acute Rehab PT Goals Patient Stated Goal: get well- i need to be able to go home PT Goal Formulation: With patient Time For Goal Achievement: 08/24/16 Potential to Achieve Goals: Good    Frequency Min 3X/week   Barriers to discharge        Co-evaluation PT/OT/SLP Co-Evaluation/Treatment: Yes Reason for Co-Treatment: For patient/therapist safety PT goals addressed during session: Mobility/safety with mobility OT goals addressed during session: ADL's and self-care       End of Session Equipment Utilized During Treatment: Gait belt Activity Tolerance: Patient limited by fatigue;Patient limited by pain Patient left: in chair;with call bell/phone within reach;with chair alarm set Nurse Communication: Mobility status PT Visit Diagnosis: Unsteadiness on feet (R26.81);History of falling (Z91.81);Difficulty in walking, not elsewhere classified (R26.2)    Time: 3354-5625 PT Time Calculation (min) (ACUTE ONLY): 39 min   Charges:   PT Evaluation $PT Eval Low Complexity: 1 Procedure PT Treatments $Therapeutic Activity: 8-22 mins   PT G Codes:        Pg [phone]   Tearra Ouk 08/21/2016, 12:47 PM

## 2016-08-21 NOTE — Progress Notes (Signed)
Initial Nutrition Assessment  DOCUMENTATION CODES:   Not applicable  INTERVENTION:  - Will order Ensure Enlive po BID, each supplement provides 350 kcal and 20 grams of protein - Continue to encourage PO intakes   NUTRITION DIAGNOSIS:   Increased nutrient needs (for protein) related to wound healing as evidenced by other (see comment) (surgical intervention for R hip fx).  GOAL:   Patient will meet greater than or equal to 90% of their needs  MONITOR:   PO intake, Supplement acceptance, Weight trends, Labs, Skin  REASON FOR ASSESSMENT:   Consult Hip fracture protocol  ASSESSMENT:   83 y/o female with HLD, mild dementia (still living independent and very active as per family), RA, Glaucoma, osteoporosis and priro left hip fracture (status post replacement); who presented to ED with complaints of right hip pain after unwitnessed mechanical fall. Patient unable to bear weight on her right leg due to pain. No prodromic symptoms leading to fall, but patient slightly confused and not clear into how she fell.  Pt seen for consult. BMI indicates normal weight/borderline underweight. Unable to see/talk with pt x2 attempted visits. Pt on CLD from 11 PM last night until advancement to Regular diet today at 10:50 AM. No documented intakes at this time.   Unable to complete physical assessment at this time but will do so at follow-up. Per chart review, pt has gained 4 lbs since 05/02/16 with 3 lbs of this being since 07/01/16. Will order Ensure Enlive BID to supplement.   Medications reviewed; 100 mg Colace BID, 1 mg folic acid/day, 1 tablet Senokot BID.  Labs reviewed; K: 3.1 mmol/L, Ca: 7.7 mg/dL.   Diet Order:  Diet regular Room service appropriate? Yes; Fluid consistency: Thin  Skin:  Wound (see comment) (R hip incision from 4/24)  Last BM:  PTA/unknown  Height:   Ht Readings from Last 1 Encounters:  08/20/16 5\' 3"  (1.6 m)    Weight:   Wt Readings from Last 1 Encounters:   08/20/16 106 lb (48.1 kg)    Ideal Body Weight:  52.27 kg  BMI:  Body mass index is 18.78 kg/m.  Estimated Nutritional Needs:   Kcal:  1345-1540 (28-32 kcal/kg)  Protein:  53-62 grams (1.1-1.3 grams/kg)  Fluid:  >/= 1.5 L/day  EDUCATION NEEDS:   No education needs identified at this time    08/22/16, MS, RD, LDN, CNSC Inpatient Clinical Dietitian Pager # [phone] After hours/weekend pager # [phone]

## 2016-08-21 NOTE — Clinical Social Work Placement (Signed)
   CLINICAL SOCIAL WORK PLACEMENT  NOTE  Date:  08/21/2016  Patient Details  Name: [name] [name] MRN: [id number] Date of Birth: [birth year]  Clinical Social Work is seeking post-discharge placement for this patient at the Skilled  Nursing Facility level of care (*CSW will initial, date and re-position this form in  chart as items are completed):      Patient/family provided with [facility] Clinical Social Work Department's list of facilities offering this level of care within the geographic area requested by the patient (or if unable, by the patient's family).  Yes   Patient/family informed of their freedom to choose among providers that offer the needed level of care, that participate in Medicare, Medicaid or managed care program needed by the patient, have an available bed and are willing to accept the patient.  Yes   Patient/family informed of [facility] ownership interest in [facility] and [facility], as well as of the fact that they are under no obligation to receive care at these facilities.  PASRR submitted to EDS on       PASRR number received on       Existing PASRR number confirmed on 08/21/16     FL2 transmitted to all facilities in geographic area requested by pt/family on 08/21/16     FL2 transmitted to all facilities within larger geographic area on       Patient informed that his/her managed care company has contracts with or will negotiate with certain facilities, including the following:            Patient/family informed of bed offers received.  Patient chooses bed at       Physician recommends and patient chooses bed at      Patient to be transferred to   on  .  Patient to be transferred to facility by       Patient family notified on   of transfer.  Name of family member notified:        PHYSICIAN       Additional Comment:    _______________________________________________ Royetta Asal, LCSW  [phone] 08/21/2016, 1:16 PM

## 2016-08-21 NOTE — [place] FL2 (Deleted)
[place] MEDICAID FL2 LEVEL OF CARE SCREENING TOOL     IDENTIFICATION  Patient Name: [name] [name] Birthdate: May 25, 1922 Sex: female Admission Date (Current Location): 08/20/2016  [facility] and [place] Number:  Producer, television/film/video and Address:  [facility],  [address] [place] [address]      Provider Number: [id number]  Attending Physician Name and Address:  Catarina Hartshorn, MD  Relative Name and Phone Number:       Current Level of Care: Hospital Recommended Level of Care: Skilled Nursing Facility Prior Approval Number:    Date Approved/Denied:   PASRR Number: [id number]  Discharge Plan: SNF    Current Diagnoses: Patient Active Problem List   Diagnosis Date Noted  . Closed right hip fracture, initial encounter (HCC) 08/20/2016  . Leukocytosis 08/20/2016  . Hyperglycemia 08/20/2016  . Dementia 08/20/2016  . Osteoporosis 08/20/2016  . HLD (hyperlipidemia) 08/20/2016  . Elevated BP without diagnosis of hypertension 08/20/2016  . Displaced intertrochanteric fracture of right femur, initial encounter for closed fracture (HCC) 08/20/2016  . Glaucoma 07/01/2016  . Acute respiratory failure with hypoxemia (HCC) 07/01/2016  . Rheumatoid arthritis (HCC) 04/18/2016  . Subacute confusional state 04/18/2016  . Closed displaced fracture of left femoral neck (HCC) 04/13/2016  . Hip fracture requiring operative repair, left, closed, initial encounter (HCC) 04/13/2016    Orientation RESPIRATION BLADDER Height & Weight     Self, Situation, Place  Normal Continent Weight: 106 lb (48.1 kg) Height:  5\' 3"  (160 cm)  BEHAVIORAL SYMPTOMS/MOOD NEUROLOGICAL BOWEL NUTRITION STATUS  Other (Comment) (No behaviors)   Continent Diet  AMBULATORY STATUS COMMUNICATION OF NEEDS Skin   Extensive Assist Verbally Surgical wounds                       Personal Care Assistance Level of Assistance  Bathing, Feeding, Dressing Bathing Assistance: Maximum assistance Feeding  assistance: Independent Dressing Assistance: Maximum assistance     Functional Limitations Info  Sight, Hearing, Speech Sight Info: Adequate Hearing Info: Adequate Speech Info: Adequate    SPECIAL CARE FACTORS FREQUENCY  PT (By licensed PT), OT (By licensed OT)     PT Frequency: 5x wk OT Frequency: 5x wk            Contractures Contractures Info: Not present    Additional Factors Info  Code Status Code Status Info: Full Code             Current Medications (08/21/2016):  This is the current hospital active medication list Current Facility-Administered Medications  Medication Dose Route Frequency Provider Last Rate Last Dose  . acetaminophen (TYLENOL) tablet 650 mg  650 mg Oral Q6H PRN 08/23/2016, MD       Or  . acetaminophen (TYLENOL) suppository 650 mg  650 mg Rectal Q6H PRN Samson Frederic, MD      . aspirin EC tablet 81 mg  81 mg Oral Daily Samson Frederic, MD   81 mg at 08/21/16 0946  . atorvastatin (LIPITOR) tablet 10 mg  10 mg Oral Daily 08/23/16, MD   10 mg at 08/21/16 0946  . cromolyn (OPTICROM) 4 % ophthalmic solution 1 drop  1 drop Both Eyes TID 08/23/16, MD   1 drop at 08/21/16 1100  . docusate sodium (COLACE) capsule 100 mg  100 mg Oral BID 08/23/16, MD   100 mg at 08/21/16 0946  . enoxaparin (LOVENOX) injection 30 mg  30 mg Subcutaneous Q24H 08/23/16, MD  30 mg at 08/21/16 0900  . feeding supplement (ENSURE ENLIVE) (ENSURE ENLIVE) liquid 237 mL  237 mL Oral BID BM Catarina Hartshorn, MD      . folic acid (FOLVITE) tablet 1 mg  1 mg Oral Daily Vassie Loll, MD   1 mg at 08/21/16 0946  . hydrALAZINE (APRESOLINE) injection 10 mg  10 mg Intravenous Q8H PRN Vassie Loll, MD      . HYDROcodone-acetaminophen (NORCO/VICODIN) 5-325 MG per tablet 1-2 tablet  1-2 tablet Oral Q6H PRN Samson Frederic, MD   1 tablet at 08/21/16 1101  . menthol-cetylpyridinium (CEPACOL) lozenge 3 mg  1 lozenge Oral PRN Samson Frederic, MD       Or  . phenol (CHLORASEPTIC)  mouth spray 1 spray  1 spray Mouth/Throat PRN Samson Frederic, MD      . metoCLOPramide (REGLAN) tablet 5-10 mg  5-10 mg Oral Q8H PRN Samson Frederic, MD       Or  . metoCLOPramide (REGLAN) injection 5-10 mg  5-10 mg Intravenous Q8H PRN Samson Frederic, MD      . morphine 4 MG/ML injection 0.52 mg  0.52 mg Intravenous Q2H PRN Samson Frederic, MD      . ondansetron [facility]) tablet 4 mg  4 mg Oral Q6H PRN Samson Frederic, MD       Or  . ondansetron (ZOFRAN) injection 4 mg  4 mg Intravenous Q6H PRN Samson Frederic, MD      . polyethylene glycol (MIRALAX / GLYCOLAX) packet 17 g  17 g Oral Daily PRN Samson Frederic, MD      . senna (SENOKOT) tablet 8.6 mg  1 tablet Oral BID Samson Frederic, MD   8.6 mg at 08/21/16 6213     Discharge Medications: Please see discharge summary for a list of discharge medications.  Relevant Imaging Results:  Relevant Lab Results:   Additional Information SSN [id number]  Brydan Downard, [name], LCSW

## 2016-08-22 LAB — BASIC METABOLIC PANEL
ANION GAP: 5 (ref 5–15)
BUN: 19 mg/dL (ref 6–20)
CALCIUM: 8.3 mg/dL — AB (ref 8.9–10.3)
CO2: 30 mmol/L (ref 22–32)
Chloride: 106 mmol/L (ref 101–111)
Creatinine, Ser: 0.78 mg/dL (ref 0.44–1.00)
GLUCOSE: 110 mg/dL — AB (ref 65–99)
POTASSIUM: 3.9 mmol/L (ref 3.5–5.1)
Sodium: 141 mmol/L (ref 135–145)

## 2016-08-22 LAB — HEMOGLOBIN A1C
HEMOGLOBIN A1C: 5.1 % (ref 4.8–5.6)
Mean Plasma Glucose: 100 mg/dL

## 2016-08-22 LAB — CBC
HEMATOCRIT: 30.5 % — AB (ref 36.0–46.0)
Hemoglobin: 9.5 g/dL — ABNORMAL LOW (ref 12.0–15.0)
MCH: 29.3 pg (ref 26.0–34.0)
MCHC: 31.1 g/dL (ref 30.0–36.0)
MCV: 94.1 fL (ref 78.0–100.0)
PLATELETS: 181 10*3/uL (ref 150–400)
RBC: 3.24 MIL/uL — AB (ref 3.87–5.11)
RDW: 15.7 % — AB (ref 11.5–15.5)
WBC: 10.7 10*3/uL — AB (ref 4.0–10.5)

## 2016-08-22 LAB — MAGNESIUM: Magnesium: 1.8 mg/dL (ref 1.7–2.4)

## 2016-08-22 MED ORDER — TRAMADOL HCL 50 MG PO TABS: 50.0000 mg | ORAL_TABLET | Freq: Two times a day (BID) | ORAL | 0 refills | Status: DC

## 2016-08-22 MED ORDER — POLYETHYLENE GLYCOL 3350 17 G PO PACK
17.0000 g | PACK | Freq: Every day | ORAL | 0 refills | Status: AC | PRN
Start: 2016-08-22 — End: ?

## 2016-08-22 MED ORDER — ENOXAPARIN SODIUM 30 MG/0.3ML ~~LOC~~ SOLN: 30.0000 mg | SUBCUTANEOUS | 0 refills | Status: DC

## 2016-08-22 MED ORDER — SENNA 8.6 MG PO TABS: 1.0000 | ORAL_TABLET | Freq: Two times a day (BID) | ORAL | 0 refills | Status: DC

## 2016-08-22 MED ORDER — HYDROCODONE-ACETAMINOPHEN 5-325 MG PO TABS: 1.0000 | ORAL_TABLET | Freq: Four times a day (QID) | ORAL | 0 refills | Status: DC | PRN

## 2016-08-22 NOTE — Progress Notes (Signed)
Report called to [facility] left number to return call.   Sharrell Ku RN

## 2016-08-22 NOTE — Progress Notes (Signed)
   Subjective:  Patient reports pain as mild.  No c/o.  Objective:   VITALS:   Vitals:   08/22/16 0238 08/22/16 0244 08/22/16 0641 08/22/16 1315  BP: (!) 117/59  115/60 (!) 115/51  Pulse: 95  92 (!) 105  Resp: 16  15 16   Temp: 98.9 F (37.2 C)  98.7 F (37.1 C) 99 F (37.2 C)  TempSrc: Oral  Oral Oral  SpO2: (!) 89% 94% 95% 96%  Weight:      Height:        NAD ABD soft Sensation intact distally Intact pulses distally Dorsiflexion/Plantar flexion intact Incision: dressing C/D/I Compartment soft   Lab Results  Component Value Date   WBC 10.7 (H) 08/22/2016   HGB 9.5 (L) 08/22/2016   HCT 30.5 (L) 08/22/2016   MCV 94.1 08/22/2016   PLT 181 08/22/2016   BMET    Component Value Date/Time   NA 141 08/22/2016 0515   NA 142 05/01/2016   K 3.9 08/22/2016 0515   CL 106 08/22/2016 0515   CO2 30 08/22/2016 0515   GLUCOSE 110 (H) 08/22/2016 0515   BUN 19 08/22/2016 0515   BUN 21 05/01/2016   CREATININE 0.78 08/22/2016 0515   CALCIUM 8.3 (L) 08/22/2016 0515   GFRNONAA >60 08/22/2016 0515   GFRAA >60 08/22/2016 0515     Assessment/Plan: 2 Days Post-Op   Principal Problem:   Closed right hip fracture, initial encounter (HCC) Active Problems:   Rheumatoid arthritis (HCC)   Glaucoma   Leukocytosis   Hyperglycemia   Dementia   Osteoporosis   HLD (hyperlipidemia)   Elevated BP without diagnosis of hypertension   Displaced intertrochanteric fracture of right femur, initial encounter for closed fracture [facility])   Fall   Closed comminuted intertrochanteric fracture of femur with delayed healing, subsequent encounter   WBAT with walker PO pain control PT/OT DVT ppx: lovenox for 30 days, SCDs, TEDs Dispo: d/c to SNF   [name] [name], [facility] 08/22/2016, 3:14 PM   08/24/2016, MD Cell [phone]

## 2016-08-22 NOTE — Clinical Social Work Placement (Signed)
   CLINICAL SOCIAL WORK PLACEMENT  NOTE  Date:  08/22/2016  Patient Details  Name: [name] [name] MRN: [id number] Date of Birth: 06/04/1922  Clinical Social Work is seeking post-discharge placement for this patient at the Skilled  Nursing Facility level of care (*CSW will initial, date and re-position this form in  chart as items are completed):      Patient/family provided with [facility] Clinical Social Work Department's list of facilities offering this level of care within the geographic area requested by the patient (or if unable, by the patient's family).  Yes   Patient/family informed of their freedom to choose among providers that offer the needed level of care, that participate in Medicare, Medicaid or managed care program needed by the patient, have an available bed and are willing to accept the patient.  Yes   Patient/family informed of [facility]'s ownership interest in [facility] and [facility], as well as of the fact that they are under no obligation to receive care at these facilities.  PASRR submitted to EDS on       PASRR number received on       Existing PASRR number confirmed on 08/21/16     FL2 transmitted to all facilities in geographic area requested by pt/family on 08/21/16     FL2 transmitted to all facilities within larger geographic area on       Patient informed that his/her managed care company has contracts with or will negotiate with certain facilities, including the following:        Yes   Patient/family informed of bed offers received.  Patient chooses bed at [facility]     Physician recommends and patient chooses bed at      Patient to be transferred to [facility] on 08/22/16.  Patient to be transferred to facility by [facility]     Patient family notified on 08/22/16 of transfer.  Name of family member notified:  SON     PHYSICIAN       Additional Comment: Pt / son are in agreement with d/c to  [facility] today. [facility] transport is needed. Medical necessity form completed. D/C Summary sent to SNF for review. Scripts included in d/c packet. # for report provided to nsg.   _______________________________________________ Royetta Asal, LCSW  [phone] 08/22/2016, 1:16 PM

## 2016-08-22 NOTE — [place] FL2 (Signed)
[place] MEDICAID FL2 LEVEL OF CARE SCREENING TOOL     IDENTIFICATION  Patient Name: [name] [name] Birthdate: 11-01-1922 Sex: female Admission Date (Current Location): 08/20/2016  [facility] and [place] Number:  Producer, television/film/video and Address:  [facility],  [address] [place] [address]      Provider Number: [id number]  Attending Physician Name and Address:  Catarina Hartshorn, MD  Relative Name and Phone Number:       Current Level of Care: Hospital Recommended Level of Care: Skilled Nursing Facility Prior Approval Number:    Date Approved/Denied:   PASRR Number: [id number]  Discharge Plan: SNF    Current Diagnoses: Patient Active Problem List   Diagnosis Date Noted  . Closed comminuted intertrochanteric fracture of femur with delayed healing, subsequent encounter 08/21/2016  . Fall   . Closed right hip fracture, initial encounter (HCC) 08/20/2016  . Leukocytosis 08/20/2016  . Hyperglycemia 08/20/2016  . Dementia 08/20/2016  . Osteoporosis 08/20/2016  . HLD (hyperlipidemia) 08/20/2016  . Elevated BP without diagnosis of hypertension 08/20/2016  . Displaced intertrochanteric fracture of right femur, initial encounter for closed fracture (HCC) 08/20/2016  . Glaucoma 07/01/2016  . Acute respiratory failure with hypoxemia (HCC) 07/01/2016  . Rheumatoid arthritis (HCC) 04/18/2016  . Subacute confusional state 04/18/2016  . Closed displaced fracture of left femoral neck (HCC) 04/13/2016  . Hip fracture requiring operative repair, left, closed, initial encounter (HCC) 04/13/2016    Orientation RESPIRATION BLADDER Height & Weight     Self, Situation, Place  Normal Continent Weight: 106 lb (48.1 kg) Height:  5\' 3"  (160 cm)  BEHAVIORAL SYMPTOMS/MOOD NEUROLOGICAL BOWEL NUTRITION STATUS  Other (Comment) (No behaviors)   Continent Diet  AMBULATORY STATUS COMMUNICATION OF NEEDS Skin   Extensive Assist Verbally Surgical wounds                        Personal Care Assistance Level of Assistance  Bathing, Feeding, Dressing Bathing Assistance: Maximum assistance Feeding assistance: Independent Dressing Assistance: Maximum assistance     Functional Limitations Info  Sight, Hearing, Speech Sight Info: Adequate Hearing Info: Adequate Speech Info: Adequate    SPECIAL CARE FACTORS FREQUENCY  PT (By licensed PT), OT (By licensed OT)     PT Frequency: 5x wk OT Frequency: 5x wk            Contractures Contractures Info: Not present    Additional Factors Info  Code Status Code Status Info: Full Code             Current Medications (08/22/2016):  This is the current hospital active medication list Current Facility-Administered Medications  Medication Dose Route Frequency Provider Last Rate Last Dose  . acetaminophen (TYLENOL) tablet 650 mg  650 mg Oral Q6H PRN 08/24/2016, MD   650 mg at 08/22/16 0253   Or  . acetaminophen (TYLENOL) suppository 650 mg  650 mg Rectal Q6H PRN 08/24/16, MD      . aspirin EC tablet 81 mg  81 mg Oral Daily Samson Frederic, MD   81 mg at 08/22/16 1118  . atorvastatin (LIPITOR) tablet 10 mg  10 mg Oral Daily 08/24/16, MD   10 mg at 08/22/16 1118  . cromolyn (OPTICROM) 4 % ophthalmic solution 1 drop  1 drop Both Eyes TID 08/24/16, MD   1 drop at 08/22/16 1118  . docusate sodium (COLACE) capsule 100 mg  100 mg Oral BID 08/24/16, MD  100 mg at 08/22/16 1118  . enoxaparin (LOVENOX) injection 30 mg  30 mg Subcutaneous Q24H Samson Frederic, MD   30 mg at 08/22/16 0759  . feeding supplement (ENSURE ENLIVE) (ENSURE ENLIVE) liquid 237 mL  237 mL Oral BID BM Catarina Hartshorn, MD   237 mL at 08/22/16 1118  . folic acid (FOLVITE) tablet 1 mg  1 mg Oral Daily Vassie Loll, MD   1 mg at 08/22/16 1118  . hydrALAZINE (APRESOLINE) injection 10 mg  10 mg Intravenous Q8H PRN Vassie Loll, MD      . HYDROcodone-acetaminophen (NORCO/VICODIN) 5-325 MG per tablet 1-2 tablet  1-2 tablet Oral Q6H PRN  Samson Frederic, MD   1 tablet at 08/21/16 1101  . menthol-cetylpyridinium (CEPACOL) lozenge 3 mg  1 lozenge Oral PRN Samson Frederic, MD       Or  . phenol (CHLORASEPTIC) mouth spray 1 spray  1 spray Mouth/Throat PRN Samson Frederic, MD      . metoCLOPramide (REGLAN) tablet 5-10 mg  5-10 mg Oral Q8H PRN Samson Frederic, MD       Or  . metoCLOPramide (REGLAN) injection 5-10 mg  5-10 mg Intravenous Q8H PRN Samson Frederic, MD      . morphine 4 MG/ML injection 0.52 mg  0.52 mg Intravenous Q2H PRN Samson Frederic, MD      . ondansetron [facility]) tablet 4 mg  4 mg Oral Q6H PRN Samson Frederic, MD       Or  . ondansetron (ZOFRAN) injection 4 mg  4 mg Intravenous Q6H PRN Samson Frederic, MD      . polyethylene glycol (MIRALAX / GLYCOLAX) packet 17 g  17 g Oral Daily PRN Samson Frederic, MD      . senna (SENOKOT) tablet 8.6 mg  1 tablet Oral BID Samson Frederic, MD   8.6 mg at 08/22/16 1118     Discharge Medications: Please see discharge summary for a list of discharge medications.  Relevant Imaging Results:  Relevant Lab Results:   Additional Information SSN [id number]  [name], [name], LCSW

## 2016-08-22 NOTE — Discharge Summary (Signed)
Physician Discharge Summary  [name] [name]:[id number] DOB: 01-07-1923 DOA: 08/20/2016  PCP: Alysia Penna, MD  Admit date: 08/20/2016 Discharge date: 08/22/2016  Admitted From: Home Disposition:  SNF  Recommendations for Outpatient Follow-up:  1. Follow up with PCP in 1-2 weeks 2. Please obtain BMP/CBC in one week 3. Follow up with ortho--Dr. Linna Caprice in 2 weeks     Discharge Condition: Stable CODE STATUS:FULL Diet recommendation: Regular   Brief/Interim Summary: 83 y/o female with HLD, mild dementia (still living independent and very active as per family), RA, Glaucoma, osteoporosis and priro left hip fracture (status post replacement); who presented to ED with complaints of right hip pain after unwitnessed mechanical fall. Patient unable to bear weight on her right leg due to pain. No prodromic symptoms leading to fall.  X-rays in the emergency department revealed a comminuted proximal right femur intertrochanteric fracture. Orthopedics was consulted to assist with management.  Discharge Diagnoses:  Closed right intertrochanteric femur fracture -Status post IM fixation, Dr. Linna Caprice -PT-->SNF -DVT prophylaxis--enoxaparin x 28 days -pain controlled  RA -holding methotrexate while inpatient-->restart after discharge  Leukocytosis -stress demargination -afebrile and hemodynamically stable  Hyperglycemia -08/20/16 HbA1C--5.1 -stress induced  Hyperlipidemia -continue statin  Osteoporosis -on prolia as outpt  Hypokalemia -repleted -check mag--1.8   Discharge Instructions  Discharge Instructions    Diet general    Complete by:  As directed    Increase activity slowly    Complete by:  As directed      Allergies as of 08/22/2016   No Known Allergies     Medication List    TAKE these medications   aspirin EC 81 MG tablet Take one tablet by mouth once daily; Take 1 tablet (81 mg) by mouth once daily as needed for pain. Do not crush     atorvastatin 10 MG tablet Commonly known as:  LIPITOR Take 10 mg by mouth daily.   cromolyn 4 % ophthalmic solution Commonly known as:  OPTICROM Place 1 drop into both eyes 3 (three) times daily.   denosumab 60 MG/ML Soln injection Commonly known as:  PROLIA Inject 60 mg into the skin every 6 (six) months. Administer in upper arm, thigh, or abdomen   enoxaparin 30 MG/0.3ML injection Commonly known as:  LOVENOX Inject 0.3 mLs (30 mg total) into the skin daily. Start taking on:  08/23/2016   folic acid 1 MG tablet Commonly known as:  FOLVITE Take 1 mg by mouth daily.   HYDROcodone-acetaminophen 5-325 MG tablet Commonly known as:  NORCO/VICODIN Take 1-2 tablets by mouth every 6 (six) hours as needed for moderate pain.   methotrexate 2.5 MG tablet Commonly known as:  RHEUMATREX Take 20 mg by mouth every Monday. Caution:Chemotherapy. Protect from light.   NUTRA SHAKE PO Take by mouth 2 (two) times daily.   polyethylene glycol packet Commonly known as:  MIRALAX / GLYCOLAX Take 17 g by mouth daily as needed for mild constipation.   senna 8.6 MG Tabs tablet Commonly known as:  SENOKOT Take 1 tablet (8.6 mg total) by mouth 2 (two) times daily.   traMADol 50 MG tablet Commonly known as:  ULTRAM Take 1 tablet (50 mg total) by mouth 2 (two) times daily. For arthritis pain      Follow-up Information    Swinteck, Cloyde Reams, MD. Schedule an appointment as soon as possible for a visit in 2 weeks.   Specialty:  Orthopedic Surgery Why:  For wound re-check Contact information: [address] [place]  [address] [phone]          No Known Allergies  Consultations:  Ortho--Dr. Linna Caprice   Procedures/Studies: Dg Chest 1 View  Result Date: 08/20/2016 CLINICAL DATA:  RIGHT hip fracture, bilateral hip pain EXAM: CHEST 1 VIEW COMPARISON:  [id number] FINDINGS: Normal cardiac silhouette. Lungs are hyperinflated. No effusion, infiltrate or pneumothorax.  IMPRESSION: Hyperinflated lungs.  No acute findings. Electronically Signed   By: Genevive Bi M.D.   On: 08/20/2016 15:48   Ct Head Wo Contrast  Result Date: 08/20/2016 CLINICAL DATA:  Recent fall EXAM: CT HEAD WITHOUT CONTRAST CT CERVICAL SPINE WITHOUT CONTRAST TECHNIQUE: Multidetector CT imaging of the head and cervical spine was performed following the standard protocol without intravenous contrast. Multiplanar CT image reconstructions of the cervical spine were also generated. COMPARISON:  04/13/2016 FINDINGS: CT HEAD FINDINGS Brain: Atrophic changes are noted. Scattered areas of decreased attenuation are noted in the deep white matter consistent with chronic white matter ischemic change. No findings to suggest acute hemorrhage, acute infarction or space-occupying mass lesion are noted. Vascular: No hyperdense vessel or unexpected calcification. Skull: Normal. Negative for fracture or focal lesion. Sinuses/Orbits: No acute finding. Other: None. CT CERVICAL SPINE FINDINGS Alignment: Alignment is within normal limits with the exception of mild degenerative anterolisthesis of C5 on C6 and C6 on C7. Skull base and vertebrae: 7 cervical segments are well visualized. Vertebral body height is well maintained. Facet hypertrophic changes are noted at multiple levels. Disc space narrowing is noted most marked at C6-7. Mild osteophytic changes are seen. No acute fracture or acute facet abnormality is noted. Soft tissues and spinal canal: Carotid calcifications are noted. No acute soft tissue abnormality is seen. Upper chest: Scarring is noted in the apices bilaterally. IMPRESSION: CT of the head: Chronic atrophic and ischemic changes. No acute abnormality noted. CT of cervical spine: Degenerative change without acute abnormality. Electronically Signed   By: Alcide Clever M.D.   On: 08/20/2016 16:17   Ct Cervical Spine Wo Contrast  Result Date: 08/20/2016 CLINICAL DATA:  Recent fall EXAM: CT HEAD WITHOUT CONTRAST  CT CERVICAL SPINE WITHOUT CONTRAST TECHNIQUE: Multidetector CT imaging of the head and cervical spine was performed following the standard protocol without intravenous contrast. Multiplanar CT image reconstructions of the cervical spine were also generated. COMPARISON:  04/13/2016 FINDINGS: CT HEAD FINDINGS Brain: Atrophic changes are noted. Scattered areas of decreased attenuation are noted in the deep white matter consistent with chronic white matter ischemic change. No findings to suggest acute hemorrhage, acute infarction or space-occupying mass lesion are noted. Vascular: No hyperdense vessel or unexpected calcification. Skull: Normal. Negative for fracture or focal lesion. Sinuses/Orbits: No acute finding. Other: None. CT CERVICAL SPINE FINDINGS Alignment: Alignment is within normal limits with the exception of mild degenerative anterolisthesis of C5 on C6 and C6 on C7. Skull base and vertebrae: 7 cervical segments are well visualized. Vertebral body height is well maintained. Facet hypertrophic changes are noted at multiple levels. Disc space narrowing is noted most marked at C6-7. Mild osteophytic changes are seen. No acute fracture or acute facet abnormality is noted. Soft tissues and spinal canal: Carotid calcifications are noted. No acute soft tissue abnormality is seen. Upper chest: Scarring is noted in the apices bilaterally. IMPRESSION: CT of the head: Chronic atrophic and ischemic changes. No acute abnormality noted. CT of cervical spine: Degenerative change without acute abnormality. Electronically Signed   By: Alcide Clever M.D.   On: 08/20/2016 16:17   Pelvis Portable  Result Date: 08/20/2016  CLINICAL DATA:  Status post ORIF of the right hip, postop check EXAM: PORTABLE PELVIS 1-2 VIEWS COMPARISON:  04/13/2016 FINDINGS: The patient is status post right femoral nailing. Alignment is near anatomic. Avulsed right lesser trochanter is seen. Chronic stable appearance the left hip with bipolar hip  arthroplasty in place. No hardware failure or loosening is noted. IMPRESSION: New right femoral nail in near anatomic position with avulsed right lesser trochanter. No hardware failure. No postoperative fracture or dislocation. Chronic stable bipolar left hip arthroplasty. Electronically Signed   By: Tollie Eth M.D.   On: 08/20/2016 21:06   Dg Knee Complete 4 Views Right  Result Date: 08/20/2016 CLINICAL DATA:  Right knee pain, known proximal femoral fracture. EXAM: RIGHT KNEE - COMPLETE 4+ VIEW COMPARISON:  None. FINDINGS: Slightly limited lateral view of the right knee due to patient inability to position adequately due to known proximal femoral fracture. There is marked medial femorotibial osteoarthritis with bone-on-bone apposition. No fracture about the right knee is noted. No significant joint effusion is identified. Vascular calcifications are noted along the course of the femoral through tibial arteries. IMPRESSION: Osteoarthritis of the medial femorotibial compartment. No acute osseous abnormality or dislocation of the right knee. Electronically Signed   By: Tollie Eth M.D.   On: 08/20/2016 18:06   Dg C-arm 1-60 Min-no Report  Result Date: 08/20/2016 Fluoroscopy was utilized by the requesting physician.  No radiographic interpretation.   Dg Hip Operative Unilat With Pelvis Right  Result Date: 08/20/2016 CLINICAL DATA:  Internal fixation of right femoral fracture. Initial encounter. EXAM: OPERATIVE RIGHT HIP (WITH PELVIS IF PERFORMED) 2 VIEWS TECHNIQUE: Fluoroscopic spot image(s) were submitted for interpretation post-operatively. COMPARISON:  Right hip radiographs performed earlier today at 3:28 p.m. FINDINGS: A right femoral nail and screw are noted transfixing the intertrochanteric fracture in grossly anatomic alignment. A displaced lesser trochanteric fragment is again noted. The right femoral head remains seated at the acetabulum. Mild postoperative soft tissue air is noted at the right  hip. IMPRESSION: Status post internal fixation of right femoral intertrochanteric fracture in grossly anatomic alignment. Displaced lesser trochanteric fragment again noted. Electronically Signed   By: Roanna Raider M.D.   On: 08/20/2016 20:38   Dg Hip Unilat W Or Wo Pelvis 2-3 Views Right  Result Date: 08/20/2016 CLINICAL DATA:  83 year old female status post fall at 0900 hours today, found by son. Bilateral hip pain. EXAM: DG HIP (WITH OR WITHOUT PELVIS) 2-3V RIGHT COMPARISON:  Pelvis radiograph 04/13/2016 FINDINGS: Previous left hip arthroplasty. Left hip hardware appears stable and intact compared to 2017. The left femoral head component is normally located. Comminuted acute intertrochanteric fracture of the proximal right femur is relatively nondisplaced aside from mild to moderate distraction of the lesser trochanter fragment. Right femoral head remains normally located. No superimposed acute fracture of the pelvis identified. Negative visible bowel gas pattern. IMPRESSION: 1. Comminuted proximal right femur intertrochanteric fracture, relatively nondisplaced. 2. Stable and satisfactory appearance of the left proximal femur arthroplasty. No acute pelvic fracture identified. Electronically Signed   By: Odessa Fleming M.D.   On: 08/20/2016 15:49        Discharge Exam: Vitals:   08/22/16 0238 08/22/16 0641  BP: (!) 117/59 115/60  Pulse: 95 92  Resp: 16 15  Temp: 98.9 F (37.2 C) 98.7 F (37.1 C)   Vitals:   08/21/16 2029 08/22/16 0238 08/22/16 0244 08/22/16 0641  BP: 114/70 (!) 117/59  115/60  Pulse: 91 95  92  Resp: 16 16  15  Temp: 100.3 F (37.9 C) 98.9 F (37.2 C)  98.7 F (37.1 C)  TempSrc: Oral Oral  Oral  SpO2: 93% (!) 89% 94% 95%  Weight:      Height:        General: Pt is alert, awake, not in acute distress Cardiovascular: RRR, S1/S2 +, no rubs, no gallops Respiratory: CTA bilaterally, no wheezing, no rhonchi Abdominal: Soft, NT, ND, bowel sounds + Extremities: no edema,  no cyanosis   The results of significant diagnostics from this hospitalization (including imaging, microbiology, ancillary and laboratory) are listed below for reference.    Significant Diagnostic Studies: Dg Chest 1 View  Result Date: 08/20/2016 CLINICAL DATA:  RIGHT hip fracture, bilateral hip pain EXAM: CHEST 1 VIEW COMPARISON:  [id number] FINDINGS: Normal cardiac silhouette. Lungs are hyperinflated. No effusion, infiltrate or pneumothorax. IMPRESSION: Hyperinflated lungs.  No acute findings. Electronically Signed   By: Genevive Bi M.D.   On: 08/20/2016 15:48   Ct Head Wo Contrast  Result Date: 08/20/2016 CLINICAL DATA:  Recent fall EXAM: CT HEAD WITHOUT CONTRAST CT CERVICAL SPINE WITHOUT CONTRAST TECHNIQUE: Multidetector CT imaging of the head and cervical spine was performed following the standard protocol without intravenous contrast. Multiplanar CT image reconstructions of the cervical spine were also generated. COMPARISON:  04/13/2016 FINDINGS: CT HEAD FINDINGS Brain: Atrophic changes are noted. Scattered areas of decreased attenuation are noted in the deep white matter consistent with chronic white matter ischemic change. No findings to suggest acute hemorrhage, acute infarction or space-occupying mass lesion are noted. Vascular: No hyperdense vessel or unexpected calcification. Skull: Normal. Negative for fracture or focal lesion. Sinuses/Orbits: No acute finding. Other: None. CT CERVICAL SPINE FINDINGS Alignment: Alignment is within normal limits with the exception of mild degenerative anterolisthesis of C5 on C6 and C6 on C7. Skull base and vertebrae: 7 cervical segments are well visualized. Vertebral body height is well maintained. Facet hypertrophic changes are noted at multiple levels. Disc space narrowing is noted most marked at C6-7. Mild osteophytic changes are seen. No acute fracture or acute facet abnormality is noted. Soft tissues and spinal canal: Carotid calcifications are noted.  No acute soft tissue abnormality is seen. Upper chest: Scarring is noted in the apices bilaterally. IMPRESSION: CT of the head: Chronic atrophic and ischemic changes. No acute abnormality noted. CT of cervical spine: Degenerative change without acute abnormality. Electronically Signed   By: Alcide Clever M.D.   On: 08/20/2016 16:17   Ct Cervical Spine Wo Contrast  Result Date: 08/20/2016 CLINICAL DATA:  Recent fall EXAM: CT HEAD WITHOUT CONTRAST CT CERVICAL SPINE WITHOUT CONTRAST TECHNIQUE: Multidetector CT imaging of the head and cervical spine was performed following the standard protocol without intravenous contrast. Multiplanar CT image reconstructions of the cervical spine were also generated. COMPARISON:  04/13/2016 FINDINGS: CT HEAD FINDINGS Brain: Atrophic changes are noted. Scattered areas of decreased attenuation are noted in the deep white matter consistent with chronic white matter ischemic change. No findings to suggest acute hemorrhage, acute infarction or space-occupying mass lesion are noted. Vascular: No hyperdense vessel or unexpected calcification. Skull: Normal. Negative for fracture or focal lesion. Sinuses/Orbits: No acute finding. Other: None. CT CERVICAL SPINE FINDINGS Alignment: Alignment is within normal limits with the exception of mild degenerative anterolisthesis of C5 on C6 and C6 on C7. Skull base and vertebrae: 7 cervical segments are well visualized. Vertebral body height is well maintained. Facet hypertrophic changes are noted at multiple levels. Disc space narrowing is noted most marked at  C6-7. Mild osteophytic changes are seen. No acute fracture or acute facet abnormality is noted. Soft tissues and spinal canal: Carotid calcifications are noted. No acute soft tissue abnormality is seen. Upper chest: Scarring is noted in the apices bilaterally. IMPRESSION: CT of the head: Chronic atrophic and ischemic changes. No acute abnormality noted. CT of cervical spine: Degenerative  change without acute abnormality. Electronically Signed   By: Alcide Clever M.D.   On: 08/20/2016 16:17   Pelvis Portable  Result Date: 08/20/2016 CLINICAL DATA:  Status post ORIF of the right hip, postop check EXAM: PORTABLE PELVIS 1-2 VIEWS COMPARISON:  04/13/2016 FINDINGS: The patient is status post right femoral nailing. Alignment is near anatomic. Avulsed right lesser trochanter is seen. Chronic stable appearance the left hip with bipolar hip arthroplasty in place. No hardware failure or loosening is noted. IMPRESSION: New right femoral nail in near anatomic position with avulsed right lesser trochanter. No hardware failure. No postoperative fracture or dislocation. Chronic stable bipolar left hip arthroplasty. Electronically Signed   By: Tollie Eth M.D.   On: 08/20/2016 21:06   Dg Knee Complete 4 Views Right  Result Date: 08/20/2016 CLINICAL DATA:  Right knee pain, known proximal femoral fracture. EXAM: RIGHT KNEE - COMPLETE 4+ VIEW COMPARISON:  None. FINDINGS: Slightly limited lateral view of the right knee due to patient inability to position adequately due to known proximal femoral fracture. There is marked medial femorotibial osteoarthritis with bone-on-bone apposition. No fracture about the right knee is noted. No significant joint effusion is identified. Vascular calcifications are noted along the course of the femoral through tibial arteries. IMPRESSION: Osteoarthritis of the medial femorotibial compartment. No acute osseous abnormality or dislocation of the right knee. Electronically Signed   By: Tollie Eth M.D.   On: 08/20/2016 18:06   Dg C-arm 1-60 Min-no Report  Result Date: 08/20/2016 Fluoroscopy was utilized by the requesting physician.  No radiographic interpretation.   Dg Hip Operative Unilat With Pelvis Right  Result Date: 08/20/2016 CLINICAL DATA:  Internal fixation of right femoral fracture. Initial encounter. EXAM: OPERATIVE RIGHT HIP (WITH PELVIS IF PERFORMED) 2 VIEWS  TECHNIQUE: Fluoroscopic spot image(s) were submitted for interpretation post-operatively. COMPARISON:  Right hip radiographs performed earlier today at 3:28 p.m. FINDINGS: A right femoral nail and screw are noted transfixing the intertrochanteric fracture in grossly anatomic alignment. A displaced lesser trochanteric fragment is again noted. The right femoral head remains seated at the acetabulum. Mild postoperative soft tissue air is noted at the right hip. IMPRESSION: Status post internal fixation of right femoral intertrochanteric fracture in grossly anatomic alignment. Displaced lesser trochanteric fragment again noted. Electronically Signed   By: Roanna Raider M.D.   On: 08/20/2016 20:38   Dg Hip Unilat W Or Wo Pelvis 2-3 Views Right  Result Date: 08/20/2016 CLINICAL DATA:  83 year old female status post fall at 0900 hours today, found by son. Bilateral hip pain. EXAM: DG HIP (WITH OR WITHOUT PELVIS) 2-3V RIGHT COMPARISON:  Pelvis radiograph 04/13/2016 FINDINGS: Previous left hip arthroplasty. Left hip hardware appears stable and intact compared to 2017. The left femoral head component is normally located. Comminuted acute intertrochanteric fracture of the proximal right femur is relatively nondisplaced aside from mild to moderate distraction of the lesser trochanter fragment. Right femoral head remains normally located. No superimposed acute fracture of the pelvis identified. Negative visible bowel gas pattern. IMPRESSION: 1. Comminuted proximal right femur intertrochanteric fracture, relatively nondisplaced. 2. Stable and satisfactory appearance of the left proximal femur arthroplasty. No acute pelvic  fracture identified. Electronically Signed   By: Odessa Fleming M.D.   On: 08/20/2016 15:49     Microbiology: No results found for this or any previous visit (from the past 240 hour(s)).   Labs: Basic Metabolic Panel:  Recent Labs Lab 08/20/16 1512 08/21/16 0900 08/22/16 0515  NA 141 139 141  K 3.8  3.1* 3.9  CL 106 102 106  CO2 25 28 30   GLUCOSE 124* 124* 110*  BUN 15 15 19   CREATININE 0.71 0.75 0.78  CALCIUM 8.7* 7.7* 8.3*  MG  --   --  1.8   Liver Function Tests:  Recent Labs Lab 08/20/16 1512  AST 31  ALT 15  ALKPHOS 64  BILITOT 1.0  PROT 7.1  ALBUMIN 3.4*   No results for input(s): LIPASE, AMYLASE in the last 168 hours. No results for input(s): AMMONIA in the last 168 hours. CBC:  Recent Labs Lab 08/20/16 1512 08/21/16 0900 08/22/16 0515  WBC 14.7* 10.2 10.7*  NEUTROABS 13.1*  --   --   HGB 13.7 10.3* 9.5*  HCT 41.9 32.3* 30.5*  MCV 91.5 92.6 94.1  PLT 262 219 181   Cardiac Enzymes:  Recent Labs Lab 08/20/16 1512  CKTOTAL 108   BNP: Invalid input(s): POCBNP CBG: No results for input(s): GLUCAP in the last 168 hours.  Time coordinating discharge:  Greater than 30 minutes  Signed:  Braylon Lemmons, DO [facility] Pager: [phone] 08/22/2016, 12:02 PM

## 2016-08-27 ENCOUNTER — Encounter: Payer: Self-pay | Admitting: Internal Medicine

## 2016-08-27 ENCOUNTER — Non-Acute Institutional Stay (SKILLED_NURSING_FACILITY): Payer: Medicare Other | Admitting: Internal Medicine

## 2016-08-27 DIAGNOSIS — S72002A Fracture of unspecified part of neck of left femur, initial encounter for closed fracture: Secondary | ICD-10-CM | POA: Diagnosis not present

## 2016-08-27 DIAGNOSIS — H1010 Acute atopic conjunctivitis, unspecified eye: Secondary | ICD-10-CM | POA: Diagnosis not present

## 2016-08-27 DIAGNOSIS — S72001G Fracture of unspecified part of neck of right femur, subsequent encounter for closed fracture with delayed healing: Secondary | ICD-10-CM

## 2016-08-27 DIAGNOSIS — J309 Allergic rhinitis, unspecified: Secondary | ICD-10-CM

## 2016-08-27 NOTE — Patient Instructions (Signed)
See assessment and plan under each diagnosis in the problem list and acutely for this visit 

## 2016-08-27 NOTE — Assessment & Plan Note (Addendum)
Continue Prolia every 6 months This represents at least a third fracture sustained in a mechanical fall Eliminate medications which will affect mental status or balance to prevent recurrent falls Neuro evaluation as outpatient of pre fall "dizziness" deferred to PCP

## 2016-08-27 NOTE — Progress Notes (Signed)
Facility Location: [facility]  Room Number: 308  Code Status: DNR  PCP: Alysia Penna, MD [address] [place] [address]  This is a nursing facility follow up for Nursing Facility admission .  Interim medical record and care since last [facility] visit was updated with review of diagnostic studies and change in clinical status since last visit were documented.  HPI: She was hospitalized 4/24-4/26/18, admitted from her home for right hip pain after unwitnessed mechanical fall. The patient stated that she been up for a period time was walking through her home when she became dizzy and fell. There was no associated vertigo. She denies any associated cardiac or neurologic symptoms/ prodrome prior to fall. The patient was unable to bear weight on the right leg because the pain.  X-rays in the ER revealed comminuted proximal right femur intertrochanteric fracture. Dr Linna Caprice completed IM fixation 08/20/16. DVT prophylaxis was to be Lovenox 28 days. Past history includes left hip arthroplasty 04/13/2016. She states that she slipped on an rain soaked patio falling on cement. There was no cardiac or neurologic prodrome prior to that event either. The patient has rheumatoid arthritis, methotrexate was held as an inpatient. She did have leukocytosis attributed to stress demargination. She also had stress-induced hyperglycemia;but A1c was 5.1%. Postoperatively she had anemia, normochromic, normocytic with hemoglobin 9.5 and hematocrit 30.5. White blood count was 10,700.  Review of systems:  Date given as April 16 or 17th, 2018. Other than incorrect date she was quite oriented with good recall of past medical history. She describes occasional slight epistaxis which is not significant. No other bleeding dyscrasias present. She has extrinsic symptoms of itchy, watery eyes which is a chronic issue. Dizziness which occurred prior to her fall is an  occasional occurence of unknown duration. She denied any associated extrinsic symptoms with the dizziness. She denies any associated ENT or eye symptoms.. She admits to some depression related to this fall and fracture.  Constitutional: No fever,significant weight change, fatigue  Eyes: No redness, discharge, pain, vision change ENT/mouth: No nasal congestion,  purulent discharge, earache,change in hearing ,sore throat  Cardiovascular: No chest pain, palpitations,paroxysmal nocturnal dyspnea, claudication, edema  Respiratory: No cough, sputum production,hemoptysis, DOE , significant snoring,apnea  Gastrointestinal: No heartburn,dysphagia,abdominal pain, nausea / vomiting,rectal bleeding, melena,change in bowels Genitourinary: No dysuria,hematuria, pyuria,  incontinence, nocturia Musculoskeletal: No joint stiffness, joint swelling, weakness,pain Dermatologic: No rash, pruritus, change in appearance of skin Neurologic: No dizziness,headache,syncope, seizures, numbness , tingling Psychiatric: No significant anxiety , insomnia, anorexia Endocrine: No change in hair/skin/ nails, excessive thirst, excessive hunger, excessive urination  Hematologic/lymphatic: No significant bruising, lymphadenopathy,abnormal bleeding Allergy/immunology: No urticaria, angioedema  Physical exam:  Pertinent or positive findings:She is thin and frail appearing. She has a small "dimple" or SQ deficit over the anterior nose. Thyroid is small. S4 is present. She has marked mixed arthritic changes in the hands, greater on the right. She has an osteoma of the left wrist. She has flexion contractures of the fingers greater on the right. She has 1+ pitting edema. Pedal pulses are decreased. Affect is flat.  General appearance:Adequately nourished; no acute distress , increased work of breathing is present.   Lymphatic: No lymphadenopathy about the head, neck, axilla . Eyes: No conjunctival inflammation or lid edema is present.  There is no scleral icterus. Ears:  External ear exam shows no significant lesions or deformities.   Nose:  External nasal examination shows no deformity or inflammation. Nasal mucosa are  pink and moist without lesions ,exudates Oral exam: lips and gums are healthy appearing.There is no oropharyngeal erythema or exudate . Neck:  No thyromegaly, masses, tenderness noted.    Heart:  Normal rate and regular rhythm without gallop, murmur, click, rub .  Lungs:Chest clear to auscultation without wheezes, rhonchi,rales , rubs. Abdomen:Bowel sounds are normal. Abdomen is soft and nontender with no organomegaly, hernias,masses. GU: deferred  Extremities:  No cyanosis, clubbing  Neurologic exam : Strength equal  in upper extremities but decreased Balance,Rhomberg,finger to nose testing could not be completed due to clinical state Deep tendon reflexes are equal in UE Skin: Warm & dry w/o tenting. No significant lesions or rash.  See summary under each active problem in the Problem List with associated updated therapeutic plan

## 2016-08-28 NOTE — Assessment & Plan Note (Signed)
Any Neuro or Cardiac evaluation deferred to PCP; history does not suggest such etiology but this was first of 2 falls resulting in hip fracture

## 2016-08-29 ENCOUNTER — Encounter: Payer: Self-pay | Admitting: *Deleted

## 2016-08-29 LAB — CBC AND DIFFERENTIAL
HEMATOCRIT: 32 % — AB (ref 36–46)
Hemoglobin: 10.6 g/dL — AB (ref 12.0–16.0)
PLATELETS: 315 10*3/uL (ref 150–399)
WBC: 7.2 10*3/mL

## 2016-08-29 LAB — BASIC METABOLIC PANEL
BUN: 16 mg/dL (ref 4–21)
Creatinine: 0.5 mg/dL (ref 0.5–1.1)
GLUCOSE: 94 mg/dL
Potassium: 3.6 mmol/L (ref 3.4–5.3)
SODIUM: 142 mmol/L (ref 137–147)

## 2016-09-02 ENCOUNTER — Non-Acute Institutional Stay (SKILLED_NURSING_FACILITY): Payer: Medicare Other | Admitting: Internal Medicine

## 2016-09-02 ENCOUNTER — Encounter: Payer: Self-pay | Admitting: Internal Medicine

## 2016-09-02 DIAGNOSIS — R58 Hemorrhage, not elsewhere classified: Secondary | ICD-10-CM | POA: Diagnosis not present

## 2016-09-02 LAB — CBC AND DIFFERENTIAL
HCT: 32 % — AB (ref 36–46)
Hemoglobin: 10.2 g/dL — AB (ref 12.0–16.0)
PLATELETS: 308 10*3/uL (ref 150–399)
WBC: 7.2 10*3/mL

## 2016-09-02 NOTE — Patient Instructions (Signed)
See assessment and plan under each diagnosis acutely for this visit  

## 2016-09-02 NOTE — Progress Notes (Signed)
  This is a nursing facility follow up for specific acute issue of progressive ecchymosis RLE.  Interim medical record and care since last [facility] visit was updated with review of diagnostic studies and change in clinical status since last visit were documented.  HPI: Staff noted progression of the ecchymosis in the right lower extremity beginning 08/31/16. Actually the patient states that the ecchymosis has been progressive since she fell; she states that initially it was limited to the upper thigh. She denies any recurrent trauma to RLE or fall since admission to the SNF. [employer] on-call nurse was contacted last night. Film of the right lower extremity was performed. There is a fracture fragment of the proximal femur but no acute changes. CBC was ordered and PT/OT interrupted. Lovenox and low-dose aspirin were discontinued. Orthopedic follow-up was requested.  The patient denies any pain in the right lower extremity this time. She states she has some back pain with change in position. She denies any significant cardio pulmonary symptoms. She states that she has had some intermittent mild shortness of breath.  Review of systems: She describes rare dysphagia in the past, this is not active. Epistaxis, hemoptysis, hematuria, melena, or rectal bleeding denied. No unexplained weight loss, significant dyspepsia, or abdominal pain.  Cardiovascular: No chest pain, palpitations,paroxysmal nocturnal dyspnea, claudication, edema  Respiratory: No cough, sputum production   Musculoskeletal: No joint stiffness, joint swelling, weakness,pain Dermatologic: No rash, pruritus Neurologic: No dizziness,headache,syncope, seizures, numbness , tingling Psychiatric: No significant anxiety , depression, insomnia, anorexia Hematologic/lymphatic: No significant lymphadenopathy   Physical exam: Blood pressure 90/53, patient is not on antihypertensives Pertinent or positive findings:Heart rhythm is  slightly irregular with occasional premature beats. Breath sounds are decreased.  She has extensive ecchymosis over the right lower extremity extending to the ankle, most notable at the left medial malleolus. The right calf is visibly larger than the left. Homans sign is negative. Pedal pulses are decreased. She has marked arthritic changes in the hands.   With passive knee flexion she has minor discomfort in the popliteal space. With range of motion of the right hip she has slight discomfort in the right medial thigh area.   General appearance:Adequately nourished; no acute distress , increased work of breathing is present.   Lymphatic: No lymphadenopathy about the head, neck, axilla . Eyes: No conjunctival inflammation or lid edema is present. There is no scleral icterus.  Oral exam: lips and gums are healthy appearing.There is no oropharyngeal erythema or exudate . Neck:  No thyromegaly, masses, tenderness noted.    Heart:  No gallop, murmur, click, rub .  Lungs: without wheezes, rhonchi,rales , rubs. Abdomen:Bowel sounds are normal. Abdomen is soft and nontender with no organomegaly, hernias,masses. GU: deferred  Extremities:  No cyanosis, clubbing,edema  Skin: Warm & dry w/o tenting. No significant rash.  See summary under each active problem in the Problem List with associated updated therapeutic plan

## 2016-09-04 LAB — CBC AND DIFFERENTIAL
HCT: 39 % (ref 36–46)
HEMOGLOBIN: 12.4 g/dL (ref 12.0–16.0)
Platelets: 354 10*3/uL (ref 150–399)
WBC: 7.6 10^3/mL

## 2016-09-05 ENCOUNTER — Encounter: Payer: Self-pay | Admitting: *Deleted

## 2016-09-28 ENCOUNTER — Encounter (HOSPITAL_COMMUNITY): Payer: Self-pay | Admitting: Orthopedic Surgery

## 2016-09-28 NOTE — Addendum Note (Signed)
Addendum  created 09/28/16 0845 by Raynaldo Falco, MD   Sign clinical note    

## 2017-04-04 ENCOUNTER — Encounter (HOSPITAL_COMMUNITY): Payer: Self-pay

## 2017-04-04 ENCOUNTER — Emergency Department (HOSPITAL_COMMUNITY): Payer: Medicare Other

## 2017-04-04 ENCOUNTER — Emergency Department (HOSPITAL_COMMUNITY)
Admission: EM | Admit: 2017-04-04 | Discharge: 2017-04-04 | Disposition: A | Discharge status: Home / Self Care | Payer: Medicare Other | Attending: Emergency Medicine | Admitting: Emergency Medicine

## 2017-04-04 ENCOUNTER — Other Ambulatory Visit: Payer: Self-pay

## 2017-04-04 DIAGNOSIS — Y92129 Unspecified place in nursing home as the place of occurrence of the external cause: Secondary | ICD-10-CM | POA: Diagnosis not present

## 2017-04-04 DIAGNOSIS — Y9301 Activity, walking, marching and hiking: Secondary | ICD-10-CM | POA: Diagnosis not present

## 2017-04-04 DIAGNOSIS — S51001A Unspecified open wound of right elbow, initial encounter: Secondary | ICD-10-CM | POA: Diagnosis not present

## 2017-04-04 DIAGNOSIS — W010XXA Fall on same level from slipping, tripping and stumbling without subsequent striking against object, initial encounter: Secondary | ICD-10-CM | POA: Diagnosis not present

## 2017-04-04 DIAGNOSIS — Y92009 Unspecified place in unspecified non-institutional (private) residence as the place of occurrence of the external cause: Secondary | ICD-10-CM

## 2017-04-04 DIAGNOSIS — Y999 Unspecified external cause status: Secondary | ICD-10-CM | POA: Diagnosis not present

## 2017-04-04 DIAGNOSIS — W19XXXA Unspecified fall, initial encounter: Secondary | ICD-10-CM

## 2017-04-04 DIAGNOSIS — S59901A Unspecified injury of right elbow, initial encounter: Secondary | ICD-10-CM | POA: Diagnosis present

## 2017-04-04 NOTE — ED Notes (Signed)
Patient a one touch see Doctor assessment

## 2017-04-04 NOTE — ED Notes (Signed)
Sign pad not working verbalized understanding of discharge instructions.  

## 2017-04-04 NOTE — ED Provider Notes (Signed)
[facility] EMERGENCY DEPARTMENT Provider Note   CSN: [id number] Arrival date & time: 04/04/17  0645     History   Chief Complaint Chief Complaint  Patient presents with  . Fall    HPI [name] [name] is a 83 y.o. female.  HPI Patient presents from her nursing facility after a fall. Patient recalls the entirety of the fall. She states that the floor was slippery, and she slipped and when doing so struck her right forearm against a wall. She sustained a wound to the proximal forearm, has had minor pain in this area since that time. And no distal loss of sensation or strength, no shoulder pain, no wrist pain, she denies head trauma, loss of consciousness, or other current complaints. No medication taken for relief of her soreness in the elbow.   Past Medical History:  Diagnosis Date  . Arthritis   . Cataract   . Glaucoma     Patient Active Problem List   Diagnosis Date Noted  . Closed comminuted intertrochanteric fracture of femur with delayed healing, subsequent encounter 08/21/2016  . Fall   . Closed right hip fracture (HCC) 08/20/2016  . Leukocytosis 08/20/2016  . Hyperglycemia 08/20/2016  . Dementia 08/20/2016  . Osteoporosis 08/20/2016  . HLD (hyperlipidemia) 08/20/2016  . Elevated BP without diagnosis of hypertension 08/20/2016  . Displaced intertrochanteric fracture of right femur, initial encounter for closed fracture (HCC) 08/20/2016  . Glaucoma 07/01/2016  . Acute respiratory failure with hypoxemia (HCC) 07/01/2016  . Rheumatoid arthritis (HCC) 04/18/2016  . Subacute confusional state 04/18/2016  . Closed displaced fracture of left femoral neck (HCC) 04/13/2016  . Hip fracture requiring operative repair, left, closed, initial encounter (HCC) 04/13/2016    Past Surgical History:  Procedure Laterality Date  . EYE SURGERY    . FEMUR IM NAIL Right 08/20/2016   Procedure: INTRAMEDULLARY (IM) NAIL FEMORAL RIGHT HIP;  Surgeon: Samson Frederic, MD;  Location: [facility] ORS;  Service: Orthopedics;  Laterality: Right;  . HIP ARTHROPLASTY Left 04/13/2016   Procedure: ARTHROPLASTY BIPOLAR HIP (HEMIARTHROPLASTY);  Surgeon: Beverely Low, MD;  Location: [facility] OR;  Service: Orthopedics;  Laterality: Left;  . right hand surgery      OB History    No data available       Home Medications    Prior to Admission medications   Medication Sig Start Date End Date Taking? Authorizing Provider  aspirin EC 81 MG tablet Take one tablet by mouth once daily; Take 1 tablet (81 mg) by mouth once daily as needed for pain. Do not crush    [provider]  atorvastatin (LIPITOR) 10 MG tablet Take 10 mg by mouth daily.    [provider]  cromolyn (OPTICROM) 4 % ophthalmic solution Place 1 drop into both eyes 3 (three) times daily. 03/19/16   [provider]  denosumab (PROLIA) 60 MG/ML SOLN injection Inject 60 mg into the skin every 6 (six) months. Administer in upper arm, thigh, or abdomen    [provider]  enoxaparin (LOVENOX) 30 MG/0.3ML injection Inject 0.3 mLs (30 mg total) into the skin daily. Patient not taking: Reported on 09/02/2016 08/23/16   Catarina Hartshorn, MD  folic acid (FOLVITE) 1 MG tablet Take 1 mg by mouth daily.  05/13/13   [provider]  HYDROcodone-acetaminophen (NORCO/VICODIN) 5-325 MG tablet 1 by mouth every 6 hours as needed 08/22/16- 08/24/16, then 1 by mouth every 8 hours as needed 08/25/16-08/27/17, then 1 by mouth every 12  hours as needed 08/28/16-08/30/16, then 1 by mouth daily 08/30/16-09/01/16, then discontinue    [provider]  methotrexate (RHEUMATREX) 2.5 MG tablet Take 20 mg by mouth every Monday. Caution:Chemotherapy. Protect from light.    [provider]  polyethylene glycol (MIRALAX / GLYCOLAX) packet Take 17 g by mouth daily as needed for mild constipation. 08/22/16   Catarina Hartshorn, MD  senna (SENOKOT) 8.6 MG TABS tablet Take 1 tablet (8.6 mg total) by mouth 2 (two) times daily.  08/22/16   Catarina Hartshorn, MD  traMADol (ULTRAM) 50 MG tablet Take 50 mg by mouth every 8 (eight) hours as needed (for breakthrough pain).    [provider]  UNABLE TO FIND Med Name: Med Pass 120 mL by mouth twice daily    [provider]    Family History Family History  Problem Relation Age of Onset  . Stroke Father   . Stroke Sister   . Anemia Sister   . Lung disease Neg Hx     Social History Social History   Tobacco Use  . Smoking status: Former Smoker    Packs/day: 0.50    Types: Cigarettes    Start date: 06/28/1940    Last attempt to quit: 04/13/2016    Years since quitting: 0.9  . Smokeless tobacco: Never Used  . Tobacco comment: Stopped for a total of 2 years  Substance Use Topics  . Alcohol use: No  . Drug use: No     Allergies   Patient has no known allergies.   Review of Systems Review of Systems  Constitutional: Negative for fever.  Respiratory: Negative for shortness of breath.   Cardiovascular: Negative for chest pain.  Musculoskeletal:       Negative aside from HPI  Skin:       Negative aside from HPI  Allergic/Immunologic: Negative for immunocompromised state.  Neurological: Negative for weakness.     Physical Exam Updated Vital Signs BP (!) 141/75 (BP Location: Left Arm)   Pulse 64   Temp 98.2 F (36.8 C) (Oral)   Resp 16   SpO2 96%   Physical Exam  Constitutional: She is oriented to person, place, and time. She appears well-developed and well-nourished. No distress.  HENT:  Head: Normocephalic and atraumatic.  Eyes: Conjunctivae and EOM are normal.  Cardiovascular: Normal rate, regular rhythm and intact distal pulses.  Pulmonary/Chest: Effort normal.  Musculoskeletal: She exhibits no edema.       Right shoulder: Normal.       Right wrist: Normal.       Arms: Changes of rheumatoid arthritis in both hands  Neurological: She is alert and oriented to person, place, and time. No cranial nerve deficit.  Skin: Skin is warm  and dry.  Psychiatric: She has a normal mood and affect.  Nursing note and vitals reviewed.    ED Treatments / Results   Radiology Dg Forearm Right  Result Date: 04/04/2017 CLINICAL DATA:  Fall, left forearm pain. EXAM: RIGHT FOREARM - 2 VIEW COMPARISON:  None. FINDINGS: There is no evidence of fracture or other focal bone lesions. Soft tissues are unremarkable. IMPRESSION: No acute bony abnormality. Electronically Signed   By: Charlett Nose M.D.   On: 04/04/2017 07:54    Procedures Procedures (including critical care time)  Wound care provided in the emergency department with the assistance of nursing staff.  And subsequently I discussed the importance of changing the dressing, evaluating the wound in 2 days, return precautions and follow-up instructions.  Initial Impression / Assessment and Plan / ED Course  I have reviewed the triage vital signs and the nursing notes.  Pertinent labs & imaging results that were available during my care of the patient were reviewed by me and considered in my medical decision making (see chart for details).    Elderly female presents after minor fall with avulsion/skin tear, right elbow pain. She denies other complaints, has no evidence for new neurologic dysfunction, is awake and alert. X-ray unremarkable, skin tear repaired with the assistance of nursing staff, patient discharged in stable condition. Final Clinical Impressions(s) / ED Diagnoses   Final diagnoses:  Fall at home  Fall, initial encounter  Avulsion of skin of elbow, right, initial encounter      Gerhard Munch, MD 04/04/17 [phone]

## 2017-04-04 NOTE — Discharge Instructions (Signed)
It is important that you keep your elbow wound clean, dry and dressed with a small amount of antibiotic ointment.  On Sunday you may remove today's dressing, wash the wound (gently) and apply a fresh bandage.  This routine should be repeated every other day until the wound has healed.  Return here for concerning changes in your condition.

## 2017-04-04 NOTE — ED Notes (Signed)
Doctor at bedside.

## 2017-04-04 NOTE — ED Triage Notes (Signed)
Pt states that she got up to use the bathroom and fell hurting her R forearm, did not hit head, no LOC

## 2017-07-04 ENCOUNTER — Emergency Department (HOSPITAL_COMMUNITY)
Admission: EM | Admit: 2017-07-04 | Discharge: 2017-07-04 | Disposition: A | Discharge status: Home / Self Care | Payer: Medicare Other | Attending: Emergency Medicine | Admitting: Emergency Medicine

## 2017-07-04 ENCOUNTER — Encounter (HOSPITAL_COMMUNITY): Payer: Self-pay | Admitting: Emergency Medicine

## 2017-07-04 ENCOUNTER — Emergency Department (HOSPITAL_COMMUNITY): Payer: Medicare Other

## 2017-07-04 DIAGNOSIS — R079 Chest pain, unspecified: Secondary | ICD-10-CM | POA: Diagnosis not present

## 2017-07-04 DIAGNOSIS — Z87891 Personal history of nicotine dependence: Secondary | ICD-10-CM | POA: Insufficient documentation

## 2017-07-04 DIAGNOSIS — W19XXXA Unspecified fall, initial encounter: Secondary | ICD-10-CM | POA: Diagnosis not present

## 2017-07-04 DIAGNOSIS — Z96642 Presence of left artificial hip joint: Secondary | ICD-10-CM | POA: Insufficient documentation

## 2017-07-04 DIAGNOSIS — Y999 Unspecified external cause status: Secondary | ICD-10-CM | POA: Diagnosis not present

## 2017-07-04 DIAGNOSIS — Z79899 Other long term (current) drug therapy: Secondary | ICD-10-CM | POA: Diagnosis not present

## 2017-07-04 DIAGNOSIS — Y92129 Unspecified place in nursing home as the place of occurrence of the external cause: Secondary | ICD-10-CM | POA: Insufficient documentation

## 2017-07-04 DIAGNOSIS — F0390 Unspecified dementia without behavioral disturbance: Secondary | ICD-10-CM | POA: Diagnosis not present

## 2017-07-04 DIAGNOSIS — Z7982 Long term (current) use of aspirin: Secondary | ICD-10-CM | POA: Diagnosis not present

## 2017-07-04 DIAGNOSIS — Y939 Activity, unspecified: Secondary | ICD-10-CM | POA: Insufficient documentation

## 2017-07-04 DIAGNOSIS — S0083XA Contusion of other part of head, initial encounter: Secondary | ICD-10-CM | POA: Diagnosis not present

## 2017-07-04 DIAGNOSIS — S0990XA Unspecified injury of head, initial encounter: Secondary | ICD-10-CM

## 2017-07-04 LAB — I-STAT TROPONIN, ED
TROPONIN I, POC: 0 ng/mL (ref 0.00–0.08)
Troponin i, poc: 0 ng/mL (ref 0.00–0.08)

## 2017-07-04 LAB — CBC WITH DIFFERENTIAL/PLATELET
Basophils Absolute: 0 10*3/uL (ref 0.0–0.1)
Basophils Relative: 0 %
EOS PCT: 2 %
Eosinophils Absolute: 0.2 10*3/uL (ref 0.0–0.7)
HCT: 40.1 % (ref 36.0–46.0)
Hemoglobin: 12.7 g/dL (ref 12.0–15.0)
LYMPHS ABS: 2.3 10*3/uL (ref 0.7–4.0)
LYMPHS PCT: 27 %
MCH: 30.9 pg (ref 26.0–34.0)
MCHC: 31.7 g/dL (ref 30.0–36.0)
MCV: 97.6 fL (ref 78.0–100.0)
Monocytes Absolute: 0.9 10*3/uL (ref 0.1–1.0)
Monocytes Relative: 10 %
Neutro Abs: 5.3 10*3/uL (ref 1.7–7.7)
Neutrophils Relative %: 61 %
PLATELETS: 259 10*3/uL (ref 150–400)
RBC: 4.11 MIL/uL (ref 3.87–5.11)
RDW: 14.5 % (ref 11.5–15.5)
WBC: 8.7 10*3/uL (ref 4.0–10.5)

## 2017-07-04 LAB — COMPREHENSIVE METABOLIC PANEL
ALK PHOS: 59 U/L (ref 38–126)
ALT: 13 U/L — AB (ref 14–54)
AST: 20 U/L (ref 15–41)
Albumin: 2.9 g/dL — ABNORMAL LOW (ref 3.5–5.0)
Anion gap: 9 (ref 5–15)
BUN: 15 mg/dL (ref 6–20)
CALCIUM: 9.4 mg/dL (ref 8.9–10.3)
CHLORIDE: 100 mmol/L — AB (ref 101–111)
CO2: 29 mmol/L (ref 22–32)
CREATININE: 0.98 mg/dL (ref 0.44–1.00)
GFR, EST AFRICAN AMERICAN: 56 mL/min — AB (ref >60)
GFR, EST NON AFRICAN AMERICAN: 48 mL/min — AB (ref >60)
Glucose, Bld: 97 mg/dL (ref 65–99)
Potassium: 4.2 mmol/L (ref 3.5–5.1)
Sodium: 138 mmol/L (ref 135–145)
TOTAL PROTEIN: 6.5 g/dL (ref 6.5–8.1)
Total Bilirubin: 0.4 mg/dL (ref 0.3–1.2)

## 2017-07-04 NOTE — ED Triage Notes (Addendum)
Per EMS, pt from [facility]. Pt reports fall yesterday and got up on her own, did not report to staff. Pt reports since then, intermittent cp increased pain with movement. Pt has swelling to upper lip with dried blood and pain to rt forehead upon palpation. Pt also reports pain to left eye, with yellow drainage and red conjunctiva. Pt takes baby asa/day. Pt denies pain at this time. Hx dementia at baseline, pt ambulates with walker.   EMS VS 128/78, HR 74, 97% room air, 18 R.

## 2017-07-04 NOTE — ED Provider Notes (Signed)
[facility] EMERGENCY DEPARTMENT Provider Note   CSN: [id number] Arrival date & time: 07/04/17  0041     History   Chief Complaint Chief Complaint  Patient presents with  . Chest Pain  . Fall    HPI [name] [name] is a 84 y.o. female.  Patient presents to the emergency department for evaluation after a fall.  Patient apparently fell earlier today.  She is a resident in a nursing home.  Staff did not witness the fall, she told them that she fell.  She reports that she fell forward, hit her forehead and her upper lip.  She is experiencing pain and bruising in these areas.  Intermittently tonight, however, she has been telling the staff that she had chest pain.  She is not sure what was causing the pain or if it was related to the fall.  She has not currently having any chest pain.  Patient denies shortness of breath.      Past Medical History:  Diagnosis Date  . Arthritis   . Cataract   . Glaucoma     Patient Active Problem List   Diagnosis Date Noted  . Closed comminuted intertrochanteric fracture of femur with delayed healing, subsequent encounter 08/21/2016  . Fall   . Closed right hip fracture (HCC) 08/20/2016  . Leukocytosis 08/20/2016  . Hyperglycemia 08/20/2016  . Dementia 08/20/2016  . Osteoporosis 08/20/2016  . HLD (hyperlipidemia) 08/20/2016  . Elevated BP without diagnosis of hypertension 08/20/2016  . Displaced intertrochanteric fracture of right femur, initial encounter for closed fracture (HCC) 08/20/2016  . Glaucoma 07/01/2016  . Acute respiratory failure with hypoxemia (HCC) 07/01/2016  . Rheumatoid arthritis (HCC) 04/18/2016  . Subacute confusional state 04/18/2016  . Closed displaced fracture of left femoral neck (HCC) 04/13/2016  . Hip fracture requiring operative repair, left, closed, initial encounter (HCC) 04/13/2016    Past Surgical History:  Procedure Laterality Date  . EYE SURGERY    . FEMUR IM NAIL Right 08/20/2016   Procedure: INTRAMEDULLARY (IM) NAIL FEMORAL RIGHT HIP;  Surgeon: Samson Frederic, MD;  Location: [facility] ORS;  Service: Orthopedics;  Laterality: Right;  . HIP ARTHROPLASTY Left 04/13/2016   Procedure: ARTHROPLASTY BIPOLAR HIP (HEMIARTHROPLASTY);  Surgeon: Beverely Low, MD;  Location: [facility] OR;  Service: Orthopedics;  Laterality: Left;  . right hand surgery      OB History    No data available       Home Medications    Prior to Admission medications   Medication Sig Start Date End Date Taking? Authorizing Provider  albuterol (PROVENTIL) (2.5 MG/3ML) 0.083% nebulizer solution Take 2.5 mg by nebulization every 6 (six) hours as needed for wheezing or shortness of breath.   Yes [provider]  aspirin 81 MG chewable tablet Chew by mouth daily.   Yes [provider]  Cholecalciferol (VITAMIN D3) 2000 units capsule Take 2,000 Units by mouth daily.   Yes [provider]  cycloSPORINE (RESTASIS) 0.05 % ophthalmic emulsion Place 1 drop into both eyes 2 (two) times daily.   Yes [provider]  diclofenac sodium (VOLTAREN) 1 % GEL Apply 4 g topically 2 (two) times daily.   Yes [provider]  folic acid (FOLVITE) 1 MG tablet Take 1 mg by mouth daily.  05/13/13  Yes [provider]  LORazepam (ATIVAN) 0.5 MG tablet Take 0.25 mg by mouth 2 (two) times daily as needed for anxiety.   Yes [provider]  Melatonin 5 MG TABS Take  5 mg by mouth at bedtime.   Yes [provider]  methotrexate (RHEUMATREX) 2.5 MG tablet Take 20 mg by mouth once a week. On Wednesday.   Yes [provider]  mirtazapine (REMERON) 15 MG tablet Take 15 mg by mouth at bedtime.   Yes [provider]  risperiDONE (RISPERDAL) 0.25 MG tablet Take 0.25 mg by mouth 2 (two) times daily.   Yes [provider]  traMADol (ULTRAM) 50 MG tablet Take 50 mg by mouth 2 (two) times daily.    Yes [provider]  traZODone (DESYREL) 50 MG tablet  Take 50 mg by mouth at bedtime.   Yes [provider]  vitamin B-12 (CYANOCOBALAMIN) 1000 MCG tablet Take 1,000 mcg by mouth daily.   Yes [provider]  enoxaparin (LOVENOX) 30 MG/0.3ML injection Inject 0.3 mLs (30 mg total) into the skin daily. Patient not taking: Reported on 09/02/2016 08/23/16   TatOnalee Hua, MD  polyethylene glycol [facility] Ethelene Hal) packet Take 17 g by mouth daily as needed for mild constipation. Patient not taking: Reported on 07/04/2017 08/22/16   Catarina Hartshorn, MD  senna (SENOKOT) 8.6 MG TABS tablet Take 1 tablet (8.6 mg total) by mouth 2 (two) times daily. Patient not taking: Reported on 07/04/2017 08/22/16   Catarina Hartshorn, MD    Family History Family History  Problem Relation Age of Onset  . Stroke Father   . Stroke Sister   . Anemia Sister   . Lung disease Neg Hx     Social History Social History   Tobacco Use  . Smoking status: Former Smoker    Packs/day: 0.50    Types: Cigarettes    Start date: 06/28/1940    Last attempt to quit: 04/13/2016    Years since quitting: 1.2  . Smokeless tobacco: Never Used  . Tobacco comment: Stopped for a total of 2 years  Substance Use Topics  . Alcohol use: No  . Drug use: No     Allergies   Patient has no known allergies.   Review of Systems Review of Systems  HENT: Positive for facial swelling.   Cardiovascular: Positive for chest pain.  Neurological: Positive for headaches.  All other systems reviewed and are negative.    Physical Exam Updated Vital Signs BP (!) 143/70   Pulse (!) 57   Temp 97.8 F (36.6 C) (Oral)   Resp 14   Ht 5\' 8"  (1.727 m)   Wt 45.4 kg (100 lb)   SpO2 99%   BMI 15.20 kg/m   Physical Exam  Constitutional: She is oriented to person, place, and time. She appears well-developed and well-nourished. No distress.  HENT:  Head: Normocephalic. Head is with contusion (central upper lip).  Right Ear: Hearing normal.  Left Ear: Hearing normal.  Nose: Nose normal.    Mouth/Throat: Oropharynx is clear and moist and mucous membranes are normal.  Eyes: Conjunctivae and EOM are normal. Pupils are equal, round, and reactive to light.  Neck: Normal range of motion. Neck supple.  Cardiovascular: Regular rhythm, S1 normal and S2 normal. Exam reveals no gallop and no friction rub.  No murmur heard. Pulmonary/Chest: Effort normal and breath sounds normal. No respiratory distress. She exhibits no tenderness.  Abdominal: Soft. Normal appearance and bowel sounds are normal. There is no hepatosplenomegaly. There is no tenderness. There is no rebound, no guarding, no tenderness at McBurney's point and negative Murphy's sign. No hernia.  Musculoskeletal: Normal range of motion.  Neurological: She is alert  and oriented to person, place, and time. She has normal strength. No cranial nerve deficit or sensory deficit. Coordination normal. GCS eye subscore is 4. GCS verbal subscore is 5. GCS motor subscore is 6.  Skin: Skin is warm, dry and intact. No rash noted. No cyanosis.  Psychiatric: She has a normal mood and affect. Her speech is normal and behavior is normal. Thought content normal.  Nursing note and vitals reviewed.    ED Treatments / Results  Labs (all labs ordered are listed, but only abnormal results are displayed) Labs Reviewed  COMPREHENSIVE METABOLIC PANEL - Abnormal; Notable for the following components:      Result Value   Chloride 100 (*)    Albumin 2.9 (*)    ALT 13 (*)    GFR calc non Af Amer 48 (*)    GFR calc Af Amer 56 (*)    All other components within normal limits  CBC WITH DIFFERENTIAL/PLATELET  I-STAT TROPONIN, ED  I-STAT TROPONIN, ED    EKG  EKG Interpretation  Date/Time:  Friday July 04 2017 00:48:44 EST Ventricular Rate:  76 PR Interval:    QRS Duration: 96 QT Interval:  450 QTC Calculation: 506 R Axis:   6 Text Interpretation:  Sinus rhythm RSR' in V1 or V2, right VCD or RVH Borderline T abnormalities, diffuse leads  Prolonged QT interval Confirmed by Gilda Crease [phone]) on 07/04/2017 12:57:44 AM       Radiology Dg Chest 2 View  Result Date: 07/04/2017 CLINICAL DATA:  Initial evaluation for acute chest pain, recent fall. EXAM: CHEST - 2 VIEW COMPARISON:  Prior radiograph from 08/20/2016. FINDINGS: Moderate cardiomegaly, stable. Mediastinal silhouette normal. Aortic atherosclerosis. Lungs are hyperinflated with changes suggestive of COPD. Mild right basilar subsegmental atelectasis. No focal infiltrates. No pulmonary edema or pleural effusion. No pneumothorax. Multilevel compression deformities involving the upper/midthoracic spine are chronic in appearance. No acute osseous abnormality. IMPRESSION: 1. Mild right basilar subsegmental atelectasis. No other active cardiopulmonary disease. 2. Hyperinflation with changes suggestive of COPD. 3. Chronic upper thoracic compression deformities. Electronically Signed   By: Rise Mu M.D.   On: 07/04/2017 02:42   Ct Head Wo Contrast  Result Date: 07/04/2017 CLINICAL DATA:  84 year old post fall with head, face, and cervical spine injury. EXAM: CT HEAD WITHOUT CONTRAST CT MAXILLOFACIAL WITHOUT CONTRAST CT CERVICAL SPINE WITHOUT CONTRAST TECHNIQUE: Multidetector CT imaging of the head, cervical spine, and maxillofacial structures were performed using the standard protocol without intravenous contrast. Multiplanar CT image reconstructions of the cervical spine and maxillofacial structures were also generated. COMPARISON:  Head and cervical spine CT 08/20/2016 FINDINGS: CT HEAD FINDINGS Brain: Unchanged degree of atrophy and chronic small vessel ischemia. Remote lacunar infarct in the left basal ganglia. No intracranial hemorrhage, mass effect, or midline shift. No hydrocephalus. The basilar cisterns are patent. No evidence of territorial infarct or acute ischemia. No extra-axial or intracranial fluid collection. Vascular: Atherosclerosis of skullbase vasculature  without hyperdense vessel or abnormal calcification. Skull: No fracture or focal lesion. Other: None. CT MAXILLOFACIAL FINDINGS Osseous: Nasal bone, zygomatic arches, and mandibles are intact. The temporomandibular joints are congruent. Degenerative flattening of both mandibular condyles. Streak artifact from dental hardware. Orbits: No orbital fracture. No globe injury. Bilateral lens extraction. Sinuses: No sinus fracture or fluid level. Mucosal thickening involving ethmoid air cells and bilateral maxillary sinuses. Mastoid air cells are clear. Soft tissues: Negative. CT CERVICAL SPINE FINDINGS Alignment: Exaggerated cervical lordosis. Mild degenerative anterolisthesis of C5 on C6 and C6 on  C7, unchanged. No traumatic subluxation. Skull base and vertebrae: No acute fracture. Remote T1 superior endplate compression fracture. Cervical vertebral body heights are maintained. The dens and skull base are intact. Soft tissues and spinal canal: No prevertebral fluid or swelling. No visible canal hematoma. Disc levels: Diffuse disc space narrowing, most prominent at C6-C7. Scattered facet arthropathy. Degenerative changes are stable from prior exam. Upper chest: No acute finding. Aortic atherosclerosis. Mild emphysema. Biapical pleuroparenchymal scarring. Other: None. IMPRESSION: 1. No acute intracranial abnormality. No skull fracture. Unchanged atrophy and chronic small vessel ischemia. 2. No facial bone fracture. 3. No fracture or subluxation of the cervical spine. Degenerative changes are stable. Electronically Signed   By: Rubye Oaks M.D.   On: 07/04/2017 02:34   Ct Cervical Spine Wo Contrast  Result Date: 07/04/2017 CLINICAL DATA:  84 year old post fall with head, face, and cervical spine injury. EXAM: CT HEAD WITHOUT CONTRAST CT MAXILLOFACIAL WITHOUT CONTRAST CT CERVICAL SPINE WITHOUT CONTRAST TECHNIQUE: Multidetector CT imaging of the head, cervical spine, and maxillofacial structures were performed using  the standard protocol without intravenous contrast. Multiplanar CT image reconstructions of the cervical spine and maxillofacial structures were also generated. COMPARISON:  Head and cervical spine CT 08/20/2016 FINDINGS: CT HEAD FINDINGS Brain: Unchanged degree of atrophy and chronic small vessel ischemia. Remote lacunar infarct in the left basal ganglia. No intracranial hemorrhage, mass effect, or midline shift. No hydrocephalus. The basilar cisterns are patent. No evidence of territorial infarct or acute ischemia. No extra-axial or intracranial fluid collection. Vascular: Atherosclerosis of skullbase vasculature without hyperdense vessel or abnormal calcification. Skull: No fracture or focal lesion. Other: None. CT MAXILLOFACIAL FINDINGS Osseous: Nasal bone, zygomatic arches, and mandibles are intact. The temporomandibular joints are congruent. Degenerative flattening of both mandibular condyles. Streak artifact from dental hardware. Orbits: No orbital fracture. No globe injury. Bilateral lens extraction. Sinuses: No sinus fracture or fluid level. Mucosal thickening involving ethmoid air cells and bilateral maxillary sinuses. Mastoid air cells are clear. Soft tissues: Negative. CT CERVICAL SPINE FINDINGS Alignment: Exaggerated cervical lordosis. Mild degenerative anterolisthesis of C5 on C6 and C6 on C7, unchanged. No traumatic subluxation. Skull base and vertebrae: No acute fracture. Remote T1 superior endplate compression fracture. Cervical vertebral body heights are maintained. The dens and skull base are intact. Soft tissues and spinal canal: No prevertebral fluid or swelling. No visible canal hematoma. Disc levels: Diffuse disc space narrowing, most prominent at C6-C7. Scattered facet arthropathy. Degenerative changes are stable from prior exam. Upper chest: No acute finding. Aortic atherosclerosis. Mild emphysema. Biapical pleuroparenchymal scarring. Other: None. IMPRESSION: 1. No acute intracranial  abnormality. No skull fracture. Unchanged atrophy and chronic small vessel ischemia. 2. No facial bone fracture. 3. No fracture or subluxation of the cervical spine. Degenerative changes are stable. Electronically Signed   By: Rubye Oaks M.D.   On: 07/04/2017 02:34   Ct Maxillofacial Wo Contrast  Result Date: 07/04/2017 CLINICAL DATA:  84 year old post fall with head, face, and cervical spine injury. EXAM: CT HEAD WITHOUT CONTRAST CT MAXILLOFACIAL WITHOUT CONTRAST CT CERVICAL SPINE WITHOUT CONTRAST TECHNIQUE: Multidetector CT imaging of the head, cervical spine, and maxillofacial structures were performed using the standard protocol without intravenous contrast. Multiplanar CT image reconstructions of the cervical spine and maxillofacial structures were also generated. COMPARISON:  Head and cervical spine CT 08/20/2016 FINDINGS: CT HEAD FINDINGS Brain: Unchanged degree of atrophy and chronic small vessel ischemia. Remote lacunar infarct in the left basal ganglia. No intracranial hemorrhage, mass effect, or midline shift. No hydrocephalus.  The basilar cisterns are patent. No evidence of territorial infarct or acute ischemia. No extra-axial or intracranial fluid collection. Vascular: Atherosclerosis of skullbase vasculature without hyperdense vessel or abnormal calcification. Skull: No fracture or focal lesion. Other: None. CT MAXILLOFACIAL FINDINGS Osseous: Nasal bone, zygomatic arches, and mandibles are intact. The temporomandibular joints are congruent. Degenerative flattening of both mandibular condyles. Streak artifact from dental hardware. Orbits: No orbital fracture. No globe injury. Bilateral lens extraction. Sinuses: No sinus fracture or fluid level. Mucosal thickening involving ethmoid air cells and bilateral maxillary sinuses. Mastoid air cells are clear. Soft tissues: Negative. CT CERVICAL SPINE FINDINGS Alignment: Exaggerated cervical lordosis. Mild degenerative anterolisthesis of C5 on C6 and C6  on C7, unchanged. No traumatic subluxation. Skull base and vertebrae: No acute fracture. Remote T1 superior endplate compression fracture. Cervical vertebral body heights are maintained. The dens and skull base are intact. Soft tissues and spinal canal: No prevertebral fluid or swelling. No visible canal hematoma. Disc levels: Diffuse disc space narrowing, most prominent at C6-C7. Scattered facet arthropathy. Degenerative changes are stable from prior exam. Upper chest: No acute finding. Aortic atherosclerosis. Mild emphysema. Biapical pleuroparenchymal scarring. Other: None. IMPRESSION: 1. No acute intracranial abnormality. No skull fracture. Unchanged atrophy and chronic small vessel ischemia. 2. No facial bone fracture. 3. No fracture or subluxation of the cervical spine. Degenerative changes are stable. Electronically Signed   By: Rubye Oaks M.D.   On: 07/04/2017 02:34    Procedures Procedures (including critical care time)  Medications Ordered in ED Medications - No data to display   Initial Impression / Assessment and Plan / ED Course  I have reviewed the triage vital signs and the nursing notes.  Pertinent labs & imaging results that were available during my care of the patient were reviewed by me and considered in my medical decision making (see chart for details).     Patient presents to the emergency department for evaluation after a fall.  Patient fell at some time earlier in the day, has been complaining of headache, pain across her forehead and her upper lip.  She did have swollen and bruised upper lip consistent with hitting her mouth on the ground.  Patient underwent CT head and cervical spine, no acute abnormality noted.  She has been complaining of chest pain.  By the time she arrived in the ER she is no longer having chest pain.  She has been here for an extended period of time, 2 troponins negative.  She has not had any further chest pain.  Patient will therefore be returned  to [facility], follow-up with PCP.  Final Clinical Impressions(s) / ED Diagnoses   Final diagnoses:  Injury of head, initial encounter  Chest pain, unspecified type  Facial contusion, initial encounter    ED Discharge Orders    None       Gilda Crease, MD 07/04/17 [phone]

## 2017-07-04 NOTE — ED Notes (Signed)
Patient transported to CT 

## 2017-07-04 NOTE — ED Notes (Signed)
ED Provider at bedside. 

## 2017-07-04 NOTE — ED Notes (Signed)
Pt verbalizes understanding of d/c instructions. [facility] notified about pt's discharge. Pt waiting for PTAR. All belongings in pt's possession.

## 2017-07-04 NOTE — ED Notes (Signed)
Pt discharged with PTAR 

## 2017-12-16 IMAGING — CR DG CHEST 1V
1 series · 1 of 1 positions shown · non-contrast
Comparison: [id number]

CLINICAL DATA: RIGHT hip fracture, bilateral hip pain

EXAM:
CHEST 1 VIEW

[x chest ap]
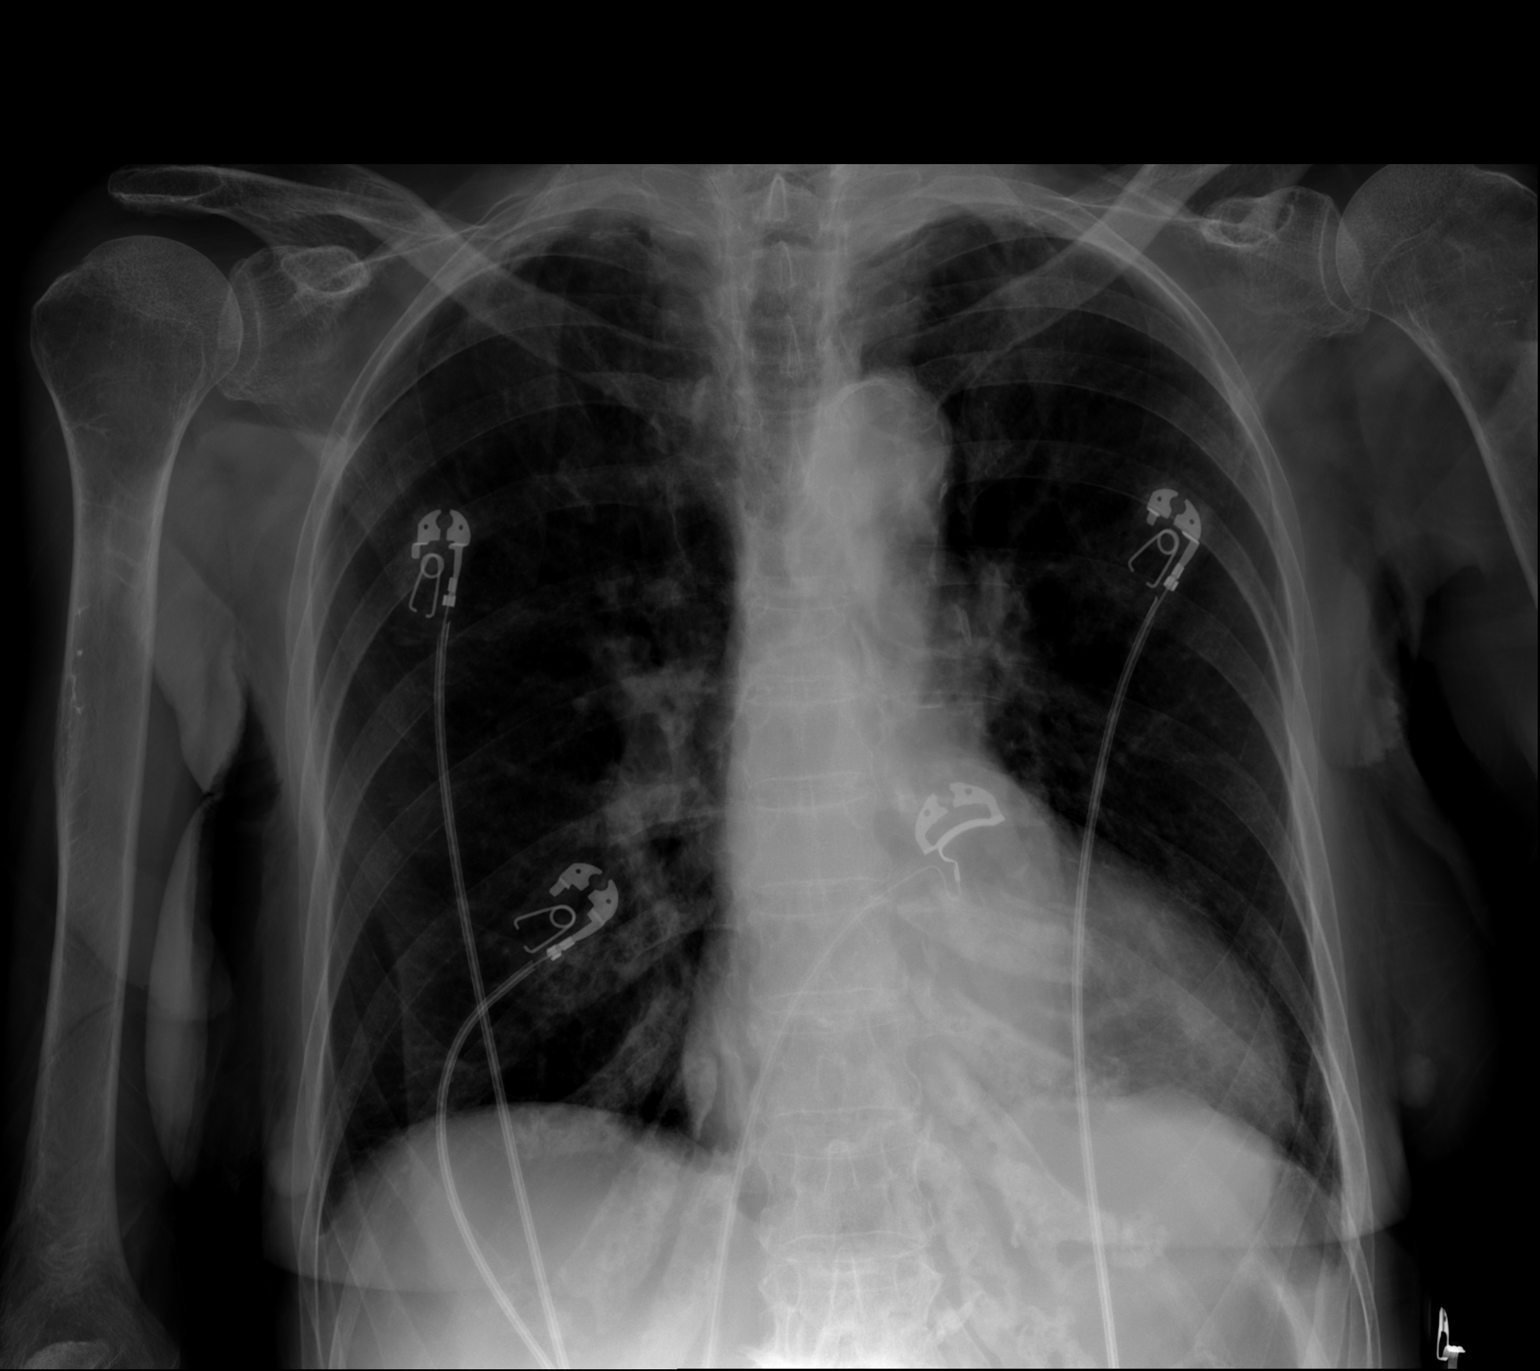

[1 of 1 positions shown; findings below may reference images not displayed]

FINDINGS: Normal cardiac silhouette. Lungs are hyperinflated. No effusion,
infiltrate or pneumothorax.
IMPRESSION: Hyperinflated lungs.  No acute findings.

## 2018-01-03 ENCOUNTER — Other Ambulatory Visit: Payer: Self-pay

## 2018-01-03 ENCOUNTER — Emergency Department (HOSPITAL_COMMUNITY)
Admission: EM | Admit: 2018-01-03 | Discharge: 2018-01-03 | Disposition: A | Discharge status: Skilled Nursing Facility | Payer: Medicare Other | Attending: Emergency Medicine | Admitting: Emergency Medicine

## 2018-01-03 ENCOUNTER — Emergency Department (HOSPITAL_COMMUNITY): Payer: Medicare Other

## 2018-01-03 DIAGNOSIS — F0390 Unspecified dementia without behavioral disturbance: Secondary | ICD-10-CM | POA: Insufficient documentation

## 2018-01-03 DIAGNOSIS — M79604 Pain in right leg: Secondary | ICD-10-CM | POA: Insufficient documentation

## 2018-01-03 DIAGNOSIS — M978XXA Periprosthetic fracture around other internal prosthetic joint, initial encounter: Secondary | ICD-10-CM

## 2018-01-03 DIAGNOSIS — M9701XA Periprosthetic fracture around internal prosthetic right hip joint, initial encounter: Secondary | ICD-10-CM | POA: Diagnosis not present

## 2018-01-03 DIAGNOSIS — Z87891 Personal history of nicotine dependence: Secondary | ICD-10-CM | POA: Diagnosis not present

## 2018-01-03 DIAGNOSIS — R531 Weakness: Secondary | ICD-10-CM | POA: Diagnosis present

## 2018-01-03 DIAGNOSIS — R2242 Localized swelling, mass and lump, left lower limb: Secondary | ICD-10-CM | POA: Insufficient documentation

## 2018-01-03 DIAGNOSIS — Z96649 Presence of unspecified artificial hip joint: Secondary | ICD-10-CM

## 2018-01-03 DIAGNOSIS — M79605 Pain in left leg: Secondary | ICD-10-CM | POA: Insufficient documentation

## 2018-01-03 LAB — URINALYSIS, ROUTINE W REFLEX MICROSCOPIC
Bilirubin Urine: NEGATIVE
GLUCOSE, UA: NEGATIVE mg/dL
HGB URINE DIPSTICK: NEGATIVE
KETONES UR: NEGATIVE mg/dL
LEUKOCYTES UA: NEGATIVE
Nitrite: NEGATIVE
PH: 7 (ref 5.0–8.0)
PROTEIN: NEGATIVE mg/dL
Specific Gravity, Urine: 1.01 (ref 1.005–1.030)

## 2018-01-03 LAB — COMPREHENSIVE METABOLIC PANEL
ALBUMIN: 3.1 g/dL — AB (ref 3.5–5.0)
ALT: 16 U/L (ref 0–44)
ANION GAP: 7 (ref 5–15)
AST: 24 U/L (ref 15–41)
Alkaline Phosphatase: 61 U/L (ref 38–126)
BUN: 23 mg/dL (ref 8–23)
CALCIUM: 9.1 mg/dL (ref 8.9–10.3)
CO2: 31 mmol/L (ref 22–32)
Chloride: 105 mmol/L (ref 98–111)
Creatinine, Ser: 1.06 mg/dL — ABNORMAL HIGH (ref 0.44–1.00)
GFR calc Af Amer: 51 mL/min — ABNORMAL LOW (ref >60)
GFR calc non Af Amer: 44 mL/min — ABNORMAL LOW (ref >60)
GLUCOSE: 98 mg/dL (ref 70–99)
Potassium: 3.8 mmol/L (ref 3.5–5.1)
SODIUM: 143 mmol/L (ref 135–145)
Total Bilirubin: 0.5 mg/dL (ref 0.3–1.2)
Total Protein: 6.4 g/dL — ABNORMAL LOW (ref 6.5–8.1)

## 2018-01-03 LAB — CBC
HEMATOCRIT: 38 % (ref 36.0–46.0)
HEMOGLOBIN: 12.3 g/dL (ref 12.0–15.0)
MCH: 31.1 pg (ref 26.0–34.0)
MCHC: 32.4 g/dL (ref 30.0–36.0)
MCV: 96 fL (ref 78.0–100.0)
Platelets: 254 10*3/uL (ref 150–400)
RBC: 3.96 MIL/uL (ref 3.87–5.11)
RDW: 15.5 % (ref 11.5–15.5)
WBC: 4.9 10*3/uL (ref 4.0–10.5)

## 2018-01-03 NOTE — ED Provider Notes (Signed)
Discussed x-ray and lab results with son.  Small periprosthetic fracture of the greater trochanter on the left.  Labs are okay including renal function.  Mildly low albumin.  Her bilateral lower extremity edema could be related to this.  DVT ultrasound canceled by Dr. Pilar Plate.  She otherwise appears stable for discharge back to her facility.  Weightbearing as tolerated.   Pricilla Loveless, MD 01/03/18 [phone]

## 2018-01-03 NOTE — ED Triage Notes (Signed)
Patient states she falls all the time and thinks she hit her head?  States if she states off her leg the pain is relieved.

## 2018-01-03 NOTE — ED Notes (Signed)
Patient transported to X-ray 

## 2018-01-03 NOTE — Discharge Instructions (Addendum)
There is a small fracture surrounding the left hip replacement.  You are able to walk and bear weight as tolerated with the walker.  Follow-up with your orthopedist.

## 2018-01-03 NOTE — ED Triage Notes (Signed)
Patient reports she fell x 1 month ago and her left leg. Today she is complaining of bilateral leg pain and swelling.  Pain is premodiately located in right thigh. No recent falls within past week, no blackouts.  Per EMS, patient is A & O x3.   Denies N/V  Pain 2/10

## 2018-01-03 NOTE — ED Notes (Signed)
Bed: WA08 Expected date:  Expected time:  Means of arrival:  Comments: Leg swelling

## 2018-01-03 NOTE — ED Notes (Signed)
Facility states patient is complaining of left leg pain up to her knee.

## 2018-01-03 NOTE — ED Provider Notes (Addendum)
[facility] Emergency Department Provider Note MRN:  [id number]  Arrival date & time: 01/03/18     Chief Complaint   Weakness and Leg Pain   History of Present Illness   [name] [name] is a 84 y.o. year-old female with a history of dementia presenting to the ED with chief complaint of leg pain.  Per report, patient comes from nursing care facility, they are concerned about her left leg being swollen, pain in bilateral legs.  Report of a fall 1 month ago.  I was unable to obtain an accurate HPI, PMH, or ROS due to the patient's dementia.  Review of Systems  Positive for leg pain  Patient's Health History    Past Medical History:  Diagnosis Date  . Arthritis   . Cataract   . Glaucoma     Past Surgical History:  Procedure Laterality Date  . EYE SURGERY    . FEMUR IM NAIL Right 08/20/2016   Procedure: INTRAMEDULLARY (IM) NAIL FEMORAL RIGHT HIP;  Surgeon: Samson Frederic, MD;  Location: [facility];  Service: Orthopedics;  Laterality: Right;  . HIP ARTHROPLASTY Left 04/13/2016   Procedure: ARTHROPLASTY BIPOLAR HIP (HEMIARTHROPLASTY);  Surgeon: Beverely Low, MD;  Location: [facility] OR;  Service: Orthopedics;  Laterality: Left;  . right hand surgery      Family History  Problem Relation Age of Onset  . Stroke Father   . Stroke Sister   . Anemia Sister   . Lung disease Neg Hx     Social History   Socioeconomic History  . Marital status: Widowed    Spouse name: Not on file  . Number of children: Not on file  . Years of education: Not on file  . Highest education level: Not on file  Occupational History  . Not on file  Social Needs  . Financial resource strain: Not on file  . Food insecurity:    Worry: Not on file    Inability: Not on file  . Transportation needs:    Medical: Not on file    Non-medical: Not on file  Tobacco Use  . Smoking status: Former Smoker    Packs/day: 0.50    Types: Cigarettes    Start date: 06/28/1942    Last attempt to quit:  04/13/2016    Years since quitting: 1.7  . Smokeless tobacco: Never Used  . Tobacco comment: Stopped for a total of 2 years  Substance and Sexual Activity  . Alcohol use: No  . Drug use: No  . Sexual activity: Not on file  Lifestyle  . Physical activity:    Days per week: Not on file    Minutes per session: Not on file  . Stress: Not on file  Relationships  . Social connections:    Talks on phone: Not on file    Gets together: Not on file    Attends religious service: Not on file    Active member of club or organization: Not on file    Attends meetings of clubs or organizations: Not on file    Relationship status: Not on file  . Intimate partner violence:    Fear of current or ex partner: Not on file    Emotionally abused: Not on file    Physically abused: Not on file    Forced sexual activity: Not on file  Other Topics Concern  . Not on file  Social History Narrative   Leota [facility] (07/01/16):   Originally from [place] 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 No pets currently. She lives by herself at a condo.      Physical Exam  Vital Signs and Nursing Notes reviewed Vitals:   01/03/18 1355 01/03/18 1407  BP:  123/66  Pulse:  73  Resp:  16  Temp:  97.9 F (36.6 C)  SpO2: 96% 97%    CONSTITUTIONAL: Well-appearing, NAD NEURO:  Alert and oriented x 3, no focal deficits EYES:  eyes equal and reactive ENT/NECK:  no LAD, no JVD CARDIO: Regular rate, well-perfused, normal S1 and S2 PULM:  CTAB no wheezing or rhonchi GI/GU:  normal bowel sounds, non-distended, non-tender MSK/SPINE:  No gross deformities, 1+ pitting edema bilateral lower extremities, symmetric, no increased warmth, no erythema SKIN:  no rash, atraumatic PSYCH:  Appropriate speech and behavior  Diagnostic and Interventional Summary    EKG Interpretation  Date/Time:    Ventricular Rate:    PR Interval:    QRS Duration:   QT Interval:    QTC Calculation:   R  Axis:     Text Interpretation:        Labs Reviewed  CBC  COMPREHENSIVE METABOLIC PANEL  URINALYSIS, ROUTINE W REFLEX MICROSCOPIC    DG Femur Min 2 Views Left    (Results Pending)  DG Femur Min 2 Views Right    (Results Pending)  DG Tibia/Fibula Left    (Results Pending)  DG Tibia/Fibula Right    (Results Pending)    Medications - No data to display   Procedures Critical Care  ED Course and Medical Decision Making  I have reviewed the triage vital signs and the nursing notes.  Pertinent labs & imaging results that were available during my care of the patient were reviewed by me and considered in my medical decision making (see below for details).    Unclear reason for patient's visit today, report of bilateral leg pain, report of left leg swelling, report of trauma 1 month ago.  Patient with significant dementia, currently denying pain or any issues.  Low concern for DVT or fracture given exam, cannot exclude, images pending.  Anticipating discharge after negative work-up.  X-ray reveals left intertrochanteric fracture.  Discussed with orthopedics, no intervention needed, weightbearing as tolerated, pain control, no need for admission.  Awaiting labs, anticipating discharge.  Signed out to oncoming provider.  Elmer Sow. Pilar Plate, MD [facility] Emergency Medicine [facility] [email or web address]  Final Clinical Impressions(s) / ED Diagnoses     ICD-10-CM   1. Leg pain, bilateral M79.604    M79.605     ED Discharge Orders    None         Sabas Sous, MD 01/03/18 1629    Sabas Sous, MD 01/03/18 1704    Sabas Sous, MD 01/03/18 [phone]

## 2018-01-24 ENCOUNTER — Inpatient Hospital Stay (HOSPITAL_COMMUNITY)
Admission: EM | Admit: 2018-01-24 | Discharge: 2018-01-29 | DRG: 853 | Disposition: A | Discharge status: Skilled Nursing Facility | Payer: Medicare Other | Attending: Internal Medicine | Admitting: Internal Medicine

## 2018-01-24 ENCOUNTER — Emergency Department (HOSPITAL_COMMUNITY): Payer: Medicare Other

## 2018-01-24 ENCOUNTER — Encounter (HOSPITAL_COMMUNITY): Payer: Self-pay | Admitting: Emergency Medicine

## 2018-01-24 DIAGNOSIS — Z681 Body mass index (BMI) 19 or less, adult: Secondary | ICD-10-CM

## 2018-01-24 DIAGNOSIS — Z7189 Other specified counseling: Secondary | ICD-10-CM

## 2018-01-24 DIAGNOSIS — A419 Sepsis, unspecified organism: Secondary | ICD-10-CM | POA: Diagnosis not present

## 2018-01-24 DIAGNOSIS — R627 Adult failure to thrive: Secondary | ICD-10-CM | POA: Diagnosis present

## 2018-01-24 DIAGNOSIS — R509 Fever, unspecified: Secondary | ICD-10-CM | POA: Diagnosis present

## 2018-01-24 DIAGNOSIS — N179 Acute kidney failure, unspecified: Secondary | ICD-10-CM | POA: Diagnosis present

## 2018-01-24 DIAGNOSIS — Z66 Do not resuscitate: Secondary | ICD-10-CM | POA: Diagnosis present

## 2018-01-24 DIAGNOSIS — L03317 Cellulitis of buttock: Secondary | ICD-10-CM | POA: Diagnosis present

## 2018-01-24 DIAGNOSIS — Z7901 Long term (current) use of anticoagulants: Secondary | ICD-10-CM

## 2018-01-24 DIAGNOSIS — F0390 Unspecified dementia without behavioral disturbance: Secondary | ICD-10-CM | POA: Diagnosis present

## 2018-01-24 DIAGNOSIS — N390 Urinary tract infection, site not specified: Secondary | ICD-10-CM | POA: Diagnosis present

## 2018-01-24 DIAGNOSIS — T8132XA Disruption of internal operation (surgical) wound, not elsewhere classified, initial encounter: Secondary | ICD-10-CM | POA: Diagnosis present

## 2018-01-24 DIAGNOSIS — Z23 Encounter for immunization: Secondary | ICD-10-CM

## 2018-01-24 DIAGNOSIS — M1990 Unspecified osteoarthritis, unspecified site: Secondary | ICD-10-CM | POA: Diagnosis present

## 2018-01-24 DIAGNOSIS — R64 Cachexia: Secondary | ICD-10-CM | POA: Diagnosis present

## 2018-01-24 DIAGNOSIS — E878 Other disorders of electrolyte and fluid balance, not elsewhere classified: Secondary | ICD-10-CM | POA: Diagnosis not present

## 2018-01-24 DIAGNOSIS — E43 Unspecified severe protein-calorie malnutrition: Secondary | ICD-10-CM | POA: Diagnosis present

## 2018-01-24 DIAGNOSIS — E870 Hyperosmolality and hypernatremia: Secondary | ICD-10-CM | POA: Diagnosis not present

## 2018-01-24 DIAGNOSIS — L89314 Pressure ulcer of right buttock, stage 4: Secondary | ICD-10-CM | POA: Diagnosis present

## 2018-01-24 DIAGNOSIS — R6520 Severe sepsis without septic shock: Secondary | ICD-10-CM | POA: Diagnosis present

## 2018-01-24 DIAGNOSIS — G9349 Other encephalopathy: Secondary | ICD-10-CM | POA: Diagnosis present

## 2018-01-24 DIAGNOSIS — Z515 Encounter for palliative care: Secondary | ICD-10-CM | POA: Diagnosis not present

## 2018-01-24 DIAGNOSIS — M069 Rheumatoid arthritis, unspecified: Secondary | ICD-10-CM | POA: Diagnosis present

## 2018-01-24 DIAGNOSIS — B9620 Unspecified Escherichia coli [E. coli] as the cause of diseases classified elsewhere: Secondary | ICD-10-CM | POA: Diagnosis present

## 2018-01-24 DIAGNOSIS — M9701XD Periprosthetic fracture around internal prosthetic right hip joint, subsequent encounter: Secondary | ICD-10-CM

## 2018-01-24 DIAGNOSIS — Z7401 Bed confinement status: Secondary | ICD-10-CM

## 2018-01-24 DIAGNOSIS — D72829 Elevated white blood cell count, unspecified: Secondary | ICD-10-CM | POA: Diagnosis present

## 2018-01-24 DIAGNOSIS — W19XXXD Unspecified fall, subsequent encounter: Secondary | ICD-10-CM | POA: Diagnosis present

## 2018-01-24 DIAGNOSIS — L89319 Pressure ulcer of right buttock, unspecified stage: Secondary | ICD-10-CM

## 2018-01-24 DIAGNOSIS — M609 Myositis, unspecified: Secondary | ICD-10-CM | POA: Diagnosis present

## 2018-01-24 DIAGNOSIS — H409 Unspecified glaucoma: Secondary | ICD-10-CM | POA: Diagnosis present

## 2018-01-24 DIAGNOSIS — Z791 Long term (current) use of non-steroidal anti-inflammatories (NSAID): Secondary | ICD-10-CM

## 2018-01-24 DIAGNOSIS — E876 Hypokalemia: Secondary | ICD-10-CM | POA: Diagnosis not present

## 2018-01-24 DIAGNOSIS — G9341 Metabolic encephalopathy: Secondary | ICD-10-CM | POA: Diagnosis present

## 2018-01-24 DIAGNOSIS — H269 Unspecified cataract: Secondary | ICD-10-CM | POA: Diagnosis present

## 2018-01-24 DIAGNOSIS — Z823 Family history of stroke: Secondary | ICD-10-CM

## 2018-01-24 DIAGNOSIS — E8809 Other disorders of plasma-protein metabolism, not elsewhere classified: Secondary | ICD-10-CM | POA: Diagnosis present

## 2018-01-24 DIAGNOSIS — S7291XG Unspecified fracture of right femur, subsequent encounter for closed fracture with delayed healing: Secondary | ICD-10-CM

## 2018-01-24 DIAGNOSIS — M869 Osteomyelitis, unspecified: Secondary | ICD-10-CM | POA: Diagnosis present

## 2018-01-24 DIAGNOSIS — Z7982 Long term (current) use of aspirin: Secondary | ICD-10-CM

## 2018-01-24 LAB — CBC WITH DIFFERENTIAL/PLATELET
BASOS ABS: 0 10*3/uL (ref 0.0–0.1)
BASOS PCT: 0 %
Eosinophils Absolute: 0 10*3/uL (ref 0.0–0.7)
Eosinophils Relative: 0 %
HEMATOCRIT: 41.4 % (ref 36.0–46.0)
HEMOGLOBIN: 13.2 g/dL (ref 12.0–15.0)
LYMPHS ABS: 0.8 10*3/uL (ref 0.7–4.0)
LYMPHS PCT: 4 %
MCH: 30.3 pg (ref 26.0–34.0)
MCHC: 31.9 g/dL (ref 30.0–36.0)
MCV: 95.2 fL (ref 78.0–100.0)
MONOS PCT: 2 %
Monocytes Absolute: 0.4 10*3/uL (ref 0.1–1.0)
NEUTROS ABS: 19.2 10*3/uL — AB (ref 1.7–7.7)
Neutrophils Relative %: 94 %
Platelets: 479 10*3/uL — ABNORMAL HIGH (ref 150–400)
RBC: 4.35 MIL/uL (ref 3.87–5.11)
RDW: 16.9 % — AB (ref 11.5–15.5)
WBC: 20.4 10*3/uL — ABNORMAL HIGH (ref 4.0–10.5)

## 2018-01-24 LAB — URINALYSIS, ROUTINE W REFLEX MICROSCOPIC
Bilirubin Urine: NEGATIVE
Glucose, UA: NEGATIVE mg/dL
Ketones, ur: NEGATIVE mg/dL
Nitrite: POSITIVE — AB
PROTEIN: 30 mg/dL — AB
SPECIFIC GRAVITY, URINE: 1.012 (ref 1.005–1.030)
pH: 7 (ref 5.0–8.0)

## 2018-01-24 LAB — BLOOD GAS, VENOUS
ACID-BASE EXCESS: 1.4 mmol/L (ref 0.0–2.0)
Bicarbonate: 24.5 mmol/L (ref 20.0–28.0)
FIO2: 21
O2 Saturation: 83.2 %
PCO2 VEN: 36 mmHg — AB (ref 44.0–60.0)
PH VEN: 7.448 — AB (ref 7.250–7.430)
Patient temperature: 98.2
pO2, Ven: 47.5 mmHg — ABNORMAL HIGH (ref 32.0–45.0)

## 2018-01-24 LAB — COMPREHENSIVE METABOLIC PANEL
ALBUMIN: 2.3 g/dL — AB (ref 3.5–5.0)
ALK PHOS: 106 U/L (ref 38–126)
ALT: 17 U/L (ref 0–44)
ANION GAP: 9 (ref 5–15)
AST: 22 U/L (ref 15–41)
BILIRUBIN TOTAL: 0.9 mg/dL (ref 0.3–1.2)
BUN: 60 mg/dL — AB (ref 8–23)
CALCIUM: 8.8 mg/dL — AB (ref 8.9–10.3)
CO2: 23 mmol/L (ref 22–32)
Chloride: 113 mmol/L — ABNORMAL HIGH (ref 98–111)
Creatinine, Ser: 1.12 mg/dL — ABNORMAL HIGH (ref 0.44–1.00)
GFR calc Af Amer: 48 mL/min — ABNORMAL LOW (ref >60)
GFR calc non Af Amer: 41 mL/min — ABNORMAL LOW (ref >60)
GLUCOSE: 103 mg/dL — AB (ref 70–99)
POTASSIUM: 4.7 mmol/L (ref 3.5–5.1)
SODIUM: 145 mmol/L (ref 135–145)
TOTAL PROTEIN: 6.5 g/dL (ref 6.5–8.1)

## 2018-01-24 LAB — I-STAT CG4 LACTIC ACID, ED
LACTIC ACID, VENOUS: 1.82 mmol/L (ref 0.5–1.9)
Lactic Acid, Venous: 2.29 mmol/L (ref 0.5–1.9)

## 2018-01-24 MED ORDER — SODIUM CHLORIDE 0.9 % IV BOLUS
250.0000 mL | Freq: Once | INTRAVENOUS | Status: AC
  Administered 2018-01-24: 21:00:00 via INTRAVENOUS

## 2018-01-24 MED ORDER — SODIUM CHLORIDE 0.9 % IV SOLN
1.0000 g | Freq: Once | INTRAVENOUS | Status: AC
  Administered 2018-01-24: 1 g via INTRAVENOUS
  Filled 2018-01-24: qty 10

## 2018-01-24 MED ORDER — VANCOMYCIN HCL IN DEXTROSE 1-5 GM/200ML-% IV SOLN
1000.0000 mg | Freq: Once | INTRAVENOUS | Status: AC
  Administered 2018-01-24: 1000 mg via INTRAVENOUS
  Filled 2018-01-24: qty 200

## 2018-01-24 MED ORDER — SODIUM CHLORIDE 0.9 % IV BOLUS
250.0000 mL | Freq: Once | INTRAVENOUS | Status: AC
  Administered 2018-01-24: 23:00:00 via INTRAVENOUS

## 2018-01-24 NOTE — ED Provider Notes (Signed)
[facility]-EMERGENCY DEPT Provider Note   CSN: [id number] Arrival date & time: 01/24/18  1922     History   Chief Complaint Chief Complaint  Patient presents with  . possible sepsis    HPI [name] [name] is a 84 y.o. female.  Patient with history of COPD, non-oxygen dependent, bilateral previous hip arthroplasty, rheumatoid arthritis, dementia --presents to the emergency department today with decline over the past 3 days.  Son, Chip, at bedside states that patient has stopped eating and drinking over the past several days.  Typically she is somewhat confused but is somewhat conversant and able to recognize family members.  This is not been the case over the past several days.  Evaluated by hospice yesterday and told that she did not qualify.  Patient does have some bedsores reported per son.  Patient is a DNR.  She would want other medical therapies however.  Level 5 caveat due to dementia.     Past Medical History:  Diagnosis Date  . Arthritis   . Cataract   . Glaucoma     Patient Active Problem List   Diagnosis Date Noted  . Closed comminuted intertrochanteric fracture of femur with delayed healing, subsequent encounter 08/21/2016  . Fall   . Closed right hip fracture (HCC) 08/20/2016  . Leukocytosis 08/20/2016  . Hyperglycemia 08/20/2016  . Dementia 08/20/2016  . Osteoporosis 08/20/2016  . HLD (hyperlipidemia) 08/20/2016  . Elevated BP without diagnosis of hypertension 08/20/2016  . Displaced intertrochanteric fracture of right femur, initial encounter for closed fracture (HCC) 08/20/2016  . Glaucoma 07/01/2016  . Acute respiratory failure with hypoxemia (HCC) 07/01/2016  . Rheumatoid arthritis (HCC) 04/18/2016  . Subacute confusional state 04/18/2016  . Closed displaced fracture of left femoral neck (HCC) 04/13/2016  . Hip fracture requiring operative repair, left, closed, initial encounter (HCC) 04/13/2016    Past Surgical History:    Procedure Laterality Date  . EYE SURGERY    . FEMUR IM NAIL Right 08/20/2016   Procedure: INTRAMEDULLARY (IM) NAIL FEMORAL RIGHT HIP;  Surgeon: Samson Frederic, MD;  Location: [facility];  Service: Orthopedics;  Laterality: Right;  . HIP ARTHROPLASTY Left 04/13/2016   Procedure: ARTHROPLASTY BIPOLAR HIP (HEMIARTHROPLASTY);  Surgeon: Beverely Low, MD;  Location: [facility] OR;  Service: Orthopedics;  Laterality: Left;  . right hand surgery       OB History   None      Home Medications    Prior to Admission medications   Medication Sig Start Date End Date Taking? Authorizing Provider  albuterol (PROVENTIL) (2.5 MG/3ML) 0.083% nebulizer solution Take 2.5 mg by nebulization every 6 (six) hours as needed for wheezing or shortness of breath.    [provider]  aspirin 81 MG chewable tablet Chew by mouth daily.    [provider]  Cholecalciferol (VITAMIN D3) 2000 units capsule Take 2,000 Units by mouth daily.    [provider]  cycloSPORINE (RESTASIS) 0.05 % ophthalmic emulsion Place 1 drop into both eyes 2 (two) times daily.    [provider]  diclofenac sodium (VOLTAREN) 1 % GEL Apply 4 g topically 2 (two) times daily.    [provider]  enoxaparin (LOVENOX) 30 MG/0.3ML injection Inject 0.3 mLs (30 mg total) into the skin daily. Patient not taking: Reported on 09/02/2016 08/23/16   Catarina Hartshorn, MD  folic acid (FOLVITE) 1 MG tablet Take 1 mg by mouth daily.  05/13/13   [provider]  LORazepam (ATIVAN) 0.5 MG tablet  Take 0.25 mg by mouth 2 (two) times daily as needed for anxiety.    [provider]  Melatonin 5 MG TABS Take 5 mg by mouth at bedtime.    [provider]  methotrexate (RHEUMATREX) 2.5 MG tablet Take 20 mg by mouth once a week. On Wednesday.    [provider]  mirtazapine (REMERON) 15 MG tablet Take 15 mg by mouth at bedtime.    [provider]  polyethylene glycol (MIRALAX / GLYCOLAX) packet Take  17 g by mouth daily as needed for mild constipation. Patient not taking: Reported on 07/04/2017 08/22/16   Catarina Hartshorn, MD  risperiDONE (RISPERDAL) 0.25 MG tablet Take 0.25 mg by mouth 2 (two) times daily.    [provider]  senna (SENOKOT) 8.6 MG TABS tablet Take 1 tablet (8.6 mg total) by mouth 2 (two) times daily. Patient not taking: Reported on 07/04/2017 08/22/16   Catarina Hartshorn, MD  traMADol (ULTRAM) 50 MG tablet Take 50 mg by mouth 2 (two) times daily.     [provider]  traZODone (DESYREL) 50 MG tablet Take 50 mg by mouth at bedtime.    [provider]  vitamin B-12 (CYANOCOBALAMIN) 1000 MCG tablet Take 1,000 mcg by mouth daily.    [provider]    Family History Family History  Problem Relation Age of Onset  . Stroke Father   . Stroke Sister   . Anemia Sister   . Lung disease Neg Hx     Social History Social History   Tobacco Use  . Smoking status: Former Smoker    Packs/day: 0.50    Types: Cigarettes    Start date: 06/28/1942    Last attempt to quit: 04/13/2016    Years since quitting: 1.7  . Smokeless tobacco: Never Used  . Tobacco comment: Stopped for a total of 2 years  Substance Use Topics  . Alcohol use: No  . Drug use: No     Allergies   Patient has no known allergies.   Review of Systems Review of Systems  Unable to perform ROS: Dementia     Physical Exam Updated Vital Signs BP 138/75   Pulse 88   Temp 98.2 F (36.8 C) (Oral)   Resp (!) 23   SpO2 92%   Physical Exam  Constitutional: She appears well-developed and well-nourished. She appears lethargic.  HENT:  Head: Normocephalic and atraumatic.  Mouth/Throat: Oropharynx is clear and moist.  Eyes: Conjunctivae are normal. Right eye exhibits no discharge. Left eye exhibits no discharge.  Neck: Normal range of motion. Neck supple.  Cardiovascular: Normal rate, regular rhythm and normal heart sounds.  No murmur heard. Pulmonary/Chest: Effort normal and breath  sounds normal. No respiratory distress. She has no wheezes. She has no rales.  Abdominal: Soft. There is no tenderness. There is no rebound and no guarding.  Neurological: She appears lethargic.  Skin: Skin is warm and dry.  Venous stasis changes of bilateral LE. Some bandages in place over skin tears.   2cm ulceration of the R buttock with surrounding erythema. Foul smelling. No active drainage.   Psychiatric: She has a normal mood and affect.  Nursing note and vitals reviewed.    ED Treatments / Results  Labs (all labs ordered are listed, but only abnormal results are displayed) Labs Reviewed  COMPREHENSIVE METABOLIC PANEL - Abnormal; Notable for the following components:      Result Value   Chloride 113 (*)    Glucose, Bld 103 (*)  BUN 60 (*)    Creatinine, Ser 1.12 (*)    Calcium 8.8 (*)    Albumin 2.3 (*)    GFR calc non Af Amer 41 (*)    GFR calc Af Amer 48 (*)    All other components within normal limits  CBC WITH DIFFERENTIAL/PLATELET - Abnormal; Notable for the following components:   WBC 20.4 (*)    RDW 16.9 (*)    Platelets 479 (*)    Neutro Abs 19.2 (*)    All other components within normal limits  URINALYSIS, ROUTINE W REFLEX MICROSCOPIC - Abnormal; Notable for the following components:   Color, Urine AMBER (*)    APPearance CLOUDY (*)    Hgb urine dipstick LARGE (*)    Protein, ur 30 (*)    Nitrite POSITIVE (*)    Leukocytes, UA LARGE (*)    WBC, UA >50 (*)    Bacteria, UA MANY (*)    All other components within normal limits  BLOOD GAS, VENOUS - Abnormal; Notable for the following components:   pH, Ven 7.448 (*)    pCO2, Ven 36.0 (*)    pO2, Ven 47.5 (*)    All other components within normal limits  I-STAT CG4 LACTIC ACID, ED - Abnormal; Notable for the following components:   Lactic Acid, Venous 2.29 (*)    All other components within normal limits  CULTURE, BLOOD (ROUTINE X 2)  CULTURE, BLOOD (ROUTINE X 2)  URINE CULTURE  I-STAT CG4 LACTIC  ACID, ED    ED ECG REPORT   Date: 01/24/2018  Rate: 110  Rhythm: sinus tachycardia  QRS Axis: normal  Intervals: normal  ST/T Wave abnormalities: nonspecific ST/T changes  Conduction Disutrbances:none  Narrative Interpretation:   Old EKG Reviewed: none available  I have personally reviewed the EKG tracing and agree with the computerized printout as noted.  Radiology Dg Chest Portable 1 View  Result Date: 01/24/2018 CLINICAL DATA:  Altered level of consciousness EXAM: PORTABLE CHEST 1 VIEW COMPARISON:  07/04/2017 FINDINGS: No focal opacity or pleural effusion. Cardiomediastinal silhouette within normal limits. Aortic atherosclerosis. No pneumothorax. Subacute to old left fourth rib fracture. IMPRESSION: No active disease. Electronically Signed   By: Jasmine Pang M.D.   On: 01/24/2018 20:27    Procedures Procedures (including critical care time)  Medications Ordered in ED Medications  cefTRIAXone (ROCEPHIN) 1 g in sodium chloride 0.9 % 100 mL IVPB (1 g Intravenous New Bag/Given 01/24/18 2151)  sodium chloride 0.9 % bolus 250 mL (has no administration in time range)  sodium chloride 0.9 % bolus 250 mL (0 mLs Intravenous Stopped 01/24/18 2149)     Initial Impression / Assessment and Plan / ED Course  I have reviewed the triage vital signs and the nursing notes.  Pertinent labs & imaging results that were available during my care of the patient were reviewed by me and considered in my medical decision making (see chart for details).     Patient seen and examined. Work-up initiated. Fluids ordered. Discussed case with Dr. Erma Heritage who will see patient.   Vital signs reviewed and are as follows: BP 138/75   Pulse 88   Temp 98.2 F (36.8 C) (Oral)   Resp (!) 23   SpO2 92%   10:12 PM patient resting comfortably.  She has obvious urinary tract infection on UA.  I reevaluated her cubitus ulcer.  Added vancomycin.  Discussed case with hospitalist who will see patient.  Ordered  CT of the pelvis  to assess decubitus ulcer further.   CRITICAL CARE Performed by: Renne Crigler PA-C Total critical care time: 40 minutes Critical care time was exclusive of separately billable procedures and treating other patients. Critical care was necessary to treat or prevent imminent or life-threatening deterioration. Critical care was time spent personally by me on the following activities: development of treatment plan with patient and/or surrogate as well as nursing, discussions with consultants, evaluation of patient's response to treatment, examination of patient, obtaining history from patient or surrogate, ordering and performing treatments and interventions, ordering and review of laboratory studies, ordering and review of radiographic studies, pulse oximetry and re-evaluation of patient's condition.   Final Clinical Impressions(s) / ED Diagnoses   Final diagnoses:  Sepsis, due to unspecified organism  Urinary tract infection without hematuria, site unspecified  Pressure injury of skin of right buttock, unspecified injury stage   Admit.   ED Discharge Orders    None       Renne Crigler, Cordelia Poche 01/24/18 2218    Shaune Pollack, MD 01/26/18 [phone]

## 2018-01-24 NOTE — Progress Notes (Signed)
A consult was received from an ED physician for vancomycin per pharmacy dosing.  The patient's profile has been reviewed for ht/wt/allergies/indication/available labs.   A one time order has been placed for Vancomycin 1gm iv x1.  Further antibiotics/pharmacy consults should be ordered by admitting physician if indicated.                       Thank you, Aleene Davidson Crowford 01/24/2018  10:29 PM

## 2018-01-24 NOTE — ED Notes (Signed)
ED TO INPATIENT HANDOFF REPORT  Name/Age/Gender [name] [name] 84 y.o. female  Code Status Code Status History    Date Active Date Inactive Code Status Order ID Comments User Context   08/20/2016 2256 08/22/2016 2023 Full Code [id number]  Rod Can, MD Inpatient   08/20/2016 2256 08/20/2016 2256 Full Code [id number]  Barton Dubois, MD Inpatient   04/13/2016 1948 04/16/2016 1815 Full Code [id number]  Etta Quill, DO ED    Advance Directive Documentation     Most Recent Value  Type of Advance Directive  Out of facility DNR (pink MOST or yellow form)  Pre-existing out of facility DNR order (yellow form or pink MOST form)  -  "MOST" Form in Place?  -      Home/SNF/Other Nursing Home  Chief Complaint sepsis  Level of Care/Admitting Diagnosis ED Disposition    ED Disposition Condition [place]: [facility] [100102]  Level of Care: Med-Surg [16]  Diagnosis: Sepsis [facility]) [[id number]]  Admitting Physician: Rise Patience [phone]  Attending Physician: Rise Patience [email or web address]  PT Class (Do Not Modify): Observation [104]  PT Acc Code (Do Not Modify): Observation [10022]       Medical History Past Medical History:  Diagnosis Date  . Arthritis   . Cataract   . Glaucoma     Allergies No Known Allergies  IV Location/Drains/Wounds Patient Lines/Drains/Airways Status   Active Line/Drains/Airways    Name:   Placement date:   Placement time:   Site:   Days:   Peripheral IV 01/03/18 Left Antecubital   01/03/18    1645    Antecubital   21   Peripheral IV 01/24/18 Right;Lateral Forearm   01/24/18    2030    Forearm   less than 1   Peripheral IV 01/24/18 Left;Posterior Forearm   01/24/18    2100    Forearm   less than 1   Incision (Closed) 04/13/16 Leg Left   04/13/16    2247     651   Incision (Closed) 08/20/16 Hip Right   08/20/16    1925     522   Wound / Incision (Open or Dehisced) 04/04/17 Other (Comment) Arm Right    04/04/17    0940    Arm   295          Labs/Imaging Results for orders placed or performed during the hospital encounter of 01/24/18 (from the past 48 hour(s))  Blood gas, venous     Status: Abnormal   Collection Time: 01/24/18  8:38 PM  Result Value Ref Range   FIO2 21.00    pH, Ven 7.448 (H) 7.250 - 7.430   pCO2, Ven 36.0 (L) 44.0 - 60.0 mmHg   pO2, Ven 47.5 (H) 32.0 - 45.0 mmHg   Bicarbonate 24.5 20.0 - 28.0 mmol/L   Acid-Base Excess 1.4 0.0 - 2.0 mmol/L   O2 Saturation 83.2 %   Patient temperature 98.2    Collection site VEIN    Drawn by COLLECTED BY NURSE    Sample type VENOUS     Comment: Performed at [facility], [place] [address] [place], [place] [address]  I-Stat CG4 Lactic Acid, ED  (not at  [facility])     Status: Abnormal   Collection Time: 01/24/18  8:41 PM  Result Value Ref Range   Lactic Acid, Venous 2.29 (HH) 0.5 - 1.9 mmol/L   Comment NOTIFIED PHYSICIAN   Comprehensive metabolic panel  Status: Abnormal   Collection Time: 01/24/18  8:43 PM  Result Value Ref Range   Sodium 145 135 - 145 mmol/L   Potassium 4.7 3.5 - 5.1 mmol/L   Chloride 113 (H) 98 - 111 mmol/L   CO2 23 22 - 32 mmol/L   Glucose, Bld 103 (H) 70 - 99 mg/dL   BUN 60 (H) 8 - 23 mg/dL   Creatinine, Ser 1.12 (H) 0.44 - 1.00 mg/dL   Calcium 8.8 (L) 8.9 - 10.3 mg/dL   Total Protein 6.5 6.5 - 8.1 g/dL   Albumin 2.3 (L) 3.5 - 5.0 g/dL   AST 22 15 - 41 U/L   ALT 17 0 - 44 U/L   Alkaline Phosphatase 106 38 - 126 U/L   Total Bilirubin 0.9 0.3 - 1.2 mg/dL   GFR calc non Af Amer 41 (L) >60 mL/min   GFR calc Af Amer 48 (L) >60 mL/min    Comment: (NOTE) The eGFR has been calculated using the CKD EPI equation. This calculation has not been validated in all clinical situations. eGFR's persistently <60 mL/min signify possible Chronic Kidney Disease.    Anion gap 9 5 - 15    Comment: Performed at [facility], [place] [address] [place], [place] [address]  CBC WITH  DIFFERENTIAL     Status: Abnormal   Collection Time: 01/24/18  8:43 PM  Result Value Ref Range   WBC 20.4 (H) 4.0 - 10.5 K/uL   RBC 4.35 3.87 - 5.11 MIL/uL   Hemoglobin 13.2 12.0 - 15.0 g/dL   HCT 41.4 36.0 - 46.0 %   MCV 95.2 78.0 - 100.0 fL   MCH 30.3 26.0 - 34.0 pg   MCHC 31.9 30.0 - 36.0 g/dL   RDW 16.9 (H) 11.5 - 15.5 %   Platelets 479 (H) 150 - 400 K/uL   Neutrophils Relative % 94 %   Lymphocytes Relative 4 %   Monocytes Relative 2 %   Eosinophils Relative 0 %   Basophils Relative 0 %   Neutro Abs 19.2 (H) 1.7 - 7.7 K/uL   Lymphs Abs 0.8 0.7 - 4.0 K/uL   Monocytes Absolute 0.4 0.1 - 1.0 K/uL   Eosinophils Absolute 0.0 0.0 - 0.7 K/uL   Basophils Absolute 0.0 0.0 - 0.1 K/uL   Smear Review MORPHOLOGY UNREMARKABLE     Comment: Performed at [facility], [place] [place] [address], [place], [place] [address]  Urinalysis, Routine w reflex microscopic     Status: Abnormal   Collection Time: 01/24/18  8:43 PM  Result Value Ref Range   Color, Urine AMBER (A) YELLOW    Comment: BIOCHEMICALS MAY BE AFFECTED BY COLOR   APPearance CLOUDY (A) CLEAR   Specific Gravity, Urine 1.012 1.005 - 1.030   pH 7.0 5.0 - 8.0   Glucose, UA NEGATIVE NEGATIVE mg/dL   Hgb urine dipstick LARGE (A) NEGATIVE   Bilirubin Urine NEGATIVE NEGATIVE   Ketones, ur NEGATIVE NEGATIVE mg/dL   Protein, ur 30 (A) NEGATIVE mg/dL   Nitrite POSITIVE (A) NEGATIVE   Leukocytes, UA LARGE (A) NEGATIVE   RBC / HPF 6-10 0 - 5 RBC/hpf   WBC, UA >50 (H) 0 - 5 WBC/hpf   Bacteria, UA MANY (A) NONE SEEN   Squamous Epithelial / LPF 0-5 0 - 5   WBC Clumps PRESENT     Comment: Performed at [facility], [place] [address], [place],  [address]   Dg Chest Portable 1 View  Result Date: 01/24/2018 CLINICAL DATA:  Altered level of consciousness EXAM: PORTABLE CHEST 1 VIEW COMPARISON:  07/04/2017 FINDINGS: No focal opacity or pleural effusion. Cardiomediastinal silhouette within normal limits. Aortic  atherosclerosis. No pneumothorax. Subacute to old left fourth rib fracture. IMPRESSION: No active disease. Electronically Signed   By: Donavan Foil M.D.   On: 01/24/2018 20:27    Pending Labs Unresulted Labs (From admission, onward)    Start     Ordered   01/24/18 2140  Urine Culture  Once,   STAT     01/24/18 2141   01/24/18 2007  Blood Culture (routine x 2)  BLOOD CULTURE X 2,   STAT     01/24/18 2008          Vitals/Pain Today's Vitals   01/24/18 2130 01/24/18 2144 01/24/18 2215 01/24/18 2230  BP: 107/60   (!) 104/57  Pulse: (!) 109  (!) 106 (!) 106  Resp: (!) 21  (!) 21 (!) 21  Temp:      TempSrc:      SpO2: 98%  97% 97%  PainSc:  0-No pain      Isolation Precautions No active isolations  Medications Medications  sodium chloride 0.9 % bolus 250 mL (has no administration in time range)  vancomycin (VANCOCIN) IVPB 1000 mg/200 mL premix (1,000 mg Intravenous New Bag/Given 01/24/18 2242)  sodium chloride 0.9 % bolus 250 mL (0 mLs Intravenous Stopped 01/24/18 2149)  cefTRIAXone (ROCEPHIN) 1 g in sodium chloride 0.9 % 100 mL IVPB (0 g Intravenous Stopped 01/24/18 2242)    Mobility non-ambulatory

## 2018-01-24 NOTE — ED Notes (Signed)
Bed: WA03 Expected date:  Expected time:  Means of arrival:  Comments: 84 yo sepsis

## 2018-01-24 NOTE — ED Triage Notes (Addendum)
Per EMS , pt. From [facility] with complaint of possible sepsis with temp of 95.7'F  Per EMS ,pt. Has hx of uti.  per family pt. Noted to have not been eating and drinking  for the past few days. Pt. Has increasing confusing , has dementia. Pt. Noted to be more weak . Has DNR form from facility.

## 2018-01-25 ENCOUNTER — Observation Stay (HOSPITAL_COMMUNITY): Payer: Medicare Other

## 2018-01-25 ENCOUNTER — Observation Stay (HOSPITAL_COMMUNITY): Payer: Medicare Other | Admitting: Anesthesiology

## 2018-01-25 ENCOUNTER — Encounter (HOSPITAL_COMMUNITY): Payer: Self-pay | Admitting: Internal Medicine

## 2018-01-25 ENCOUNTER — Encounter (HOSPITAL_COMMUNITY)
Admission: EM | Disposition: A | Discharge status: Skilled Nursing Facility | Payer: Self-pay | Source: Home / Self Care | Attending: Internal Medicine

## 2018-01-25 ENCOUNTER — Other Ambulatory Visit: Payer: Self-pay

## 2018-01-25 DIAGNOSIS — N3000 Acute cystitis without hematuria: Secondary | ICD-10-CM

## 2018-01-25 DIAGNOSIS — A419 Sepsis, unspecified organism: Secondary | ICD-10-CM | POA: Diagnosis not present

## 2018-01-25 DIAGNOSIS — M069 Rheumatoid arthritis, unspecified: Secondary | ICD-10-CM

## 2018-01-25 DIAGNOSIS — L89319 Pressure ulcer of right buttock, unspecified stage: Secondary | ICD-10-CM | POA: Diagnosis not present

## 2018-01-25 DIAGNOSIS — B9620 Unspecified Escherichia coli [E. coli] as the cause of diseases classified elsewhere: Secondary | ICD-10-CM | POA: Insufficient documentation

## 2018-01-25 DIAGNOSIS — M86159 Other acute osteomyelitis, unspecified femur: Secondary | ICD-10-CM

## 2018-01-25 DIAGNOSIS — N390 Urinary tract infection, site not specified: Secondary | ICD-10-CM | POA: Insufficient documentation

## 2018-01-25 HISTORY — PX: IRRIGATION AND DEBRIDEMENT ABSCESS: SHX5252

## 2018-01-25 LAB — CBC WITH DIFFERENTIAL/PLATELET
Basophils Absolute: 0 10*3/uL (ref 0.0–0.1)
Basophils Relative: 0 %
EOS PCT: 0 %
Eosinophils Absolute: 0 10*3/uL (ref 0.0–0.7)
HEMATOCRIT: 38 % (ref 36.0–46.0)
Hemoglobin: 11.7 g/dL — ABNORMAL LOW (ref 12.0–15.0)
LYMPHS ABS: 1.2 10*3/uL (ref 0.7–4.0)
Lymphocytes Relative: 4 %
MCH: 29.8 pg (ref 26.0–34.0)
MCHC: 30.8 g/dL (ref 30.0–36.0)
MCV: 96.9 fL (ref 78.0–100.0)
MONO ABS: 0.9 10*3/uL (ref 0.1–1.0)
Monocytes Relative: 3 %
NEUTROS ABS: 28 10*3/uL — AB (ref 1.7–7.7)
Neutrophils Relative %: 93 %
PLATELETS: 405 10*3/uL — AB (ref 150–400)
RBC: 3.92 MIL/uL (ref 3.87–5.11)
RDW: 17.1 % — AB (ref 11.5–15.5)
WBC: 30.1 10*3/uL — AB (ref 4.0–10.5)

## 2018-01-25 LAB — MRSA PCR SCREENING: MRSA by PCR: NEGATIVE

## 2018-01-25 LAB — COMPREHENSIVE METABOLIC PANEL
ALBUMIN: 2 g/dL — AB (ref 3.5–5.0)
ALK PHOS: 97 U/L (ref 38–126)
ALT: 15 U/L (ref 0–44)
AST: 19 U/L (ref 15–41)
Anion gap: 6 (ref 5–15)
BILIRUBIN TOTAL: 0.6 mg/dL (ref 0.3–1.2)
BUN: 54 mg/dL — AB (ref 8–23)
CHLORIDE: 116 mmol/L — AB (ref 98–111)
CO2: 27 mmol/L (ref 22–32)
CREATININE: 1.11 mg/dL — AB (ref 0.44–1.00)
Calcium: 8.3 mg/dL — ABNORMAL LOW (ref 8.9–10.3)
GFR calc Af Amer: 48 mL/min — ABNORMAL LOW (ref >60)
GFR, EST NON AFRICAN AMERICAN: 41 mL/min — AB (ref >60)
GLUCOSE: 118 mg/dL — AB (ref 70–99)
Potassium: 4.2 mmol/L (ref 3.5–5.1)
Sodium: 149 mmol/L — ABNORMAL HIGH (ref 135–145)
TOTAL PROTEIN: 5.6 g/dL — AB (ref 6.5–8.1)

## 2018-01-25 LAB — PROTIME-INR
INR: 1.08
Prothrombin Time: 13.9 seconds (ref 11.4–15.2)

## 2018-01-25 SURGERY — IRRIGATION AND DEBRIDEMENT ABSCESS
Anesthesia: General | Site: Buttocks | Laterality: Right

## 2018-01-25 MED ORDER — FENTANYL CITRATE (PF) 100 MCG/2ML IJ SOLN
INTRAMUSCULAR | Status: DC | PRN
  Administered 2018-01-25: 25 ug via INTRAVENOUS

## 2018-01-25 MED ORDER — PROPOFOL 10 MG/ML IV BOLUS
INTRAVENOUS | Status: AC
  Filled 2018-01-25: qty 20

## 2018-01-25 MED ORDER — MORPHINE SULFATE (PF) 2 MG/ML IV SOLN
1.0000 mg | INTRAVENOUS | Status: DC | PRN
  Administered 2018-01-25: 1 mg via INTRAVENOUS
  Administered 2018-01-26: 2 mg via INTRAVENOUS
  Administered 2018-01-26 – 2018-01-27 (×3): 1 mg via INTRAVENOUS
  Administered 2018-01-28 – 2018-01-29 (×2): 2 mg via INTRAVENOUS
  Filled 2018-01-25 (×7): qty 1

## 2018-01-25 MED ORDER — ENOXAPARIN SODIUM 40 MG/0.4ML ~~LOC~~ SOLN
40.0000 mg | Freq: Every day | SUBCUTANEOUS | Status: DC
  Administered 2018-01-25: 40 mg via SUBCUTANEOUS
  Filled 2018-01-25: qty 0.4

## 2018-01-25 MED ORDER — 0.9 % SODIUM CHLORIDE (POUR BTL) OPTIME
TOPICAL | Status: DC | PRN
  Administered 2018-01-25: 1000 mL

## 2018-01-25 MED ORDER — ONDANSETRON HCL 4 MG/2ML IJ SOLN: 4.0000 mg | Freq: Four times a day (QID) | INTRAMUSCULAR | Status: DC | PRN

## 2018-01-25 MED ORDER — CYCLOSPORINE 0.05 % OP EMUL
1.0000 [drp] | Freq: Two times a day (BID) | OPHTHALMIC | Status: DC
  Administered 2018-01-25 – 2018-01-29 (×9): 1 [drp] via OPHTHALMIC
  Filled 2018-01-25 (×10): qty 1

## 2018-01-25 MED ORDER — FENTANYL CITRATE (PF) 100 MCG/2ML IJ SOLN
INTRAMUSCULAR | Status: AC
  Filled 2018-01-25: qty 2

## 2018-01-25 MED ORDER — ONDANSETRON HCL 4 MG PO TABS: 4.0000 mg | ORAL_TABLET | Freq: Four times a day (QID) | ORAL | Status: DC | PRN

## 2018-01-25 MED ORDER — INFLUENZA VAC SPLIT HIGH-DOSE 0.5 ML IM SUSY
0.5000 mL | PREFILLED_SYRINGE | INTRAMUSCULAR | Status: AC
  Administered 2018-01-26: 0.5 mL via INTRAMUSCULAR
  Filled 2018-01-25: qty 0.5

## 2018-01-25 MED ORDER — ONDANSETRON HCL 4 MG/2ML IJ SOLN
INTRAMUSCULAR | Status: DC | PRN
  Administered 2018-01-25: 4 mg via INTRAVENOUS

## 2018-01-25 MED ORDER — ACETAMINOPHEN 325 MG PO TABS: 650.0000 mg | ORAL_TABLET | Freq: Four times a day (QID) | ORAL | Status: DC | PRN

## 2018-01-25 MED ORDER — FENTANYL CITRATE (PF) 100 MCG/2ML IJ SOLN: 25.0000 ug | INTRAMUSCULAR | Status: DC | PRN

## 2018-01-25 MED ORDER — SODIUM CHLORIDE 0.9 % IV SOLN: 1.0000 g | INTRAVENOUS | Status: DC

## 2018-01-25 MED ORDER — IOHEXOL 300 MG/ML  SOLN
75.0000 mL | Freq: Once | INTRAMUSCULAR | Status: AC | PRN
  Administered 2018-01-25: 75 mL via INTRAVENOUS

## 2018-01-25 MED ORDER — ALBUMIN HUMAN 5 % IV SOLN
INTRAVENOUS | Status: AC
  Filled 2018-01-25: qty 250

## 2018-01-25 MED ORDER — SUCCINYLCHOLINE CHLORIDE 20 MG/ML IJ SOLN
INTRAMUSCULAR | Status: DC | PRN
  Administered 2018-01-25: 60 mg via INTRAVENOUS

## 2018-01-25 MED ORDER — CHLORHEXIDINE GLUCONATE CLOTH 2 % EX PADS
6.0000 | MEDICATED_PAD | Freq: Every day | CUTANEOUS | Status: DC
  Administered 2018-01-26: 6 via TOPICAL

## 2018-01-25 MED ORDER — ALBUMIN HUMAN 5 % IV SOLN
INTRAVENOUS | Status: DC | PRN
  Administered 2018-01-25: 15:00:00 via INTRAVENOUS

## 2018-01-25 MED ORDER — MUPIROCIN 2 % EX OINT
1.0000 "application " | TOPICAL_OINTMENT | Freq: Two times a day (BID) | CUTANEOUS | Status: DC
  Administered 2018-01-25 – 2018-01-26 (×2): 1 via NASAL
  Filled 2018-01-25 (×2): qty 22

## 2018-01-25 MED ORDER — TRAMADOL HCL 50 MG PO TABS: 25.0000 mg | ORAL_TABLET | Freq: Four times a day (QID) | ORAL | Status: DC | PRN

## 2018-01-25 MED ORDER — LIDOCAINE HCL (CARDIAC) PF 100 MG/5ML IV SOSY
PREFILLED_SYRINGE | INTRAVENOUS | Status: DC | PRN
  Administered 2018-01-25: 40 mg via INTRATRACHEAL

## 2018-01-25 MED ORDER — PROPOFOL 10 MG/ML IV BOLUS
INTRAVENOUS | Status: DC | PRN
  Administered 2018-01-25: 50 mg via INTRAVENOUS

## 2018-01-25 MED ORDER — LACTATED RINGERS IV SOLN: INTRAVENOUS | Status: DC

## 2018-01-25 MED ORDER — SODIUM CHLORIDE 0.9 % IV SOLN
INTRAVENOUS | Status: DC
  Administered 2018-01-25 (×3): via INTRAVENOUS

## 2018-01-25 MED ORDER — ALBUTEROL SULFATE (2.5 MG/3ML) 0.083% IN NEBU: 2.5000 mg | INHALATION_SOLUTION | Freq: Four times a day (QID) | RESPIRATORY_TRACT | Status: DC | PRN

## 2018-01-25 MED ORDER — METRONIDAZOLE IN NACL 5-0.79 MG/ML-% IV SOLN: 500.0000 mg | Freq: Three times a day (TID) | INTRAVENOUS | Status: DC

## 2018-01-25 MED ORDER — SODIUM CHLORIDE 0.9 % IV SOLN
INTRAVENOUS | Status: DC | PRN
  Administered 2018-01-25: 40 ug/min via INTRAVENOUS

## 2018-01-25 MED ORDER — SODIUM CHLORIDE 0.9 % IV SOLN
1.0000 g | Freq: Three times a day (TID) | INTRAVENOUS | Status: DC
  Administered 2018-01-25: 1 g via INTRAVENOUS
  Filled 2018-01-25 (×2): qty 1

## 2018-01-25 MED ORDER — PIPERACILLIN-TAZOBACTAM 3.375 G IVPB
3.3750 g | Freq: Three times a day (TID) | INTRAVENOUS | Status: DC
  Administered 2018-01-25 – 2018-01-29 (×13): 3.375 g via INTRAVENOUS
  Filled 2018-01-25 (×14): qty 50

## 2018-01-25 MED ORDER — ACETAMINOPHEN 650 MG RE SUPP
650.0000 mg | Freq: Four times a day (QID) | RECTAL | Status: DC | PRN
  Administered 2018-01-27: 650 mg via RECTAL
  Filled 2018-01-25: qty 1

## 2018-01-25 MED ORDER — LACTATED RINGERS IV SOLN
INTRAVENOUS | Status: DC | PRN
  Administered 2018-01-25: 14:00:00 via INTRAVENOUS

## 2018-01-25 MED ORDER — VANCOMYCIN HCL 500 MG IV SOLR: 500.0000 mg | INTRAVENOUS | Status: DC

## 2018-01-25 MED ORDER — ENOXAPARIN SODIUM 30 MG/0.3ML ~~LOC~~ SOLN
30.0000 mg | SUBCUTANEOUS | Status: DC
  Administered 2018-01-26 – 2018-01-29 (×4): 30 mg via SUBCUTANEOUS
  Filled 2018-01-25 (×4): qty 0.3

## 2018-01-25 MED ORDER — HALOPERIDOL LACTATE 5 MG/ML IJ SOLN
2.0000 mg | Freq: Once | INTRAMUSCULAR | Status: AC
  Administered 2018-01-26: 2 mg via INTRAVENOUS
  Filled 2018-01-25: qty 1

## 2018-01-25 SURGICAL SUPPLY — 36 items
BNDG GAUZE ELAST 4 BULKY (GAUZE/BANDAGES/DRESSINGS) IMPLANT
COVER SURGICAL LIGHT HANDLE (MISCELLANEOUS) ×3 IMPLANT
DECANTER SPIKE VIAL GLASS SM (MISCELLANEOUS) IMPLANT
DERMABOND ADVANCED (GAUZE/BANDAGES/DRESSINGS)
DERMABOND ADVANCED .7 DNX12 (GAUZE/BANDAGES/DRESSINGS) IMPLANT
DRAPE LAPAROSCOPIC ABDOMINAL (DRAPES) IMPLANT
DRAPE LAPAROTOMY T 102X78X121 (DRAPES) IMPLANT
DRAPE LAPAROTOMY T 98X78 PEDS (DRAPES) IMPLANT
DRAPE LAPAROTOMY TRNSV 102X78 (DRAPE) ×2 IMPLANT
DRAPE SHEET LG 3/4 BI-LAMINATE (DRAPES) IMPLANT
DRAPE UTILITY XL STRL (DRAPES) ×3 IMPLANT
DRSG PAD ABDOMINAL 8X10 ST (GAUZE/BANDAGES/DRESSINGS) IMPLANT
ELECT REM PT RETURN 15FT ADLT (MISCELLANEOUS) ×3 IMPLANT
GAUZE PACKING IODOFORM 1/4X15 (GAUZE/BANDAGES/DRESSINGS) IMPLANT
GAUZE SPONGE 4X4 12PLY STRL (GAUZE/BANDAGES/DRESSINGS) ×3 IMPLANT
GLOVE BIO SURGEON STRL SZ7.5 (GLOVE) ×3 IMPLANT
GLOVE INDICATOR 8.0 STRL GRN (GLOVE) ×3 IMPLANT
GOWN STRL REUS W/TWL XL LVL3 (GOWN DISPOSABLE) ×6 IMPLANT
HANDPIECE INTERPULSE COAX TIP (DISPOSABLE) ×2
KIT BASIN OR (CUSTOM PROCEDURE TRAY) ×3 IMPLANT
MARKER SKIN DUAL TIP RULER LAB (MISCELLANEOUS) ×2 IMPLANT
NDL HYPO 25X1 1.5 SAFETY (NEEDLE) ×1 IMPLANT
NEEDLE HYPO 25X1 1.5 SAFETY (NEEDLE) ×3 IMPLANT
PACK GENERAL/GYN (CUSTOM PROCEDURE TRAY) ×3 IMPLANT
SET HNDPC FAN SPRY TIP SCT (DISPOSABLE) IMPLANT
SET IRRIG Y TYPE TUR BLADDER L (SET/KITS/TRAYS/PACK) ×2 IMPLANT
SPONGE LAP 18X18 RF (DISPOSABLE) IMPLANT
SPONGE LAP 4X18 RFD (DISPOSABLE) IMPLANT
STAPLER VISISTAT 35W (STAPLE) IMPLANT
SUT MNCRL AB 4-0 PS2 18 (SUTURE) IMPLANT
SUT VIC AB 3-0 SH 18 (SUTURE) IMPLANT
SWAB COLLECTION DEVICE MRSA (MISCELLANEOUS) ×2 IMPLANT
SWAB CULTURE ESWAB REG 1ML (MISCELLANEOUS) ×2 IMPLANT
SYR CONTROL 10ML LL (SYRINGE) ×3 IMPLANT
TOWEL OR 17X26 10 PK STRL BLUE (TOWEL DISPOSABLE) ×3 IMPLANT
TOWEL OR NON WOVEN STRL DISP B (DISPOSABLE) ×3 IMPLANT

## 2018-01-25 NOTE — H&P (Addendum)
History and Physical    [name] XNT:[id number] DOB: 1922/08/30 DOA: 01/24/2018  PCP: Alysia Penna, MD  Patient coming from: Skilled nursing facility.  History obtained from patient's son.  Chief Complaint: Altered mental status.  HPI: [name] [name] is a 84 y.o. female with dementia and history of rheumatoid arthritis and dementia was brought to the ER after patient was increasingly confused and having poor oral intake over the past 3 days.  As per the patient's son patient had a fall 6 weeks ago following which patient had periprosthetic hip fractures and was managed conservatively and patient has been largely bedbound and has started developing decubitus ulcers on the right buttock.  Per patient's son patient did not have any nausea vomiting chest pain productive cough or shortness of breath.  ED Course: In the ER patient appears encephalopathic with labs showing elevated lactate and WBC count.  Lactate improved with fluid bolus.  UA is compatible with UTI and on exam patient has large decubitus ulcer in the right buttock.  Patient was started on empiric antibiotics and admitted for sepsis.  Review of Systems: As per HPI, rest all negative.   Past Medical History:  Diagnosis Date  . Arthritis   . Cataract   . Glaucoma     Past Surgical History:  Procedure Laterality Date  . EYE SURGERY    . FEMUR IM NAIL Right 08/20/2016   Procedure: INTRAMEDULLARY (IM) NAIL FEMORAL RIGHT HIP;  Surgeon: Samson Frederic, MD;  Location: [facility];  Service: Orthopedics;  Laterality: Right;  . HIP ARTHROPLASTY Left 04/13/2016   Procedure: ARTHROPLASTY BIPOLAR HIP (HEMIARTHROPLASTY);  Surgeon: Beverely Low, MD;  Location: [facility];  Service: Orthopedics;  Laterality: Left;  . right hand surgery       reports that she quit smoking about 21 months ago. Her smoking use included cigarettes. She started smoking about 75 years ago. She smoked 0.50 packs per day. She has never used smokeless tobacco.  She reports that she does not drink alcohol or use drugs.  No Known Allergies  Family History  Problem Relation Age of Onset  . Stroke Father   . Stroke Sister   . Anemia Sister   . Lung disease Neg Hx     Prior to Admission medications   Medication Sig Start Date End Date Taking? Authorizing Provider  acetaminophen (TYLENOL) 650 MG CR tablet Take 650 mg by mouth 3 (three) times daily.   Yes [provider]  albuterol (PROVENTIL) (2.5 MG/3ML) 0.083% nebulizer solution Take 2.5 mg by nebulization every 6 (six) hours as needed for wheezing or shortness of breath.   Yes [provider]  aspirin 81 MG chewable tablet Chew 81 mg by mouth daily.    Yes [provider]  Cholecalciferol (VITAMIN D3) 2000 units capsule Take 2,000 Units by mouth daily.   Yes [provider]  cycloSPORINE (RESTASIS) 0.05 % ophthalmic emulsion Place 1 drop into both eyes 2 (two) times daily.   Yes [provider]  diclofenac sodium (VOLTAREN) 1 % GEL Apply 4 g topically 2 (two) times daily.   Yes [provider]  folic acid (FOLVITE) 1 MG tablet Take 1 mg by mouth daily.  05/13/13  Yes [provider]  furosemide (LASIX) 20 MG tablet Take 20 mg by mouth 3 (three) times a week.   Yes [provider]  lidocaine (LMX) 4 % cream Apply 1 application topically daily. Apply to left thigh and leg   Yes  [provider]  LORazepam (ATIVAN) 0.5 MG tablet Take 0.25 mg by mouth 2 (two) times daily as needed for anxiety.   Yes [provider]  Melatonin 5 MG TABS Take 5 mg by mouth at bedtime.   Yes [provider]  methotrexate (RHEUMATREX) 2.5 MG tablet Take 20 mg by mouth once a week. On Wednesday.   Yes [provider]  mirtazapine (REMERON) 15 MG tablet Take 15 mg by mouth at bedtime.   Yes [provider]  neomycin-bacitracin-polymyxin (NEOSPORIN) [phone] ointment Apply 1 application topically daily. Apply to  nail bed of affected left toe prior to placing socks in the morning.   Yes [provider]  nitrofurantoin, macrocrystal-monohydrate, (MACROBID) 100 MG capsule Take 100 mg by mouth 2 (two) times daily.   Yes [provider]  NON FORMULARY Apply 1 application topically 2 (two) times daily. Vitamin K Lotion, Apply to legs   Yes [provider]  OLANZapine (ZYPREXA) 2.5 MG tablet Take 1.25 mg by mouth daily.   Yes [provider]  olopatadine (PATANOL) 0.1 % ophthalmic solution Place 2 drops into both eyes daily as needed for allergies.   Yes [provider]  ondansetron (ZOFRAN) 4 MG tablet Take 4 mg by mouth every 12 (twelve) hours as needed for nausea or vomiting.   Yes [provider]  potassium chloride (K-DUR,KLOR-CON) 10 MEQ tablet Take 10 mEq by mouth 3 (three) times a week.   Yes [provider]  traMADol (ULTRAM) 50 MG tablet Take 50 mg by mouth 2 (two) times daily.    Yes [provider]  traZODone (DESYREL) 50 MG tablet Take 50 mg by mouth at bedtime.   Yes [provider]  trolamine salicylate (ASPERCREME) 10 % cream Apply 1 application topically 2 (two) times daily. Apply from left ankle to hip   Yes [provider]  vitamin B-12 (CYANOCOBALAMIN) 1000 MCG tablet Take 1,000 mcg by mouth daily.   Yes [provider]  enoxaparin (LOVENOX) 30 MG/0.3ML injection Inject 0.3 mLs (30 mg total) into the skin daily. Patient not taking: Reported on 09/02/2016 08/23/16   TatOnalee Hua, MD  polyethylene glycol [facility]) packet Take 17 g by mouth daily as needed for mild constipation. Patient not taking: Reported on 07/04/2017 08/22/16   Catarina Hartshorn, MD  senna (SENOKOT) 8.6 MG TABS tablet Take 1 tablet (8.6 mg total) by mouth 2 (two) times daily. Patient not taking: Reported on 07/04/2017 08/22/16   Catarina Hartshorn, MD    Physical Exam: Vitals:   01/24/18 2100 01/24/18 2130 01/24/18 2215 01/24/18 2230  BP:  118/67 107/60  (!) 104/57  Pulse: (!) 109 (!) 109 (!) 106 (!) 106  Resp: 19 (!) 21 (!) 21 (!) 21  Temp:      TempSrc:      SpO2: 100% 98% 97% 97%      Constitutional: Moderately built and nourished. Vitals:   01/24/18 2100 01/24/18 2130 01/24/18 2215 01/24/18 2230  BP: 118/67 107/60  (!) 104/57  Pulse: (!) 109 (!) 109 (!) 106 (!) 106  Resp: 19 (!) 21 (!) 21 (!) 21  Temp:      TempSrc:      SpO2: 100% 98% 97% 97%   Eyes: Anicteric no pallor. ENMT: No discharge from the ears eyes nose or mouth. Neck: No mass felt.  No neck rigidity. Respiratory: No rhonchi or crepitations. Cardiovascular: S1-S2 heard no murmurs appreciated. Abdomen: Soft nontender bowel sounds present. Musculoskeletal:  No edema. Skin: Right buttock decubitus ulcer. Neurologic: Patient is encephalopathic and does not follow commands. Psychiatric: Encephalopathic.   Labs on Admission: I have personally reviewed following labs and imaging studies  CBC: Recent Labs  Lab 01/24/18 2043  WBC 20.4*  NEUTROABS 19.2*  HGB 13.2  HCT 41.4  MCV 95.2  PLT 479*   Basic Metabolic Panel: Recent Labs  Lab 01/24/18 2043  NA 145  K 4.7  CL 113*  CO2 23  GLUCOSE 103*  BUN 60*  CREATININE 1.12*  CALCIUM 8.8*   GFR: CrCl cannot be calculated (Unknown ideal weight.). Liver Function Tests: Recent Labs  Lab 01/24/18 2043  AST 22  ALT 17  ALKPHOS 106  BILITOT 0.9  PROT 6.5  ALBUMIN 2.3*   No results for input(s): LIPASE, AMYLASE in the last 168 hours. No results for input(s): AMMONIA in the last 168 hours. Coagulation Profile: No results for input(s): INR, PROTIME in the last 168 hours. Cardiac Enzymes: No results for input(s): CKTOTAL, CKMB, CKMBINDEX, TROPONINI in the last 168 hours. BNP (last 3 results) No results for input(s): PROBNP in the last 8760 hours. HbA1C: No results for input(s): HGBA1C in the last 72 hours. CBG: No results for input(s): GLUCAP in the last 168 hours. Lipid  Profile: No results for input(s): CHOL, HDL, LDLCALC, TRIG, CHOLHDL, LDLDIRECT in the last 72 hours. Thyroid Function Tests: No results for input(s): TSH, T4TOTAL, FREET4, T3FREE, THYROIDAB in the last 72 hours. Anemia Panel: No results for input(s): VITAMINB12, FOLATE, FERRITIN, TIBC, IRON, RETICCTPCT in the last 72 hours. Urine analysis:    Component Value Date/Time   COLORURINE AMBER (A) 01/24/2018 2043   APPEARANCEUR CLOUDY (A) 01/24/2018 2043   LABSPEC 1.012 01/24/2018 2043   PHURINE 7.0 01/24/2018 2043   GLUCOSEU NEGATIVE 01/24/2018 2043   HGBUR LARGE (A) 01/24/2018 2043   BILIRUBINUR NEGATIVE 01/24/2018 2043   KETONESUR NEGATIVE 01/24/2018 2043   PROTEINUR 30 (A) 01/24/2018 2043   NITRITE POSITIVE (A) 01/24/2018 2043   LEUKOCYTESUR LARGE (A) 01/24/2018 2043   Sepsis Labs: @LABRCNTIP (procalcitonin:4,lacticidven:4) )No results found for this or any previous visit (from the past 240 hour(s)).   Radiological Exams on Admission: Dg Chest Portable 1 View  Result Date: 01/24/2018 CLINICAL DATA:  Altered level of consciousness EXAM: PORTABLE CHEST 1 VIEW COMPARISON:  07/04/2017 FINDINGS: No focal opacity or pleural effusion. Cardiomediastinal silhouette within normal limits. Aortic atherosclerosis. No pneumothorax. Subacute to old left fourth rib fracture. IMPRESSION: No active disease. Electronically Signed   By: 09/03/2017 M.D.   On: 01/24/2018 20:27    EKG: Independently reviewed.  Sinus tachycardia.  Assessment/Plan Principal Problem:   Sepsis (HCC) Active Problems:   Rheumatoid arthritis (HCC)   Dementia   Urinary tract infection without hematuria   Pressure injury of skin of right buttock    1. Sepsis likely from UTI and possible infected right buttock decubitus ulcer -patient has been placed on empiric antibiotics.  Follow cultures.  CT pelvis has been ordered for further study patient's right buttock ulcer for any deep abscess.  Continue with hydration. 2. Acute  encephalopathy likely from sepsis -if mentation does not improve we will get a CT head. 3. Rheumatoid arthritis would hold methotrexate until patient sepsis improves. 4. History of dementia presently encephalopathic and n.p.o. 5. Acute renal failure -creatinine increased from 0.98 in March 2019 to 1.12.  Will gently hydrate and follow metabolic panel.  Addendum -CT pelvis done shows features concerning for necrotizing fasciitis and right hamstring intramuscular  abscess.  Focal cortical license proximal right femur most compatible with osteomyelitis.  Possible enteritis nonspecific fluid collection in the pelvis.  I have consulted Dr. Donell Beers on-call general surgeon.  Will hold on Lovenox and keep patient on SCDs in anticipation of possible procedure.   DVT prophylaxis: SCDs in anticipation of possible procedure. Code Status: DNR. Family Communication: Patient's son. Disposition Plan: Skilled nursing facility when stable. Consults called: General surgery. Admission status: Observation.   Eduard Clos MD [facility] Pager [phone].  If 7PM-7AM, please contact night-coverage [email or web address] Password [email or web address]  01/25/2018, 12:05 AM

## 2018-01-25 NOTE — Interval H&P Note (Signed)
History and Physical Interval Note:  01/25/2018 1:35 PM  [name] [name]  has presented today for surgery, with the diagnosis of decubitus-lateral right side up  The various methods of treatment have been discussed with the patient and family. After consideration of risks, benefits and other options for treatment, the patient has consented to  Procedure(s): IRRIGATION AND DEBRIDEMENT ABSCESS- SACRAL DECUBITUS (Right) as a surgical intervention .  The patient's history has been reviewed, patient examined, no change in status, stable for surgery.  I have reviewed the patient's chart and labs.  Questions were answered to the patient's satisfaction.    Discussed with son via phone  [name] [name]. Andrey Campanile, MD, FACS General, Bariatric, & Minimally Invasive Surgery [facility]  Gaynelle Adu

## 2018-01-25 NOTE — Anesthesia Preprocedure Evaluation (Addendum)
Anesthesia Evaluation  Patient identified by MRN, date of birth, ID band Patient confused    Reviewed: Allergy & Precautions, Patient's Chart, lab work & pertinent test results, Unable to perform ROS - Chart review only  Airway Mallampati: II  TM Distance: >3 FB Neck ROM: Full    Dental  (+) Teeth Intact, Dental Advisory Given   Pulmonary former smoker,     + decreased breath sounds      Cardiovascular negative cardio ROS   Rhythm:Regular Rate:Normal     Neuro/Psych Dementia    GI/Hepatic negative GI ROS, Neg liver ROS,   Endo/Other  negative endocrine ROS  Renal/GU negative Renal ROS     Musculoskeletal  (+) Arthritis , Osteoarthritis,    Abdominal Normal abdominal exam  (+)   Peds  Hematology negative hematology ROS (+)   Anesthesia Other Findings   Reproductive/Obstetrics                           Anesthesia Physical Anesthesia Plan  ASA: IV  Anesthesia Plan: General   Post-op Pain Management:    Induction: Intravenous  PONV Risk Score and Plan: 4 or greater and Ondansetron and Treatment may vary due to age or medical condition  Airway Management Planned: Oral ETT  Additional Equipment: None  Intra-op Plan:   Post-operative Plan: Extubation in OR  Informed Consent: I have reviewed the patients History and Physical, chart, labs and discussed the procedure including the risks, benefits and alternatives for the proposed anesthesia with the patient or authorized representative who has indicated his/her understanding and acceptance.   Consent reviewed with POA  Plan Discussed with: CRNA  Anesthesia Plan Comments: (2 attempts made to contact Tania Ade (Son) for anesthesia consent. He did not answer. Per consent & RN, he agreed to proceed with sugical management of sacral decubitis ulcer with anesthesia.   DNR will be suspended until patient exits PACU.  )        Anesthesia Quick Evaluation

## 2018-01-25 NOTE — Progress Notes (Signed)
At 1830, report was given to Ginger, RN on the 4th floor. Pt is being transferred to 1417 for cardiac monitoring.  At 1834, The pt's Son, Willy Vorce was notified that the pt is out of surgery and is being transferred to the 4th floor for cardiac monitoring. Joelene Millin reported no further questions or concerns.

## 2018-01-25 NOTE — H&P (View-Only) (Signed)
Reason for Consult:right buttock wound Referring Physician: Kakrakandy  [name] [name] is an 84 y.o. female.  HPI:  Pt is a 84 yo F with history of recent periprosthetic right hip fracture and dementia who presents with increasing confusion and foul smelling right buttock pressure ulcer.  She was brought to the ED and found to have significant leukocytosis and UTI.  Scan showed concerns for necrotizing soft tissue infection.    Pt has been pretty bed bound for quite a while.  Her recent fx was treated non operatively for that reason.    Past Medical History:  Diagnosis Date  . Arthritis   . Cataract   . Glaucoma     Past Surgical History:  Procedure Laterality Date  . EYE SURGERY    . FEMUR IM NAIL Right 08/20/2016   Procedure: INTRAMEDULLARY (IM) NAIL FEMORAL RIGHT HIP;  Surgeon: Swinteck, Brian, MD;  Location: [facility];  Service: Orthopedics;  Laterality: Right;  . HIP ARTHROPLASTY Left 04/13/2016   Procedure: ARTHROPLASTY BIPOLAR HIP (HEMIARTHROPLASTY);  Surgeon: Steve Norris, MD;  Location: [facility];  Service: Orthopedics;  Laterality: Left;  . right hand surgery      Family History  Problem Relation Age of Onset  . Stroke Father   . Stroke Sister   . Anemia Sister   . Lung disease Neg Hx     Social History:  reports that she quit smoking about 21 months ago. Her smoking use included cigarettes. She started smoking about 75 years ago. She smoked 0.50 packs per day. She has never used smokeless tobacco. She reports that she does not drink alcohol or use drugs.  Allergies: No Known Allergies  Medications:  Current Meds  Medication Sig  . acetaminophen (TYLENOL) 650 MG CR tablet Take 650 mg by mouth 3 (three) times daily.  . albuterol (PROVENTIL) (2.5 MG/3ML) 0.083% nebulizer solution Take 2.5 mg by nebulization every 6 (six) hours as needed for wheezing or shortness of breath.  . aspirin 81 MG chewable tablet Chew 81 mg by mouth daily.   . Cholecalciferol (VITAMIN D3) 2000  units capsule Take 2,000 Units by mouth daily.  . cycloSPORINE (RESTASIS) 0.05 % ophthalmic emulsion Place 1 drop into both eyes 2 (two) times daily.  . diclofenac sodium (VOLTAREN) 1 % GEL Apply 4 g topically 2 (two) times daily.  . folic acid (FOLVITE) 1 MG tablet Take 1 mg by mouth daily.   . furosemide (LASIX) 20 MG tablet Take 20 mg by mouth 3 (three) times a week.  . lidocaine (LMX) 4 % cream Apply 1 application topically daily. Apply to left thigh and leg  . LORazepam (ATIVAN) 0.5 MG tablet Take 0.25 mg by mouth 2 (two) times daily as needed for anxiety.  . Melatonin 5 MG TABS Take 5 mg by mouth at bedtime.  . methotrexate (RHEUMATREX) 2.5 MG tablet Take 20 mg by mouth once a week. On Wednesday.  . mirtazapine (REMERON) 15 MG tablet Take 15 mg by mouth at bedtime.  . neomycin-bacitracin-polymyxin (NEOSPORIN) 5-400-5000 ointment Apply 1 application topically daily. Apply to nail bed of affected left toe prior to placing socks in the morning.  . nitrofurantoin, macrocrystal-monohydrate, (MACROBID) 100 MG capsule Take 100 mg by mouth 2 (two) times daily.  . NON FORMULARY Apply 1 application topically 2 (two) times daily. Vitamin K Lotion, Apply to legs  . OLANZapine (ZYPREXA) 2.5 MG tablet Take 1.25 mg by mouth daily.  . olopatadine (PATANOL) 0.1 % ophthalmic solution Place 2 drops   into both eyes daily as needed for allergies.  . ondansetron (ZOFRAN) 4 MG tablet Take 4 mg by mouth every 12 (twelve) hours as needed for nausea or vomiting.  . potassium chloride (K-DUR,KLOR-CON) 10 MEQ tablet Take 10 mEq by mouth 3 (three) times a week.  . traMADol (ULTRAM) 50 MG tablet Take 50 mg by mouth 2 (two) times daily.   . traZODone (DESYREL) 50 MG tablet Take 50 mg by mouth at bedtime.  . trolamine salicylate (ASPERCREME) 10 % cream Apply 1 application topically 2 (two) times daily. Apply from left ankle to hip  . vitamin B-12 (CYANOCOBALAMIN) 1000 MCG tablet Take 1,000 mcg by mouth daily.      Results for orders placed or performed during the hospital encounter of 01/24/18 (from the past 48 hour(s))  Blood gas, venous     Status: Abnormal   Collection Time: 01/24/18  8:38 PM  Result Value Ref Range   FIO2 21.00    pH, Ven 7.448 (H) 7.250 - 7.430   pCO2, Ven 36.0 (L) 44.0 - 60.0 mmHg   pO2, Ven 47.5 (H) 32.0 - 45.0 mmHg   Bicarbonate 24.5 20.0 - 28.0 mmol/L   Acid-Base Excess 1.4 0.0 - 2.0 mmol/L   O2 Saturation 83.2 %   Patient temperature 98.2    Collection site VEIN    Drawn by COLLECTED BY NURSE    Sample type VENOUS     Comment: Performed at [facility], [address] [place], [place] [address]  I-Stat CG4 Lactic Acid, ED  (not at  [facility])     Status: Abnormal   Collection Time: 01/24/18  8:41 PM  Result Value Ref Range   Lactic Acid, Venous 2.29 (HH) 0.5 - 1.9 mmol/L   Comment NOTIFIED PHYSICIAN   Comprehensive metabolic panel     Status: Abnormal   Collection Time: 01/24/18  8:43 PM  Result Value Ref Range   Sodium 145 135 - 145 mmol/L   Potassium 4.7 3.5 - 5.1 mmol/L   Chloride 113 (H) 98 - 111 mmol/L   CO2 23 22 - 32 mmol/L   Glucose, Bld 103 (H) 70 - 99 mg/dL   BUN 60 (H) 8 - 23 mg/dL   Creatinine, Ser 1.12 (H) 0.44 - 1.00 mg/dL   Calcium 8.8 (L) 8.9 - 10.3 mg/dL   Total Protein 6.5 6.5 - 8.1 g/dL   Albumin 2.3 (L) 3.5 - 5.0 g/dL   AST 22 15 - 41 U/L   ALT 17 0 - 44 U/L   Alkaline Phosphatase 106 38 - 126 U/L   Total Bilirubin 0.9 0.3 - 1.2 mg/dL   GFR calc non Af Amer 41 (L) >60 mL/min   GFR calc Af Amer 48 (L) >60 mL/min    Comment: (NOTE) The eGFR has been calculated using the CKD EPI equation. This calculation has not been validated in all clinical situations. eGFR's persistently <60 mL/min signify possible Chronic Kidney Disease.    Anion gap 9 5 - 15    Comment: [facility] [address] [place], [place] [address]  CBC WITH DIFFERENTIAL     Status: Abnormal   Collection Time:  01/24/18  8:43 PM  Result Value Ref Range   WBC 20.4 (H) 4.0 - 10.5 K/uL   RBC 4.35 3.87 - 5.11 MIL/uL   Hemoglobin 13.2 12.0 - 15.0 g/dL   HCT 41.4 36.0 - 46.0 %   MCV 95.2 78.0 - 100.0 fL     MCH 30.3 26.0 - 34.0 pg   MCHC 31.9 30.0 - 36.0 g/dL   RDW 16.9 (H) 11.5 - 15.5 %   Platelets 479 (H) 150 - 400 K/uL   Neutrophils Relative % 94 %   Lymphocytes Relative 4 %   Monocytes Relative 2 %   Eosinophils Relative 0 %   Basophils Relative 0 %   Neutro Abs 19.2 (H) 1.7 - 7.7 K/uL   Lymphs Abs 0.8 0.7 - 4.0 K/uL   Monocytes Absolute 0.4 0.1 - 1.0 K/uL   Eosinophils Absolute 0.0 0.0 - 0.7 K/uL   Basophils Absolute 0.0 0.0 - 0.1 K/uL   Smear Review MORPHOLOGY UNREMARKABLE     Comment: Performed at [facility], [address] [place], [place] [address]  Urinalysis, Routine w reflex microscopic     Status: Abnormal   Collection Time: 01/24/18  8:43 PM  Result Value Ref Range   Color, Urine AMBER (A) YELLOW    Comment: BIOCHEMICALS MAY BE AFFECTED BY COLOR   APPearance CLOUDY (A) CLEAR   Specific Gravity, Urine 1.012 1.005 - 1.030   pH 7.0 5.0 - 8.0   Glucose, UA NEGATIVE NEGATIVE mg/dL   Hgb urine dipstick LARGE (A) NEGATIVE   Bilirubin Urine NEGATIVE NEGATIVE   Ketones, ur NEGATIVE NEGATIVE mg/dL   Protein, ur 30 (A) NEGATIVE mg/dL   Nitrite POSITIVE (A) NEGATIVE   Leukocytes, UA LARGE (A) NEGATIVE   RBC / HPF 6-10 0 - 5 RBC/hpf   WBC, UA >50 (H) 0 - 5 WBC/hpf   Bacteria, UA MANY (A) NONE SEEN   Squamous Epithelial / LPF 0-5 0 - 5   WBC Clumps PRESENT     Comment: Performed at [facility], [address] [place], [place] [address]  I-Stat CG4 Lactic Acid, ED  (not at  [facility])     Status: None   Collection Time: 01/24/18 10:46 PM  Result Value Ref Range   Lactic Acid, Venous 1.82 0.5 - 1.9 mmol/L  MRSA PCR Screening     Status: None   Collection Time: 01/25/18  3:49 AM  Result Value Ref Range   MRSA by PCR NEGATIVE NEGATIVE    Comment:         The GeneXpert MRSA Assay (FDA approved for NASAL specimens only), is one component of a comprehensive MRSA colonization surveillance program. It is not intended to diagnose MRSA infection nor to guide or monitor treatment for MRSA infections. Performed at [facility], [address] [place],  [address]   Comprehensive metabolic panel     Status: Abnormal   Collection Time: 01/25/18  4:09 AM  Result Value Ref Range   Sodium 149 (H) 135 - 145 mmol/L   Potassium 4.2 3.5 - 5.1 mmol/L   Chloride 116 (H) 98 - 111 mmol/L   CO2 27 22 - 32 mmol/L   Glucose, Bld 118 (H) 70 - 99 mg/dL   BUN 54 (H) 8 - 23 mg/dL   Creatinine, Ser 1.11 (H) 0.44 - 1.00 mg/dL   Calcium 8.3 (L) 8.9 - 10.3 mg/dL   Total Protein 5.6 (L) 6.5 - 8.1 g/dL   Albumin 2.0 (L) 3.5 - 5.0 g/dL   AST 19 15 - 41 U/L   ALT 15 0 - 44 U/L   Alkaline Phosphatase 97 38 - 126 U/L   Total Bilirubin 0.6 0.3 - 1.2 mg/dL   GFR calc non Af Amer 41 (L) >60 mL/min     GFR calc Af Amer 48 (L) >60 mL/min    Comment: (NOTE) The eGFR has been calculated using the CKD EPI equation. This calculation has not been validated in all clinical situations. eGFR's persistently <60 mL/min signify possible Chronic Kidney Disease.    Anion gap 6 5 - 15    Comment: Performed at [facility], [address] [place], [place] [address]  CBC WITH DIFFERENTIAL     Status: Abnormal   Collection Time: 01/25/18  4:09 AM  Result Value Ref Range   WBC 30.1 (H) 4.0 - 10.5 K/uL    Comment: REPEATED TO VERIFY WHITE COUNT CONFIRMED ON SMEAR    RBC 3.92 3.87 - 5.11 MIL/uL   Hemoglobin 11.7 (L) 12.0 - 15.0 g/dL   HCT 38.0 36.0 - 46.0 %   MCV 96.9 78.0 - 100.0 fL   MCH 29.8 26.0 - 34.0 pg   MCHC 30.8 30.0 - 36.0 g/dL   RDW 17.1 (H) 11.5 - 15.5 %   Platelets 405 (H) 150 - 400 K/uL    Comment: SPECIMEN CHECKED FOR CLOTS REPEATED TO VERIFY PLATELET COUNT CONFIRMED BY SMEAR    Neutrophils Relative % 93 %    Lymphocytes Relative 4 %   Monocytes Relative 3 %   Eosinophils Relative 0 %   Basophils Relative 0 %   Neutro Abs 28.0 (H) 1.7 - 7.7 K/uL   Lymphs Abs 1.2 0.7 - 4.0 K/uL   Monocytes Absolute 0.9 0.1 - 1.0 K/uL   Eosinophils Absolute 0.0 0.0 - 0.7 K/uL   Basophils Absolute 0.0 0.0 - 0.1 K/uL   WBC Morphology DOHLE BODIES     Comment: Performed at [facility], [address] [place], [place] [address]    Ct Pelvis W Contrast  Result Date: 01/25/2018 CLINICAL DATA:  Infection, sepsis, urinary tract infection, pressure ulcer. EXAM: CT PELVIS WITH CONTRAST TECHNIQUE: Multidetector CT imaging of the pelvis was performed using the standard protocol following the bolus administration of intravenous contrast. CONTRAST:  75mL OMNIPAQUE IOHEXOL 300 MG/ML  SOLN COMPARISON:  Pelvic radiograph August 20, 2016 FINDINGS: Urinary Tract:  Decompressed by Foley catheter. Bowel: RIGHT lower quadrant small bowel wall thickening and hyperenhancement. Included small and large bowel are normal in course and caliber. Vascular/Lymphatic: Severe calcific atherosclerosis. Mildly ectatic infrarenal aorta. No lymphadenopathy. Reproductive: Uterus not discretely identified, potentially obscured by hardware artifact. Other: 7.5 x 7.7 x 6.3 cm focal fluid or cyst without rim enhancement in presacral fat. Musculoskeletal: RIGHT gluteal 2 proximal femoral subcutaneous fat stranding with subcutaneous gas contiguous with ischial tuberosity which is intact. Effaced RIGHT hamstring fat planes and suspected fluid collection. Focal cortical dehiscence medial proximal femur (series 4, image 126) status post LEFT hip hemiarthroplasty and RIGHT femoral neck pinning with intramedullary rod. Osteopenia. IMPRESSION: 1. RIGHT gluteal necrotizing fasciitis with subcutaneous gas and myositis. Possible RIGHT hamstring intramuscular abscess though limited by hardware artifact. 2. Focal cortical dehiscence proximal RIGHT femur  most compatible with osteomyelitis. 3. Partially imaged enteritis without bowel obstruction. 4. Nonspecific 7.5 x 7.7 x 6.3 cm cyst or fluid collection in pelvis. Electronically Signed   By: Courtnay  Bloomer M.D.   On: 01/25/2018 02:57   Dg Chest Portable 1 View  Result Date: 01/24/2018 CLINICAL DATA:  Altered level of consciousness EXAM: PORTABLE CHEST 1 VIEW COMPARISON:  07/04/2017 FINDINGS: No focal opacity or pleural effusion. Cardiomediastinal silhouette within normal limits. Aortic atherosclerosis. No pneumothorax. Subacute to old left fourth rib fracture. IMPRESSION: No active disease.   Electronically Signed   By: Kim  Fujinaga M.D.   On: 01/24/2018 20:27    Review of Systems  Unable to perform ROS: Dementia   Blood pressure 103/65, pulse 91, temperature (!) 97.5 F (36.4 C), temperature source Oral, resp. rate 16, SpO2 100 %. Physical Exam  Constitutional: She appears well-developed. She appears distressed.  Very thin  HENT:  Head: Normocephalic and atraumatic.  Mouth/Throat: Oropharynx is clear and moist.  Eyes: Pupils are equal, round, and reactive to light. Conjunctivae are normal. Right eye exhibits no discharge. Left eye exhibits no discharge. No scleral icterus.  Neck: Neck supple.  Cardiovascular: Normal rate, regular rhythm and intact distal pulses.  Respiratory: Effort normal. No respiratory distress.  GI: Soft. She exhibits no distension. There is no tenderness. There is no rebound and no guarding.  Musculoskeletal: She exhibits no tenderness.  Some hand contractures.  Stiff lower extremities.    Neurological:  Not very alert, but able to arouse and answer yes/no questions appropriately  Skin: Skin is warm and dry. No rash noted. She is not diaphoretic. No erythema. There is pallor.     Eschar over right ischium with foul smell.  Packing in place.  Necrotic tissue.    Assessment/Plan: Infected decubitus ulcer over right ischium. Not consistent with necrotizing  fasciitis.  Air in wound likely secondary to communication with skin.   Dementia Recent right hip fracture UTI Encephalopathy due to infection  Plan operative debridement later today. NPO IV antibiotics.     Chrysta Fulcher 01/25/2018, 7:13 AM     

## 2018-01-25 NOTE — Progress Notes (Signed)
MEDICATION RELATED CONSULT NOTE - INITIAL   Pharmacy Consult for Methotrexate Indication: RA  No Known Allergies  Patient Measurements:   Adjusted Body Weight:   Vital Signs: Temp: 97.5 F (36.4 C) (09/29 0203) Temp Source: Oral (09/29 0203) BP: 103/65 (09/29 0203) Pulse Rate: 91 (09/29 0203) Intake/Output from previous day: 09/28 0701 - 09/29 0700 In: 1381.1 [I.V.:416.1; IV Piggyback:965] Out: -  Intake/Output from this shift: Total I/O In: 1381.1 [I.V.:416.1; IV Piggyback:965] Out: -   Labs: Recent Labs    01/24/18 2043  WBC 20.4*  HGB 13.2  HCT 41.4  PLT 479*  CREATININE 1.12*  ALBUMIN 2.3*  PROT 6.5  AST 22  ALT 17  ALKPHOS 106  BILITOT 0.9   CrCl cannot be calculated (Unknown ideal weight.).   Microbiology: No results found for this or any previous visit (from the past 720 hour(s)).  Medical History: Past Medical History:  Diagnosis Date  . Arthritis   . Cataract   . Glaucoma     Medications:  Medications Prior to Admission  Medication Sig Dispense Refill Last Dose  . acetaminophen (TYLENOL) 650 MG CR tablet Take 650 mg by mouth 3 (three) times daily.   01/24/2018 at 0800  . albuterol (PROVENTIL) (2.5 MG/3ML) 0.083% nebulizer solution Take 2.5 mg by nebulization every 6 (six) hours as needed for wheezing or shortness of breath.   unk  . aspirin 81 MG chewable tablet Chew 81 mg by mouth daily.    01/24/2018 at 0800  . Cholecalciferol (VITAMIN D3) 2000 units capsule Take 2,000 Units by mouth daily.   01/24/2018 at 0800  . cycloSPORINE (RESTASIS) 0.05 % ophthalmic emulsion Place 1 drop into both eyes 2 (two) times daily.   01/24/2018 at 0800  . diclofenac sodium (VOLTAREN) 1 % GEL Apply 4 g topically 2 (two) times daily.   01/24/2018 at 0800  . folic acid (FOLVITE) 1 MG tablet Take 1 mg by mouth daily.    01/24/2018 at 0800  . furosemide (LASIX) 20 MG tablet Take 20 mg by mouth 3 (three) times a week.   01/23/2018 at 0800  . lidocaine (LMX) 4 % cream  Apply 1 application topically daily. Apply to left thigh and leg   01/23/2018 at Unknown time  . LORazepam (ATIVAN) 0.5 MG tablet Take 0.25 mg by mouth 2 (two) times daily as needed for anxiety.   Past Month at Unknown time  . Melatonin 5 MG TABS Take 5 mg by mouth at bedtime.   01/23/2018 at 2000  . methotrexate (RHEUMATREX) 2.5 MG tablet Take 20 mg by mouth once a week. On Wednesday.   01/21/2018  . mirtazapine (REMERON) 15 MG tablet Take 15 mg by mouth at bedtime.   01/23/2018 at 2000  . neomycin-bacitracin-polymyxin (NEOSPORIN) [phone] ointment Apply 1 application topically daily. Apply to nail bed of affected left toe prior to placing socks in the morning.   01/24/2018 at 0600  . nitrofurantoin, macrocrystal-monohydrate, (MACROBID) 100 MG capsule Take 100 mg by mouth 2 (two) times daily.   01/24/2018 at 0800  . NON FORMULARY Apply 1 application topically 2 (two) times daily. Vitamin K Lotion, Apply to legs   01/24/2018 at 0600  . OLANZapine (ZYPREXA) 2.5 MG tablet Take 1.25 mg by mouth daily.   01/23/2018 at 1700  . olopatadine (PATANOL) 0.1 % ophthalmic solution Place 2 drops into both eyes daily as needed for allergies.   unk  . ondansetron (ZOFRAN) 4 MG tablet Take 4 mg by mouth every  12 (twelve) hours as needed for nausea or vomiting.   unk  . potassium chloride (K-DUR,KLOR-CON) 10 MEQ tablet Take 10 mEq by mouth 3 (three) times a week.   01/23/2018 at 0800  . traMADol (ULTRAM) 50 MG tablet Take 50 mg by mouth 2 (two) times daily.    01/24/2018 at 0800  . traZODone (DESYREL) 50 MG tablet Take 50 mg by mouth at bedtime.   01/23/2018 at 2000  . trolamine salicylate (ASPERCREME) 10 % cream Apply 1 application topically 2 (two) times daily. Apply from left ankle to hip   01/24/2018 at 0600  . vitamin B-12 (CYANOCOBALAMIN) 1000 MCG tablet Take 1,000 mcg by mouth daily.   01/24/2018 at 0800  . enoxaparin (LOVENOX) 30 MG/0.3ML injection Inject 0.3 mLs (30 mg total) into the skin daily. (Patient not taking:  Reported on 09/02/2016) 28 Syringe 0 Not Taking at Unknown time  . polyethylene glycol (MIRALAX / GLYCOLAX) packet Take 17 g by mouth daily as needed for mild constipation. (Patient not taking: Reported on 07/04/2017) 14 each 0 Not Taking at Unknown time  . senna (SENOKOT) 8.6 MG TABS tablet Take 1 tablet (8.6 mg total) by mouth 2 (two) times daily. (Patient not taking: Reported on 07/04/2017) 20 each 0 Not Taking at Unknown time   Scheduled:  . cycloSPORINE  1 drop Both Eyes BID  . [START ON 01/26/2018] Influenza vac split quadrivalent PF  0.5 mL Intramuscular Tomorrow-1000    Assessment: Methotrexate (Trexall; Rheumatrex) hold criteria  Hgb < 8  WBC < 3  Pltc < 100K  SCr > 1.5x baseline (or > 2 if baseline unknown)  AST or ALT >3x ULN  Bili > 1.5x ULN  Ascites or pleural effusion  Diarrhea - Grade 2 or higher  Ulcerative stomatitis  Unexplained pneumonitis / hypoxemia  Active infection   Goal of Therapy:  Safe and effective use of Methotrexate  Plan:  D/c med at this time.  Darlina Guys, Jacquenette Shone Crowford 01/25/2018,4:28 AM

## 2018-01-25 NOTE — Anesthesia Postprocedure Evaluation (Signed)
Anesthesia Post Note  Patient: TYNIKA LUDDY  Procedure(s) Performed: IRRIGATION AND DEBRIDEMENT ABSCESS- SACRAL DECUBITUS (Right Buttocks)     Patient location during evaluation: PACU Anesthesia Type: General Level of consciousness: awake and alert Pain management: pain level controlled Vital Signs Assessment: post-procedure vital signs reviewed and stable Respiratory status: spontaneous breathing, nonlabored ventilation, respiratory function stable and patient connected to nasal cannula oxygen Cardiovascular status: blood pressure returned to baseline and stable Postop Assessment: no apparent nausea or vomiting Anesthetic complications: no    Last Vitals:  Vitals:   01/25/18 1630 01/25/18 1650  BP: (!) 105/53 102/61  Pulse: 94 96  Resp: 12   Temp: 36.8 C (!) 36.4 C  SpO2: 96% 100%    Last Pain:  Vitals:   01/25/18 1650  TempSrc: Axillary  PainSc:                  Shelton Silvas

## 2018-01-25 NOTE — Progress Notes (Signed)
Pharmacy Antibiotic Note  [name] [name] is a 84 y.o. female admitted on 01/24/2018 with sepsis, UTI, Infected decubitus ulcer over right ischium, NOT likely necrotizing facsiitis.  [facility] is planning for operative debridement on 9/29.  Pharmacy has been consulted for Vancomycin and Zosyn dosing.  SCr 1.1 WBC 30.1 Afebrile Weight - Last documented weight 44.5 kg (01/03/18).  Currently unable to stand for weight; RN/NT unable to obtain bed weight on 2 different beds.  One bed showed weight of 30 kg, but was unable to be repeated or confirmed.  Plan:  Zosyn 3.375g IV Q8H infused over 4hrs.  Vancomycin 1g IV x1 dose, then 500 mg IV q48h.  (SCr 1.11, Wt 44 kg, TBW/TBW, est AUC 386, est VT 9)  Check vancomycin levels if remains on vancomycin > 3-4 days.  Goal AUC 400-500.  Follow up renal fxn, culture results, and clinical course.  F/u ability to de-escalate antibiotics.     Temp (24hrs), Avg:97.9 F (36.6 C), Min:97.5 F (36.4 C), Max:98.2 F (36.8 C)  Recent Labs  Lab 01/24/18 2041 01/24/18 2043 01/24/18 2246 01/25/18 0409  WBC  --  20.4*  --  30.1*  CREATININE  --  1.12*  --  1.11*  LATICACIDVEN 2.29*  --  1.82  --     CrCl cannot be calculated (Unknown ideal weight.).    Using most recent weight of 44 kg, SCr 1.11, CrCl ~ 21.7 ml/min   No Known Allergies  Antimicrobials this admission: 9/28 Ceftriaxone x1 dose 9/28 Vancomycin >>  9/29 Cefepime >> 9/29 9/29 Zosyn >>   Dose adjustments this admission:   Microbiology results: 9/28 BCx:  9/28 UCx:   9/29 MRSA PCR: negative  Thank you for allowing pharmacy to be a part of this patient's care.  Lynann Beaver PharmD, BCPS Pager [phone] 01/25/2018 9:11 AM

## 2018-01-25 NOTE — Progress Notes (Signed)
Pt has hx of dementia and was schedule for surgery later today. I called her son, Caylynn Minchew, who reported speaking to the Dr. Already this morning regarding the surgery. The son reported no further questions or concerns and provided verbal informed consent for the pt to have an I and D of infected Decubitus, as well as a blood consent, if needed. Martyn Ehrich, RN witness and confirmed the son understood the procedure and informed consent.

## 2018-01-25 NOTE — Progress Notes (Signed)
Patient was admitted early this morning after midnight and H&P has been reviewed and I am in current agreement with assessment and plan done by Dr. Midge Minium. Additional plan of care been made accordingly.  The patient is a elderly thin frail and cachectic appearing Caucasian female with a past medical history significant for rheumatoid arthritis, dementia, cataracts, glaucoma, who is essentially bedbound from a skilled nursing facility [facility] who presents with increasingly worse confusion and poor p.o. intake for last 3 days.  Patient had a fall 6 weeks ago when she had a periprosthetic hip fracture and was managed conservatively and subsequently developed sacral decubitus ulcers on her buttock.  She is admitted for a suspected UTI however the right buttock ulcer looked infected and a CT of the abdomen pelvis was done and it showed concern for necrotizing fasciitis with a right hamstring intramuscular abscess.  General surgery was consulted and did not feel that she had true necrotizing fasciitis however nonetheless there do not take the patient to the OR for debridement.  She also had a focal cortical dehiscence of the right proximal femur most compatible with osteomyelitis.  Infectious disease was consulted for further evaluation recommendations and Dr. Ilsa Iha recommending changing the patient from IV cefepime and IV Flagyl to IV Zosyn and continue IV vancomycin.  Patient is to go down for a operative debridement later today and general surgery is recommended patient be n.p.o. continue IV antibiotics.  We will continue monitor patient's clinical course and response to intervention and repeat blood work in the a.m.

## 2018-01-25 NOTE — Consult Note (Signed)
Reason for Consult:right buttock wound Referring Physician: [name] is an 84 y.o. female.  HPI:  Pt is a 84 yo F with history of recent periprosthetic right hip fracture and dementia who presents with increasing confusion and foul smelling right buttock pressure ulcer.  She was brought to the ED and found to have significant leukocytosis and UTI.  Scan showed concerns for necrotizing soft tissue infection.    Pt has been pretty bed bound for quite a while.  Her recent fx was treated non operatively for that reason.    Past Medical History:  Diagnosis Date  . Arthritis   . Cataract   . Glaucoma     Past Surgical History:  Procedure Laterality Date  . EYE SURGERY    . FEMUR IM NAIL Right 08/20/2016   Procedure: INTRAMEDULLARY (IM) NAIL FEMORAL RIGHT HIP;  Surgeon: Rod Can, MD;  Location: [facility];  Service: Orthopedics;  Laterality: Right;  . HIP ARTHROPLASTY Left 04/13/2016   Procedure: ARTHROPLASTY BIPOLAR HIP (HEMIARTHROPLASTY);  Surgeon: Netta Cedars, MD;  Location: [facility];  Service: Orthopedics;  Laterality: Left;  . right hand surgery      Family History  Problem Relation Age of Onset  . Stroke Father   . Stroke Sister   . Anemia Sister   . Lung disease Neg Hx     Social History:  reports that she quit smoking about 21 months ago. Her smoking use included cigarettes. She started smoking about 75 years ago. She smoked 0.50 packs per day. She has never used smokeless tobacco. She reports that she does not drink alcohol or use drugs.  Allergies: No Known Allergies  Medications:  Current Meds  Medication Sig  . acetaminophen (TYLENOL) 650 MG CR tablet Take 650 mg by mouth 3 (three) times daily.  Marland Kitchen albuterol (PROVENTIL) (2.5 MG/3ML) 0.083% nebulizer solution Take 2.5 mg by nebulization every 6 (six) hours as needed for wheezing or shortness of breath.  Marland Kitchen aspirin 81 MG chewable tablet Chew 81 mg by mouth daily.   . Cholecalciferol (VITAMIN D3) 2000  units capsule Take 2,000 Units by mouth daily.  . cycloSPORINE (RESTASIS) 0.05 % ophthalmic emulsion Place 1 drop into both eyes 2 (two) times daily.  . diclofenac sodium (VOLTAREN) 1 % GEL Apply 4 g topically 2 (two) times daily.  . folic acid (FOLVITE) 1 MG tablet Take 1 mg by mouth daily.   . furosemide (LASIX) 20 MG tablet Take 20 mg by mouth 3 (three) times a week.  . lidocaine (LMX) 4 % cream Apply 1 application topically daily. Apply to left thigh and leg  . LORazepam (ATIVAN) 0.5 MG tablet Take 0.25 mg by mouth 2 (two) times daily as needed for anxiety.  . Melatonin 5 MG TABS Take 5 mg by mouth at bedtime.  . methotrexate (RHEUMATREX) 2.5 MG tablet Take 20 mg by mouth once a week. On Wednesday.  . mirtazapine (REMERON) 15 MG tablet Take 15 mg by mouth at bedtime.  Marland Kitchen neomycin-bacitracin-polymyxin (NEOSPORIN) [phone] ointment Apply 1 application topically daily. Apply to nail bed of affected left toe prior to placing socks in the morning.  . nitrofurantoin, macrocrystal-monohydrate, (MACROBID) 100 MG capsule Take 100 mg by mouth 2 (two) times daily.  . NON FORMULARY Apply 1 application topically 2 (two) times daily. Vitamin K Lotion, Apply to legs  . OLANZapine (ZYPREXA) 2.5 MG tablet Take 1.25 mg by mouth daily.  Marland Kitchen olopatadine (PATANOL) 0.1 % ophthalmic solution Place 2 drops  into both eyes daily as needed for allergies.  Marland Kitchen ondansetron (ZOFRAN) 4 MG tablet Take 4 mg by mouth every 12 (twelve) hours as needed for nausea or vomiting.  . potassium chloride (K-DUR,KLOR-CON) 10 MEQ tablet Take 10 mEq by mouth 3 (three) times a week.  . traMADol (ULTRAM) 50 MG tablet Take 50 mg by mouth 2 (two) times daily.   . traZODone (DESYREL) 50 MG tablet Take 50 mg by mouth at bedtime.  . trolamine salicylate (ASPERCREME) 10 % cream Apply 1 application topically 2 (two) times daily. Apply from left ankle to hip  . vitamin B-12 (CYANOCOBALAMIN) 1000 MCG tablet Take 1,000 mcg by mouth daily.      Results for orders placed or performed during the hospital encounter of 01/24/18 (from the past 48 hour(s))  Blood gas, venous     Status: Abnormal   Collection Time: 01/24/18  8:38 PM  Result Value Ref Range   FIO2 21.00    pH, Ven 7.448 (H) 7.250 - 7.430   pCO2, Ven 36.0 (L) 44.0 - 60.0 mmHg   pO2, Ven 47.5 (H) 32.0 - 45.0 mmHg   Bicarbonate 24.5 20.0 - 28.0 mmol/L   Acid-Base Excess 1.4 0.0 - 2.0 mmol/L   O2 Saturation 83.2 %   Patient temperature 98.2    Collection site VEIN    Drawn by COLLECTED BY NURSE    Sample type VENOUS     Comment: Performed at [facility], [place] [address], [place], [place] [address]  I-Stat CG4 Lactic Acid, ED  (not at  [facility])     Status: Abnormal   Collection Time: 01/24/18  8:41 PM  Result Value Ref Range   Lactic Acid, Venous 2.29 (HH) 0.5 - 1.9 mmol/L   Comment NOTIFIED PHYSICIAN   Comprehensive metabolic panel     Status: Abnormal   Collection Time: 01/24/18  8:43 PM  Result Value Ref Range   Sodium 145 135 - 145 mmol/L   Potassium 4.7 3.5 - 5.1 mmol/L   Chloride 113 (H) 98 - 111 mmol/L   CO2 23 22 - 32 mmol/L   Glucose, Bld 103 (H) 70 - 99 mg/dL   BUN 60 (H) 8 - 23 mg/dL   Creatinine, Ser 1.12 (H) 0.44 - 1.00 mg/dL   Calcium 8.8 (L) 8.9 - 10.3 mg/dL   Total Protein 6.5 6.5 - 8.1 g/dL   Albumin 2.3 (L) 3.5 - 5.0 g/dL   AST 22 15 - 41 U/L   ALT 17 0 - 44 U/L   Alkaline Phosphatase 106 38 - 126 U/L   Total Bilirubin 0.9 0.3 - 1.2 mg/dL   GFR calc non Af Amer 41 (L) >60 mL/min   GFR calc Af Amer 48 (L) >60 mL/min    Comment: (NOTE) The eGFR has been calculated using the CKD EPI equation. This calculation has not been validated in all clinical situations. eGFR's persistently <60 mL/min signify possible Chronic Kidney Disease.    Anion gap 9 5 - 15    Comment: Performed at [facility], [place] [address] [place], [place] [address]  CBC WITH DIFFERENTIAL     Status: Abnormal   Collection Time:  01/24/18  8:43 PM  Result Value Ref Range   WBC 20.4 (H) 4.0 - 10.5 K/uL   RBC 4.35 3.87 - 5.11 MIL/uL   Hemoglobin 13.2 12.0 - 15.0 g/dL   HCT 41.4 36.0 - 46.0 %   MCV 95.2 78.0 - 100.0 fL  MCH 30.3 26.0 - 34.0 pg   MCHC 31.9 30.0 - 36.0 g/dL   RDW 16.9 (H) 11.5 - 15.5 %   Platelets 479 (H) 150 - 400 K/uL   Neutrophils Relative % 94 %   Lymphocytes Relative 4 %   Monocytes Relative 2 %   Eosinophils Relative 0 %   Basophils Relative 0 %   Neutro Abs 19.2 (H) 1.7 - 7.7 K/uL   Lymphs Abs 0.8 0.7 - 4.0 K/uL   Monocytes Absolute 0.4 0.1 - 1.0 K/uL   Eosinophils Absolute 0.0 0.0 - 0.7 K/uL   Basophils Absolute 0.0 0.0 - 0.1 K/uL   Smear Review MORPHOLOGY UNREMARKABLE     Comment: Performed at [facility], [place] [address], [place], [place] [address]  Urinalysis, Routine w reflex microscopic     Status: Abnormal   Collection Time: 01/24/18  8:43 PM  Result Value Ref Range   Color, Urine AMBER (A) YELLOW    Comment: BIOCHEMICALS MAY BE AFFECTED BY COLOR   APPearance CLOUDY (A) CLEAR   Specific Gravity, Urine 1.012 1.005 - 1.030   pH 7.0 5.0 - 8.0   Glucose, UA NEGATIVE NEGATIVE mg/dL   Hgb urine dipstick LARGE (A) NEGATIVE   Bilirubin Urine NEGATIVE NEGATIVE   Ketones, ur NEGATIVE NEGATIVE mg/dL   Protein, ur 30 (A) NEGATIVE mg/dL   Nitrite POSITIVE (A) NEGATIVE   Leukocytes, UA LARGE (A) NEGATIVE   RBC / HPF 6-10 0 - 5 RBC/hpf   WBC, UA >50 (H) 0 - 5 WBC/hpf   Bacteria, UA MANY (A) NONE SEEN   Squamous Epithelial / LPF 0-5 0 - 5   WBC Clumps PRESENT     Comment: Performed at [facility], [place] [address] [place], [place] [address]  I-Stat CG4 Lactic Acid, ED  (not at  [facility])     Status: None   Collection Time: 01/24/18 10:46 PM  Result Value Ref Range   Lactic Acid, Venous 1.82 0.5 - 1.9 mmol/L  MRSA PCR Screening     Status: None   Collection Time: 01/25/18  3:49 AM  Result Value Ref Range   MRSA by PCR NEGATIVE NEGATIVE    Comment:         The GeneXpert MRSA Assay (FDA approved for NASAL specimens only), is one component of a comprehensive MRSA colonization surveillance program. It is not intended to diagnose MRSA infection nor to guide or monitor treatment for MRSA infections. Performed at [facility], [place] [address], [place],  [address]   Comprehensive metabolic panel     Status: Abnormal   Collection Time: 01/25/18  4:09 AM  Result Value Ref Range   Sodium 149 (H) 135 - 145 mmol/L   Potassium 4.2 3.5 - 5.1 mmol/L   Chloride 116 (H) 98 - 111 mmol/L   CO2 27 22 - 32 mmol/L   Glucose, Bld 118 (H) 70 - 99 mg/dL   BUN 54 (H) 8 - 23 mg/dL   Creatinine, Ser 1.11 (H) 0.44 - 1.00 mg/dL   Calcium 8.3 (L) 8.9 - 10.3 mg/dL   Total Protein 5.6 (L) 6.5 - 8.1 g/dL   Albumin 2.0 (L) 3.5 - 5.0 g/dL   AST 19 15 - 41 U/L   ALT 15 0 - 44 U/L   Alkaline Phosphatase 97 38 - 126 U/L   Total Bilirubin 0.6 0.3 - 1.2 mg/dL   GFR calc non Af Amer 41 (L) >60 mL/min  GFR calc Af Amer 48 (L) >60 mL/min    Comment: (NOTE) The eGFR has been calculated using the CKD EPI equation. This calculation has not been validated in all clinical situations. eGFR's persistently <60 mL/min signify possible Chronic Kidney Disease.    Anion gap 6 5 - 15    Comment: Performed at [facility], [place] [address] [place], [place] [address]  CBC WITH DIFFERENTIAL     Status: Abnormal   Collection Time: 01/25/18  4:09 AM  Result Value Ref Range   WBC 30.1 (H) 4.0 - 10.5 K/uL    Comment: REPEATED TO VERIFY WHITE COUNT CONFIRMED ON SMEAR    RBC 3.92 3.87 - 5.11 MIL/uL   Hemoglobin 11.7 (L) 12.0 - 15.0 g/dL   HCT 38.0 36.0 - 46.0 %   MCV 96.9 78.0 - 100.0 fL   MCH 29.8 26.0 - 34.0 pg   MCHC 30.8 30.0 - 36.0 g/dL   RDW 17.1 (H) 11.5 - 15.5 %   Platelets 405 (H) 150 - 400 K/uL    Comment: SPECIMEN CHECKED FOR CLOTS REPEATED TO VERIFY PLATELET COUNT CONFIRMED BY SMEAR    Neutrophils Relative % 93 %    Lymphocytes Relative 4 %   Monocytes Relative 3 %   Eosinophils Relative 0 %   Basophils Relative 0 %   Neutro Abs 28.0 (H) 1.7 - 7.7 K/uL   Lymphs Abs 1.2 0.7 - 4.0 K/uL   Monocytes Absolute 0.9 0.1 - 1.0 K/uL   Eosinophils Absolute 0.0 0.0 - 0.7 K/uL   Basophils Absolute 0.0 0.0 - 0.1 K/uL   WBC Morphology DOHLE BODIES     Comment: Performed at [facility], [place] [address], [place], [place] [address]    Ct Pelvis W Contrast  Result Date: 01/25/2018 CLINICAL DATA:  Infection, sepsis, urinary tract infection, pressure ulcer. EXAM: CT PELVIS WITH CONTRAST TECHNIQUE: Multidetector CT imaging of the pelvis was performed using the standard protocol following the bolus administration of intravenous contrast. CONTRAST:  83m OMNIPAQUE IOHEXOL 300 MG/ML  SOLN COMPARISON:  Pelvic radiograph August 20, 2016 FINDINGS: Urinary Tract:  Decompressed by Foley catheter. Bowel: RIGHT lower quadrant small bowel wall thickening and hyperenhancement. Included small and large bowel are normal in course and caliber. Vascular/Lymphatic: Severe calcific atherosclerosis. Mildly ectatic infrarenal aorta. No lymphadenopathy. Reproductive: Uterus not discretely identified, potentially obscured by hardware artifact. Other: 7.5 x 7.7 x 6.3 cm focal fluid or cyst without rim enhancement in presacral fat. Musculoskeletal: RIGHT gluteal 2 proximal femoral subcutaneous fat stranding with subcutaneous gas contiguous with ischial tuberosity which is intact. Effaced RIGHT hamstring fat planes and suspected fluid collection. Focal cortical dehiscence medial proximal femur (series 4, image 126) status post LEFT hip hemiarthroplasty and RIGHT femoral neck pinning with intramedullary rod. Osteopenia. IMPRESSION: 1. RIGHT gluteal necrotizing fasciitis with subcutaneous gas and myositis. Possible RIGHT hamstring intramuscular abscess though limited by hardware artifact. 2. Focal cortical dehiscence proximal RIGHT femur  most compatible with osteomyelitis. 3. Partially imaged enteritis without bowel obstruction. 4. Nonspecific 7.5 x 7.7 x 6.3 cm cyst or fluid collection in pelvis. Electronically Signed   By: CElon AlasM.D.   On: 01/25/2018 02:57   Dg Chest Portable 1 View  Result Date: 01/24/2018 CLINICAL DATA:  Altered level of consciousness EXAM: PORTABLE CHEST 1 VIEW COMPARISON:  07/04/2017 FINDINGS: No focal opacity or pleural effusion. Cardiomediastinal silhouette within normal limits. Aortic atherosclerosis. No pneumothorax. Subacute to old left fourth rib fracture. IMPRESSION: No active disease.  Electronically Signed   By: Donavan Foil M.D.   On: 01/24/2018 20:27    Review of Systems  Unable to perform ROS: Dementia   Blood pressure 103/65, pulse 91, temperature (!) 97.5 F (36.4 C), temperature source Oral, resp. rate 16, SpO2 100 %. Physical Exam  Constitutional: She appears well-developed. She appears distressed.  Very thin  HENT:  Head: Normocephalic and atraumatic.  Mouth/Throat: Oropharynx is clear and moist.  Eyes: Pupils are equal, round, and reactive to light. Conjunctivae are normal. Right eye exhibits no discharge. Left eye exhibits no discharge. No scleral icterus.  Neck: Neck supple.  Cardiovascular: Normal rate, regular rhythm and intact distal pulses.  Respiratory: Effort normal. No respiratory distress.  GI: Soft. She exhibits no distension. There is no tenderness. There is no rebound and no guarding.  Musculoskeletal: She exhibits no tenderness.  Some hand contractures.  Stiff lower extremities.    Neurological:  Not very alert, but able to arouse and answer yes/no questions appropriately  Skin: Skin is warm and dry. No rash noted. She is not diaphoretic. No erythema. There is pallor.     Eschar over right ischium with foul smell.  Packing in place.  Necrotic tissue.    Assessment/Plan: Infected decubitus ulcer over right ischium. Not consistent with necrotizing  fasciitis.  Air in wound likely secondary to communication with skin.   Dementia Recent right hip fracture UTI Encephalopathy due to infection  Plan operative debridement later today. NPO IV antibiotics.     Stark Klein 01/25/2018, 7:13 AM

## 2018-01-25 NOTE — Op Note (Signed)
01/24/2018 - 01/25/2018  4:08 PM  PATIENT:  [name] [name]  84 y.o. female  PRE-OPERATIVE DIAGNOSIS:  decubitus-lateral right side  POST-OPERATIVE DIAGNOSIS:  right ischial decubitus  PROCEDURE:  Procedure(s): EXCISIONAL DEBRIDEMENT OF SKIN, SOFT TISSUE, & MUSCLE OF RIGHT ISCHIAL DECUBITUS WITH SCALPEL PULSATILE LAVAGE OF WOUND  SURGEON:  Surgeon(s): Gaynelle Adu, MD  ASSISTANTS: Garner Nash PA-S   ANESTHESIA:   general  DRAINS: Urinary Catheter (Foley)   LOCAL MEDICATIONS USED:  NONE  SPECIMEN:  Source of Specimen:  right ischial decub  DISPOSITION OF SPECIMEN:  micro  COUNTS:  YES  INDICATION FOR PROCEDURE: 84 year old female with recent periprosthetic right hip fracture and dementia who was brought from nursing home with increasing confusion and foul-smelling right buttock pressure ulcer.  She was found to have a significant leukocytosis and urinary tract infection.  On physical exam she was found to have a large infected decubitus over her right ischium.  It was recommended that she undergo excisional debridement in the operating room.  This was discussed with the son who has power of attorney.  We discussed risk and benefits including but not limited to bleeding, infection, need for additional procedures, blood clot formation, scarring, failure to heal, perioperative cardiac and pulmonary events and death  PROCEDURE: After obtaining informed consent from the signed the patient was taken to operating room 1 at [facility].  General endotracheal anesthesia was established she was then placed on the operating table in the left lateral position with the appropriate padding.  She was on scheduled therapeutic antibiotics.  Sequential compression devices were placed.  She had an eschar over her right ischium with foul-smelling drainage and necrotic tissue.  A surgical timeout was performed.  The area of eschar measured 5 cm x 4 cm.  There was a surrounding area of cellulitis  extending up the buttock.  The entire area of cellulitis incorporating the area of eschar measured 6 x 8 cm.  We sharply incised the eschar with a scalpel and debrided and the skin and soft tissue sharply.  Cultures were obtained.  We debrided the skin soft tissue and down to viable muscle we ended up having to take additional skin due to significant undermining and extension of necrotic tissue up the buttock.  We then performed pulsatile lavage with 3 L of saline.  Hemostasis was achieved with electrocautery.  The wound measured 8 cm x 7 cm x 3 cm deep at the conclusion.  The decubitus almost went down to the bone.  Moist Kerlix was placed into the wound bed covered by dry gauze and ABDs followed by mesh underwear.  The patient was placed in supine position and extubated and brought to the recovery room.  All needle, instrument and sponge counts were correct x2.  I was present and scrubbed and performed the bulk of the procedure.   Excisional debridement:    Tool used for debridement (curette, scapel, etc.)  scapel    Frequency of surgical debridement.   initial    Measurement of total devitalized tissue (wound surface) before and after surgical debridement.   Before 5 x 4cm; after 8 x 7 x 3 cm deep   Area and depth of devitalized tissue removed from wound.  8x7x3 cm     Blood loss and description of tissue removed.  <20cc; necrotic skin, soft tissue and muscle    Was there any viable tissue removed (measurements): minimal; <2 cm2  PLAN OF CARE: inpt  PATIENT DISPOSITION:  PACU -  hemodynamically stable.   Delay start of Pharmacological VTE agent (>24hrs) due to surgical blood loss or risk of bleeding:  no  [name]. Andrey Campanile, MD, FACS General, Bariatric, & Minimally Invasive Surgery [facility], [place]

## 2018-01-25 NOTE — Consult Note (Signed)
[facility] for Infectious Disease  Total days of antibiotics 1       Reason for Consult: nec fascitis   Referring Physician: sheikh  Principal Problem:   Sepsis ([place]) Active Problems:   Rheumatoid arthritis ([place])   Dementia   Urinary tract infection without hematuria   Pressure injury of skin of right buttock    HPI: [name] [name] is a 84 y.o. female with dementia who has been in a skilled facility has worsening mentation and fevers over the last 3days. Admitted to [facility] for evaluation. She is afebrile, though had significant leukocytosis of 30k on admit. uanble to get much history from patient. Her physical exam revealed right buttock pressure wound with foul smelling eschar. Further work up including CT that  Showed signs of possible right hamstring IM abscess, and gluteal necrotizing fascitis. Surgery evaluated her and did not think it was necrotizing fasciitis but could benefit from I x D of right gluteal region.  She is being prepared to be taken to the OR for I x D of pressure ulcer of right buttock by dr Redmond Pulling. Initial measurement of total devitalized tissue (wound surface) before and after surgical debridement.   Before 5 x 4cm; after 8 x 7 x 3 cm deep Area and depth of devitalized tissue removed from wound.  8x7x3 cm .    IMPRESSION: 1. RIGHT gluteal necrotizing fasciitis with subcutaneous gas and myositis. Possible RIGHT hamstring intramuscular abscess though limited by hardware artifact. 2. Focal cortical dehiscence proximal RIGHT femur most compatible with osteomyelitis. 3. Partially imaged enteritis without bowel obstruction. 4. Nonspecific 7.5 x 7.7 x 6.3 cm cyst or fluid collection in pelvis. Past Medical History:  Diagnosis Date  . Arthritis   . Cataract   . Glaucoma     Allergies: No Known Allergies   MEDICATIONS: . Chlorhexidine Gluconate Cloth  6 each Topical Q0600  . cycloSPORINE  1 drop Both Eyes BID  . [START ON 01/26/2018] enoxaparin  (LOVENOX) injection  30 mg Subcutaneous Q24H  . [START ON 01/26/2018] Influenza vac split quadrivalent PF  0.5 mL Intramuscular Tomorrow-1000  . mupirocin ointment  1 application Nasal BID    Social History   Tobacco Use  . Smoking status: Former Smoker    Packs/day: 0.50    Types: Cigarettes    Start date: 06[birth year]28/1940    Last attempt to quit: 04/13/2016    Years since quitting: 1.7  . Smokeless tobacco: Never Used  . Tobacco comment: Stopped for a total of 2 years  Substance Use Topics  . Alcohol use: No  . Drug use: No    Family History  Problem Relation Age of Onset  . Stroke Father   . Stroke Sister   . Anemia Sister   . Lung disease Neg Hx     Review of Systems -  Unable to obtain due to dementia  OBJECTIVE: Temp:  [97.5 F (36.4 C)-98.2 F (36.8 C)] 97.9 F (36.6 C) (09/29 1733) Pulse Rate:  [88-114] 93 (09/29 1734) Resp:  [12-23] 14 (09/29 1734) BP: (99-138)/(53-75) 103/58 (09/29 1734) SpO2:  [80 %-100 %] 80 % (09/29 1734)  Physical Exam  Constitutional:  oriented to person,only. appears frail and under-nourished.wimpering. HENT:  Shores/AT, PERRLA, no scleral icterus Mouth/Throat: Oropharynx is dry. No oropharyngeal exudate.  Cardiovascular: Normal rate, regular rhythm and normal heart sounds. Exam reveals no gallop and no friction rub.  No murmur heard.  Pulmonary/Chest: Effort normal and breath sounds normal. No respiratory distress.  has no wheezes.  Neck = supple, no nuchal rigidity Abdominal: Soft. Bowel sounds are normal.  exhibits no distension. There is no tenderness.  Buttock = foul smelling drainage but area wrapped Lymphadenopathy: no cervical adenopathy. No axillary adenopathy Neurological: alert and oriented to person only. Skin: decubitis ulcer Psychiatric: tearful, and agitated   LABS: Results for orders placed or performed during the hospital encounter of 01/24/18 (from the past 48 hour(s))  Blood gas, venous     Status: Abnormal   Collection  Time: 01/24/18  8:38 PM  Result Value Ref Range   FIO2 21.00    pH, Ven 7.448 (H) 7.250 - 7.430   pCO2, Ven 36.0 (L) 44.0 - 60.0 mmHg   pO2, Ven 47.5 (H) 32.0 - 45.0 mmHg   Bicarbonate 24.5 20.0 - 28.0 mmol/L   Acid-Base Excess 1.4 0.0 - 2.0 mmol/L   O2 Saturation 83.2 %   Patient temperature 98.2    Collection site VEIN    Drawn by COLLECTED BY NURSE    Sample type VENOUS     Comment: Performed at [facility], [place] [address] [place], [place] [address]  I-Stat CG4 Lactic Acid, ED  (not at  [facility])     Status: Abnormal   Collection Time: 01/24/18  8:41 PM  Result Value Ref Range   Lactic Acid, Venous 2.29 (HH) 0.5 - 1.9 mmol/L   Comment NOTIFIED PHYSICIAN   Comprehensive metabolic panel     Status: Abnormal   Collection Time: 01/24/18  8:43 PM  Result Value Ref Range   Sodium 145 135 - 145 mmol/L   Potassium 4.7 3.5 - 5.1 mmol/L   Chloride 113 (H) 98 - 111 mmol/L   CO2 23 22 - 32 mmol/L   Glucose, Bld 103 (H) 70 - 99 mg/dL   BUN 60 (H) 8 - 23 mg/dL   Creatinine, Ser 1.12 (H) 0.44 - 1.00 mg/dL   Calcium 8.8 (L) 8.9 - 10.3 mg/dL   Total Protein 6.5 6.5 - 8.1 g/dL   Albumin 2.3 (L) 3.5 - 5.0 g/dL   AST 22 15 - 41 U/L   ALT 17 0 - 44 U/L   Alkaline Phosphatase 106 38 - 126 U/L   Total Bilirubin 0.9 0.3 - 1.2 mg/dL   GFR calc non Af Amer 41 (L) >60 mL/min   GFR calc Af Amer 48 (L) >60 mL/min    Comment: (NOTE) The eGFR has been calculated using the CKD EPI equation. This calculation has not been validated in all clinical situations. eGFR's persistently <60 mL/min signify possible Chronic Kidney Disease.    Anion gap 9 5 - 15    Comment: Performed at [facility], [place] [address], [place], [place] [address]  CBC WITH DIFFERENTIAL     Status: Abnormal   Collection Time: 01/24/18  8:43 PM  Result Value Ref Range   WBC 20.4 (H) 4.0 - 10.5 K/uL   RBC 4.35 3.87 - 5.11 MIL/uL   Hemoglobin 13.2 12.0 - 15.0 g/dL   HCT 41.4 36.0 - 46.0 %   MCV  95.2 78.0 - 100.0 fL   MCH 30.3 26.0 - 34.0 pg   MCHC 31.9 30.0 - 36.0 g/dL   RDW 16.9 (H) 11.5 - 15.5 %   Platelets 479 (H) 150 - 400 K/uL   Neutrophils Relative % 94 %   Lymphocytes Relative 4 %   Monocytes Relative 2 %   Eosinophils Relative 0 %   Basophils Relative  0 %   Neutro Abs 19.2 (H) 1.7 - 7.7 K/uL   Lymphs Abs 0.8 0.7 - 4.0 K/uL   Monocytes Absolute 0.4 0.1 - 1.0 K/uL   Eosinophils Absolute 0.0 0.0 - 0.7 K/uL   Basophils Absolute 0.0 0.0 - 0.1 K/uL   Smear Review MORPHOLOGY UNREMARKABLE     Comment: Performed at [facility], [place] [address] [place], [place] [address]  Blood Culture (routine x 2)     Status: None (Preliminary result)   Collection Time: 01/24/18  8:43 PM  Result Value Ref Range   Specimen Description      BLOOD RIGHT FOREARM Performed at [place] [address] [place], [place] [address]    Special Requests      BOTTLES DRAWN AEROBIC AND ANAEROBIC Blood Culture adequate volume Performed at [place] [address] [place], [place] [address]    Culture      NO GROWTH < 12 HOURS Performed at [place] [address], [place], [place] [address]    Report Status PENDING   Urinalysis, Routine w reflex microscopic     Status: Abnormal   Collection Time: 01/24/18  8:43 PM  Result Value Ref Range   Color, Urine AMBER (A) YELLOW    Comment: BIOCHEMICALS MAY BE AFFECTED BY COLOR   APPearance CLOUDY (A) CLEAR   Specific Gravity, Urine 1.012 1.005 - 1.030   pH 7.0 5.0 - 8.0   Glucose, UA NEGATIVE NEGATIVE mg/dL   Hgb urine dipstick LARGE (A) NEGATIVE   Bilirubin Urine NEGATIVE NEGATIVE   Ketones, ur NEGATIVE NEGATIVE mg/dL   Protein, ur 30 (A) NEGATIVE mg/dL   Nitrite POSITIVE (A) NEGATIVE   Leukocytes, UA LARGE (A) NEGATIVE   RBC / HPF 6-10 0 - 5 RBC/hpf   WBC, UA >50 (H) 0 - 5 WBC/hpf   Bacteria, UA MANY (A) NONE SEEN   Squamous Epithelial / LPF 0-5 0 - 5   WBC Clumps  PRESENT     Comment: Performed at [facility], [place] [address] [place], [place] [address]  Blood Culture (routine x 2)     Status: None (Preliminary result)   Collection Time: 01/24/18  9:00 PM  Result Value Ref Range   Specimen Description      BLOOD LEFT ARM Performed at [place] [address], [place], [place] [address]    Special Requests      BOTTLES DRAWN AEROBIC AND ANAEROBIC Blood Culture adequate volume Performed at [place] [address] [place], [place] [address]    Culture      NO GROWTH < 12 HOURS Performed at [place] [address], [place], [place] [address]    Report Status PENDING   I-Stat CG4 Lactic Acid, ED  (not at  [facility])     Status: None   Collection Time: 01/24/18 10:46 PM  Result Value Ref Range   Lactic Acid, Venous 1.82 0.5 - 1.9 mmol/L  MRSA PCR Screening     Status: None   Collection Time: 01/25/18  3:49 AM  Result Value Ref Range   MRSA by PCR NEGATIVE NEGATIVE    Comment:        The GeneXpert MRSA Assay (FDA approved for NASAL specimens only), is one component of a comprehensive MRSA colonization surveillance program. It is not intended to diagnose MRSA infection nor to guide or monitor treatment for MRSA infections.  Performed at [facility], [place] [address] [place], [place] [address]   Comprehensive metabolic panel     Status: Abnormal   Collection Time: 01/25/18  4:09 AM  Result Value Ref Range   Sodium 149 (H) 135 - 145 mmol/L   Potassium 4.2 3.5 - 5.1 mmol/L   Chloride 116 (H) 98 - 111 mmol/L   CO2 27 22 - 32 mmol/L   Glucose, Bld 118 (H) 70 - 99 mg/dL   BUN 54 (H) 8 - 23 mg/dL   Creatinine, Ser 1.11 (H) 0.44 - 1.00 mg/dL   Calcium 8.3 (L) 8.9 - 10.3 mg/dL   Total Protein 5.6 (L) 6.5 - 8.1 g/dL   Albumin 2.0 (L) 3.5 - 5.0 g/dL   AST 19 15 - 41 U/L   ALT 15 0 - 44 U/L   Alkaline Phosphatase 97 38 - 126 U/L   Total Bilirubin 0.6  0.3 - 1.2 mg/dL   GFR calc non Af Amer 41 (L) >60 mL/min   GFR calc Af Amer 48 (L) >60 mL/min    Comment: (NOTE) The eGFR has been calculated using the CKD EPI equation. This calculation has not been validated in all clinical situations. eGFR's persistently <60 mL/min signify possible Chronic Kidney Disease.    Anion gap 6 5 - 15    Comment: Performed at [facility], [place] [address], [place], [place] [address]  CBC WITH DIFFERENTIAL     Status: Abnormal   Collection Time: 01/25/18  4:09 AM  Result Value Ref Range   WBC 30.1 (H) 4.0 - 10.5 K/uL    Comment: REPEATED TO VERIFY WHITE COUNT CONFIRMED ON SMEAR    RBC 3.92 3.87 - 5.11 MIL/uL   Hemoglobin 11.7 (L) 12.0 - 15.0 g/dL   HCT 38.0 36.0 - 46.0 %   MCV 96.9 78.0 - 100.0 fL   MCH 29.8 26.0 - 34.0 pg   MCHC 30.8 30.0 - 36.0 g/dL   RDW 17.1 (H) 11.5 - 15.5 %   Platelets 405 (H) 150 - 400 K/uL    Comment: SPECIMEN CHECKED FOR CLOTS REPEATED TO VERIFY PLATELET COUNT CONFIRMED BY SMEAR    Neutrophils Relative % 93 %   Lymphocytes Relative 4 %   Monocytes Relative 3 %   Eosinophils Relative 0 %   Basophils Relative 0 %   Neutro Abs 28.0 (H) 1.7 - 7.7 K/uL   Lymphs Abs 1.2 0.7 - 4.0 K/uL   Monocytes Absolute 0.9 0.1 - 1.0 K/uL   Eosinophils Absolute 0.0 0.0 - 0.7 K/uL   Basophils Absolute 0.0 0.0 - 0.1 K/uL   WBC Morphology DOHLE BODIES     Comment: Performed at [facility], [place] [address] [place], [place] [address]  Protime-INR     Status: None   Collection Time: 01/25/18  7:36 AM  Result Value Ref Range   Prothrombin Time 13.9 11.4 - 15.2 seconds   INR 1.08     Comment: Performed at [facility], [place] [address], [place],  [address]    MICRO:  IMAGING: Ct Pelvis W Contrast  Result Date: 01/25/2018 CLINICAL DATA:  Infection, sepsis, urinary tract infection, pressure ulcer. EXAM: CT PELVIS WITH CONTRAST TECHNIQUE: Multidetector CT imaging of the pelvis  was performed using the standard protocol following the bolus administration of intravenous contrast. CONTRAST:  16m OMNIPAQUE IOHEXOL 300 MG/ML  SOLN COMPARISON:  Pelvic radiograph August 20, 2016 FINDINGS: Urinary Tract:  Decompressed by Foley catheter.  Bowel: RIGHT lower quadrant small bowel wall thickening and hyperenhancement. Included small and large bowel are normal in course and caliber. Vascular/Lymphatic: Severe calcific atherosclerosis. Mildly ectatic infrarenal aorta. No lymphadenopathy. Reproductive: Uterus not discretely identified, potentially obscured by hardware artifact. Other: 7.5 x 7.7 x 6.3 cm focal fluid or cyst without rim enhancement in presacral fat. Musculoskeletal: RIGHT gluteal 2 proximal femoral subcutaneous fat stranding with subcutaneous gas contiguous with ischial tuberosity which is intact. Effaced RIGHT hamstring fat planes and suspected fluid collection. Focal cortical dehiscence medial proximal femur (series 4, image 126) status post LEFT hip hemiarthroplasty and RIGHT femoral neck pinning with intramedullary rod. Osteopenia. IMPRESSION: 1. RIGHT gluteal necrotizing fasciitis with subcutaneous gas and myositis. Possible RIGHT hamstring intramuscular abscess though limited by hardware artifact. 2. Focal cortical dehiscence proximal RIGHT femur most compatible with osteomyelitis. 3. Partially imaged enteritis without bowel obstruction. 4. Nonspecific 7.5 x 7.7 x 6.3 cm cyst or fluid collection in pelvis. Electronically Signed   By: Elon Alas M.D.   On: 01/25/2018 02:57   Dg Chest Portable 1 View  Result Date: 01/24/2018 CLINICAL DATA:  Altered level of consciousness EXAM: PORTABLE CHEST 1 VIEW COMPARISON:  07/04/2017 FINDINGS: No focal opacity or pleural effusion. Cardiomediastinal silhouette within normal limits. Aortic atherosclerosis. No pneumothorax. Subacute to old left fourth rib fracture. IMPRESSION: No active disease. Electronically Signed   By: Donavan Foil  M.D.   On: 01/24/2018 20:27    Assessment/Plan:  84yo F presented with AMS, leukocytosis of 30K found to have concern for necrotizing fascitiis vs. Deep wound infection associated with gluteal pressure wound  - continue on piptazo. Would d/c since patient has negaitve MRSA screen - follow or cultures to see if can narrow abtx - will check sed rate and crp - likely plan for 4 wk of iv abtx

## 2018-01-25 NOTE — Transfer of Care (Signed)
Immediate Anesthesia Transfer of Care Note  Patient: [name] [name]  Procedure(s) Performed: Procedure(s): IRRIGATION AND DEBRIDEMENT ABSCESS- SACRAL DECUBITUS (Right)  Patient Location: PACU  Anesthesia Type:General  Level of Consciousness:  sedated, patient cooperative and responds to stimulation  Airway & Oxygen Therapy:Patient Spontanous Breathing and Patient connected to face mask oxgen  Post-op Assessment:  Report given to PACU RN and Post -op Vital signs reviewed and stable  Post vital signs:  Reviewed and stable  Last Vitals:  Vitals:   01/25/18 0300 01/25/18 0944  BP:    Pulse:  91  Resp:    Temp:    SpO2: 100% 100%    Complications: No apparent anesthesia complications

## 2018-01-25 NOTE — Progress Notes (Signed)
Pharmacy Antibiotic Note  [name] [name] is a 84 y.o. female admitted on 01/24/2018 with sepsis.  Pharmacy has been consulted for Vancomycin, cefepime dosing.  Plan: Cefepime 1gm iv q24hr  Vancomycin 1gm iv x1, then using estimated weight of 45kg would be 500mg  iv q36hr  Goal AUC = 400 - 500 for all indications, except meningitis (goal AUC > 500 and Cmin 15-20 mcg/mL)  Will have 1st shift follow up with patient weight/renal function and place future vancomycin orders     Temp (24hrs), Avg:97.9 F (36.6 C), Min:97.5 F (36.4 C), Max:98.2 F (36.8 C)  Recent Labs  Lab 01/24/18 2041 01/24/18 2043 01/24/18 2246  WBC  --  20.4*  --   CREATININE  --  1.12*  --   LATICACIDVEN 2.29*  --  1.82    CrCl cannot be calculated (Unknown ideal weight.).    No Known Allergies  Antimicrobials this admission: Vancomycin 01/24/2018 >> Cefepime 01/25/2018 >>   Dose adjustments this admission: -  Microbiology results: -  Thank you for allowing pharmacy to be a part of this patient's care.  01/27/2018 [place] 01/25/2018 4:24 AM

## 2018-01-25 NOTE — Anesthesia Procedure Notes (Signed)
Procedure Name: Intubation Date/Time: 01/25/2018 2:08 PM Performed by: Anne Fu, CRNA Pre-anesthesia Checklist: Patient identified, Emergency Drugs available, Suction available, Patient being monitored and Timeout performed Patient Re-evaluated:Patient Re-evaluated prior to induction Oxygen Delivery Method: Circle system utilized Preoxygenation: Pre-oxygenation with 100% oxygen Induction Type: IV induction Ventilation: Mask ventilation without difficulty Laryngoscope Size: Mac and 4 Grade View: Grade II Tube type: Oral Tube size: 7.5 mm Number of attempts: 1 Airway Equipment and Method: Stylet Placement Confirmation: ETT inserted through vocal cords under direct vision,  positive ETCO2 and breath sounds checked- equal and bilateral Secured at: 21 cm Tube secured with: Tape Dental Injury: Teeth and Oropharynx as per pre-operative assessment  Comments: DL X 1 with noted large secretion present in oral airway, suctioned noted Grade 2 view tube passed without incidences.  Noted bilat lung sounds post tube placement +ETCO2 present.

## 2018-01-25 NOTE — Progress Notes (Signed)
Received pt from PACU at 1640. Pt had an order for cardiac monitoring. Marland Mcalpine, MD was paged/notified since [facility] is not a telemetry unit. Marland Mcalpine indicated that a transfer order is appropriate.

## 2018-01-26 ENCOUNTER — Encounter (HOSPITAL_COMMUNITY): Payer: Self-pay | Admitting: General Surgery

## 2018-01-26 DIAGNOSIS — L89314 Pressure ulcer of right buttock, stage 4: Secondary | ICD-10-CM | POA: Diagnosis present

## 2018-01-26 DIAGNOSIS — N179 Acute kidney failure, unspecified: Secondary | ICD-10-CM | POA: Diagnosis present

## 2018-01-26 DIAGNOSIS — H409 Unspecified glaucoma: Secondary | ICD-10-CM | POA: Diagnosis present

## 2018-01-26 DIAGNOSIS — Z681 Body mass index (BMI) 19 or less, adult: Secondary | ICD-10-CM | POA: Diagnosis not present

## 2018-01-26 DIAGNOSIS — B9620 Unspecified Escherichia coli [E. coli] as the cause of diseases classified elsewhere: Secondary | ICD-10-CM

## 2018-01-26 DIAGNOSIS — G9341 Metabolic encephalopathy: Secondary | ICD-10-CM | POA: Diagnosis present

## 2018-01-26 DIAGNOSIS — Z7189 Other specified counseling: Secondary | ICD-10-CM | POA: Diagnosis not present

## 2018-01-26 DIAGNOSIS — H269 Unspecified cataract: Secondary | ICD-10-CM | POA: Diagnosis present

## 2018-01-26 DIAGNOSIS — Z823 Family history of stroke: Secondary | ICD-10-CM | POA: Diagnosis not present

## 2018-01-26 DIAGNOSIS — M069 Rheumatoid arthritis, unspecified: Secondary | ICD-10-CM | POA: Diagnosis present

## 2018-01-26 DIAGNOSIS — L03317 Cellulitis of buttock: Secondary | ICD-10-CM | POA: Diagnosis present

## 2018-01-26 DIAGNOSIS — T8132XA Disruption of internal operation (surgical) wound, not elsewhere classified, initial encounter: Secondary | ICD-10-CM | POA: Diagnosis present

## 2018-01-26 DIAGNOSIS — G9349 Other encephalopathy: Secondary | ICD-10-CM | POA: Diagnosis present

## 2018-01-26 DIAGNOSIS — M1990 Unspecified osteoarthritis, unspecified site: Secondary | ICD-10-CM | POA: Diagnosis present

## 2018-01-26 DIAGNOSIS — A419 Sepsis, unspecified organism: Secondary | ICD-10-CM | POA: Diagnosis present

## 2018-01-26 DIAGNOSIS — W19XXXD Unspecified fall, subsequent encounter: Secondary | ICD-10-CM | POA: Diagnosis not present

## 2018-01-26 DIAGNOSIS — Z7401 Bed confinement status: Secondary | ICD-10-CM | POA: Diagnosis not present

## 2018-01-26 DIAGNOSIS — Z66 Do not resuscitate: Secondary | ICD-10-CM | POA: Diagnosis present

## 2018-01-26 DIAGNOSIS — R64 Cachexia: Secondary | ICD-10-CM | POA: Diagnosis present

## 2018-01-26 DIAGNOSIS — M609 Myositis, unspecified: Secondary | ICD-10-CM | POA: Diagnosis not present

## 2018-01-26 DIAGNOSIS — E870 Hyperosmolality and hypernatremia: Secondary | ICD-10-CM | POA: Diagnosis not present

## 2018-01-26 DIAGNOSIS — E43 Unspecified severe protein-calorie malnutrition: Secondary | ICD-10-CM | POA: Diagnosis present

## 2018-01-26 DIAGNOSIS — M8608 Acute hematogenous osteomyelitis, other sites: Secondary | ICD-10-CM | POA: Diagnosis not present

## 2018-01-26 DIAGNOSIS — L89319 Pressure ulcer of right buttock, unspecified stage: Secondary | ICD-10-CM | POA: Diagnosis not present

## 2018-01-26 DIAGNOSIS — R6520 Severe sepsis without septic shock: Secondary | ICD-10-CM | POA: Diagnosis present

## 2018-01-26 DIAGNOSIS — Z515 Encounter for palliative care: Secondary | ICD-10-CM | POA: Diagnosis not present

## 2018-01-26 DIAGNOSIS — N390 Urinary tract infection, site not specified: Secondary | ICD-10-CM | POA: Diagnosis present

## 2018-01-26 DIAGNOSIS — L89310 Pressure ulcer of right buttock, unstageable: Secondary | ICD-10-CM

## 2018-01-26 DIAGNOSIS — F0390 Unspecified dementia without behavioral disturbance: Secondary | ICD-10-CM | POA: Diagnosis present

## 2018-01-26 DIAGNOSIS — M869 Osteomyelitis, unspecified: Secondary | ICD-10-CM | POA: Diagnosis present

## 2018-01-26 DIAGNOSIS — N3000 Acute cystitis without hematuria: Secondary | ICD-10-CM | POA: Diagnosis not present

## 2018-01-26 DIAGNOSIS — Z23 Encounter for immunization: Secondary | ICD-10-CM | POA: Diagnosis present

## 2018-01-26 LAB — CBC
HEMATOCRIT: 37.4 % (ref 36.0–46.0)
Hemoglobin: 11.1 g/dL — ABNORMAL LOW (ref 12.0–15.0)
MCH: 29.6 pg (ref 26.0–34.0)
MCHC: 29.7 g/dL — ABNORMAL LOW (ref 30.0–36.0)
MCV: 99.7 fL (ref 78.0–100.0)
Platelets: 351 10*3/uL (ref 150–400)
RBC: 3.75 MIL/uL — ABNORMAL LOW (ref 3.87–5.11)
RDW: 17.5 % — AB (ref 11.5–15.5)
WBC: 21.1 10*3/uL — AB (ref 4.0–10.5)

## 2018-01-26 LAB — BASIC METABOLIC PANEL
Anion gap: 9 (ref 5–15)
BUN: 39 mg/dL — ABNORMAL HIGH (ref 8–23)
CALCIUM: 7.9 mg/dL — AB (ref 8.9–10.3)
CO2: 24 mmol/L (ref 22–32)
Chloride: 121 mmol/L — ABNORMAL HIGH (ref 98–111)
Creatinine, Ser: 0.99 mg/dL (ref 0.44–1.00)
GFR calc Af Amer: 55 mL/min — ABNORMAL LOW (ref >60)
GFR, EST NON AFRICAN AMERICAN: 48 mL/min — AB (ref >60)
Glucose, Bld: 88 mg/dL (ref 70–99)
Potassium: 3.9 mmol/L (ref 3.5–5.1)
Sodium: 154 mmol/L — ABNORMAL HIGH (ref 135–145)

## 2018-01-26 MED ORDER — VANCOMYCIN HCL 500 MG IV SOLR
500.0000 mg | INTRAVENOUS | Status: DC
  Filled 2018-01-26: qty 500

## 2018-01-26 MED ORDER — DEXTROSE 5 % IV SOLN
INTRAVENOUS | Status: DC
  Administered 2018-01-26 – 2018-01-29 (×7): via INTRAVENOUS

## 2018-01-26 MED ORDER — SODIUM CHLORIDE 0.9 % IV SOLN
INTRAVENOUS | Status: DC | PRN
  Administered 2018-01-26 – 2018-01-28 (×4): 250 mL via INTRAVENOUS

## 2018-01-26 NOTE — Progress Notes (Signed)
[facility]/Trauma Progress Note  1 Day Post-Op   Assessment/Plan Principal Problem:   Sepsis (HCC) Active Problems:   Rheumatoid arthritis (HCC)   Dementia   Urinary tract infection without hematuria   Pressure injury of skin of right buttock  Sacral wound - S/P debridement Dr. Andrey Campanile, 09/29 - wound would benefit from hydrotherapy if pt can tolerate and/or wet to dry dressing changes  We will follow    LOS: 0 days    Subjective: CC: sacral wound  Pt sleeping. No issues today per nurse. Pt was given morphine for dressing change.   Objective: Vital signs in last 24 hours: Temp:  [97.3 F (36.3 C)-98.3 F (36.8 C)] 98 F (36.7 C) (09/30 1003) Pulse Rate:  [87-101] 91 (09/30 1003) Resp:  [12-20] 16 (09/30 1003) BP: (90-118)/(53-83) 105/83 (09/30 1003) SpO2:  [70 %-100 %] 98 % (09/30 1003) Last BM Date: (unknown)  Intake/Output from previous day: 09/29 0701 - 09/30 0700 In: 1674.1 [I.V.:1333.1; IV Piggyback:341] Out: 460 [Urine:420; Blood:40] Intake/Output this shift: Total I/O In: 100 [P.O.:100] Out: 175 [Urine:175]  PE: Gen:  Sleeping, will awake Pulm:  Rate and effort normal GU: see photo below Skin: no rashes noted, warm and dry      Anti-infectives: Anti-infectives (From admission, onward)   Start     Dose/Rate Route Frequency Ordered Stop   01/26/18 2200  vancomycin (VANCOCIN) 500 mg in sodium chloride 0.9 % 100 mL IVPB  Status:  Discontinued     500 mg 100 mL/hr over 60 Minutes Intravenous Every 48 hours 01/25/18 1018 01/26/18 1013   01/26/18 2200  vancomycin (VANCOCIN) 500 mg in sodium chloride 0.9 % 100 mL IVPB     500 mg 100 mL/hr over 60 Minutes Intravenous Every 48 hours 01/26/18 1024     01/25/18 2200  ceFEPIme (MAXIPIME) 1 g in sodium chloride 0.9 % 100 mL IVPB  Status:  Discontinued     1 g 200 mL/hr over 30 Minutes Intravenous Every 24 hours 01/25/18 0423 01/25/18 0859   01/25/18 1000  piperacillin-tazobactam (ZOSYN)  IVPB 3.375 g     3.375 g 12.5 mL/hr over 240 Minutes Intravenous Every 8 hours 01/25/18 0917     01/25/18 0830  metroNIDAZOLE (FLAGYL) IVPB 500 mg  Status:  Discontinued     500 mg 100 mL/hr over 60 Minutes Intravenous Every 8 hours 01/25/18 0807 01/25/18 0859   01/25/18 0015  ceFEPIme (MAXIPIME) 1 g in sodium chloride 0.9 % 100 mL IVPB  Status:  Discontinued     1 g 200 mL/hr over 30 Minutes Intravenous Every 8 hours 01/25/18 0011 01/25/18 0422   01/24/18 2230  vancomycin (VANCOCIN) IVPB 1000 mg/200 mL premix     1,000 mg 200 mL/hr over 60 Minutes Intravenous  Once 01/24/18 2218 01/25/18 0002   01/24/18 2145  cefTRIAXone (ROCEPHIN) 1 g in sodium chloride 0.9 % 100 mL IVPB     1 g 200 mL/hr over 30 Minutes Intravenous  Once 01/24/18 2141 01/24/18 2242      Lab Results:  Recent Labs    01/25/18 0409 01/26/18 0522  WBC 30.1* 21.1*  HGB 11.7* 11.1*  HCT 38.0 37.4  PLT 405* 351   BMET Recent Labs    01/25/18 0409 01/26/18 0522  NA 149* 154*  K 4.2 3.9  CL 116* 121*  CO2 27 24  GLUCOSE 118* 88  BUN 54* 39*  CREATININE 1.11* 0.99  CALCIUM 8.3* 7.9*   PT/INR Recent Labs  01/25/18 0736  LABPROT 13.9  INR 1.08   CMP     Component Value Date/Time   NA 154 (H) 01/26/2018 0522   NA 142 08/29/2016   K 3.9 01/26/2018 0522   CL 121 (H) 01/26/2018 0522   CO2 24 01/26/2018 0522   GLUCOSE 88 01/26/2018 0522   BUN 39 (H) 01/26/2018 0522   BUN 16 08/29/2016   CREATININE 0.99 01/26/2018 0522   CALCIUM 7.9 (L) 01/26/2018 0522   PROT 5.6 (L) 01/25/2018 0409   ALBUMIN 2.0 (L) 01/25/2018 0409   AST 19 01/25/2018 0409   ALT 15 01/25/2018 0409   ALKPHOS 97 01/25/2018 0409   BILITOT 0.6 01/25/2018 0409   GFRNONAA 48 (L) 01/26/2018 0522   GFRAA 55 (L) 01/26/2018 0522   Lipase  No results found for: LIPASE  Studies/Results: Ct Pelvis W Contrast  Result Date: 01/25/2018 CLINICAL DATA:  Infection, sepsis, urinary tract infection, pressure ulcer. EXAM: CT PELVIS WITH  CONTRAST TECHNIQUE: Multidetector CT imaging of the pelvis was performed using the standard protocol following the bolus administration of intravenous contrast. CONTRAST:  64mL OMNIPAQUE IOHEXOL 300 MG/ML  SOLN COMPARISON:  Pelvic radiograph August 20, 2016 FINDINGS: Urinary Tract:  Decompressed by Foley catheter. Bowel: RIGHT lower quadrant small bowel wall thickening and hyperenhancement. Included small and large bowel are normal in course and caliber. Vascular/Lymphatic: Severe calcific atherosclerosis. Mildly ectatic infrarenal aorta. No lymphadenopathy. Reproductive: Uterus not discretely identified, potentially obscured by hardware artifact. Other: 7.5 x 7.7 x 6.3 cm focal fluid or cyst without rim enhancement in presacral fat. Musculoskeletal: RIGHT gluteal 2 proximal femoral subcutaneous fat stranding with subcutaneous gas contiguous with ischial tuberosity which is intact. Effaced RIGHT hamstring fat planes and suspected fluid collection. Focal cortical dehiscence medial proximal femur (series 4, image 126) status post LEFT hip hemiarthroplasty and RIGHT femoral neck pinning with intramedullary rod. Osteopenia. IMPRESSION: 1. RIGHT gluteal necrotizing fasciitis with subcutaneous gas and myositis. Possible RIGHT hamstring intramuscular abscess though limited by hardware artifact. 2. Focal cortical dehiscence proximal RIGHT femur most compatible with osteomyelitis. 3. Partially imaged enteritis without bowel obstruction. 4. Nonspecific 7.5 x 7.7 x 6.3 cm cyst or fluid collection in pelvis. Electronically Signed   By: Awilda Metro M.D.   On: 01/25/2018 02:57   Dg Chest Portable 1 View  Result Date: 01/24/2018 CLINICAL DATA:  Altered level of consciousness EXAM: PORTABLE CHEST 1 VIEW COMPARISON:  07/04/2017 FINDINGS: No focal opacity or pleural effusion. Cardiomediastinal silhouette within normal limits. Aortic atherosclerosis. No pneumothorax. Subacute to old left fourth rib fracture. IMPRESSION: No  active disease. Electronically Signed   By: Jasmine Pang M.D.   On: 01/24/2018 20:27      Jerre Simon , [facility] Surgery 01/26/2018, 12:41 PM  Pager: [phone] Mon-Wed, Friday 7:00am-4:30pm Thurs 7am-11:30am  Consults: [phone]

## 2018-01-26 NOTE — Plan of Care (Signed)
Pt having good urine OP

## 2018-01-26 NOTE — Progress Notes (Signed)
Pharmacy Antibiotic Note  [name] [name] is a 84 y.o. female admitted on 01/24/2018 with sepsis, UTI, Infected decubitus ulcer over right ischium, NOT likely necrotizing facsiitis.  Patient is s/p debridement.  Pharmacy has been consulted for Vancomycin and Zosyn dosing.  SCr 0.99 WBC 21.1 Afebrile Weight - Last documented weight 44.5 kg (01/03/18).  Currently unable to stand for weight; RN/NT unable to obtain bed weight on 2 different beds.  One bed showed weight of 30 kg, but was unable to be repeated or confirmed.  However, wound culture is now growing abundant  GPC in pairs and clusters , abundant GNR, and few GPR.  Plan:  Continue Zosyn 3.375g IV Q8H infused over 4hrs.  ID mentioned possible discontinuation of vancomycin.  Will f/u ID note regarding plans for vancomycin.   If vancomycin is to continue, will need to decrease interval to q 36h   (SCr 0.99, Wt 44 kg, TBW/TBW, est AUC 469, est VT 12)  Check vancomycin levels if remains on vancomycin > 3-4 days.  Goal AUC 400-500.  Follow up renal fxn, culture results, and clinical course.  F/u ability to de-escalate antibiotics.  Height: 5\' 2"  (157.5 cm) IBW/kg (Calculated) : 50.1  Temp (24hrs), Avg:97.8 F (36.6 C), Min:97.3 F (36.3 C), Max:98.3 F (36.8 C)  Recent Labs  Lab 01/24/18 2041 01/24/18 2043 01/24/18 2246 01/25/18 0409 01/26/18 0522  WBC  --  20.4*  --  30.1* 21.1*  CREATININE  --  1.12*  --  1.11* 0.99  LATICACIDVEN 2.29*  --  1.82  --   --     CrCl cannot be calculated (Unknown ideal weight.).    Using most recent weight of 44 kg, SCr 1.11, CrCl ~ 21.7 ml/min   No Known Allergies  Antimicrobials this admission: 9/28 Ceftriaxone x1 dose 9/28 Vancomycin >>  9/29 Cefepime >> 9/29 9/29 Zosyn >>   Dose adjustments this admission:   Microbiology results: 9/28 BCx: NGTD 9/28 UCx:  E coli 9/29 MRSA PCR: negative  Thank you for allowing pharmacy to be a part of this patient's care.  10/29,  PharmD Clinical Pharmacist Pager # [phone]  01/26/2018 12:39 PM

## 2018-01-26 NOTE — Progress Notes (Signed)
PROGRESS NOTE    [name] [name]  WLN:[id number] DOB: May 13, 1922 DOA: 01/24/2018 PCP: Alysia Penna, MD   Brief Narrative:  The patient is a elderly thin frail and cachectic appearing Caucasian female with a past medical history significant for rheumatoid arthritis, dementia, cataracts, glaucoma, who is essentially bedbound from a skilled nursing facility at [facility] who presents with increasingly worse confusion and poor p.o. intake for last 3 days.  Patient had a fall 6 weeks ago when she had a periprosthetic hip fracture and was managed conservatively and subsequently developed sacral decubitus ulcers on her buttock.  She is admitted for a suspected UTI however the right buttock ulcer looked infected and a CT of the abdomen pelvis was done and it showed concern for necrotizing fasciitis with a right hamstring intramuscular abscess.  General surgery was consulted and did not feel that she had true necrotizing fasciitis however nonetheless there do not take the patient to the OR for debridement.  She also had a focal cortical dehiscence of the right proximal femur most compatible with osteomyelitis.  Infectious disease was consulted for further evaluation recommendations and have de-escalate antibiotics to just IV Zosyn.  Palliative care has been called for goals of care and SLP will be done for swallow evaluation.  Assessment & Plan:   Principal Problem:   Sepsis (HCC) Active Problems:   Rheumatoid arthritis (HCC)   Dementia   Urinary tract infection without hematuria   Pressure injury of skin of right buttock  Sepsis likely from UTI and and infected right buttock decubitus ulcer with ? Necrotizing Fascitis and Right Hamstring Intra Muscular Abscess and Assosciated Right Femur Osteomyelitis s/p I and D -Sepsis physiology is now improving; lactic acid is trended down -Urine indicative of a urinary tract infection as it showed cloudy appearance, amber color, large hemoglobin, large  leukocytes, positive nitrites, many bacteria, greater than 50 WBCs, and positive urine culture with 100,000 colonies of E. coli -Patient has been placed on empiric antibiotics and de-escalate it just to Zosyn.   -ID has been formally consulted and recommending discontinuing vancomycin -Follow cultures.  -CT of the abdomen pelvis done and showed: "RIGHT gluteal necrotizing fasciitis with subcutaneous gas and myositis. Possible RIGHT hamstring intramuscular abscess though limited by hardware artifact. 2. Focal cortical dehiscence proximal RIGHT femur most compatible with osteomyelitis. 3. Partially imaged enteritis without bowel obstruction. 4. Nonspecific 7.5 x 7.7 x 6.3 cm cyst or fluid collection in pelvis." -General surgery consulted and evaluated and do not feel this is truly necrotizing fasciitis but nonetheless the patient for excisional debridement of the skin, soft tissue debridement of the right ischial decubitus ulcer -Wound nurse consulted for further evaluation -General surgery recommending hydrotherapy -Patient is afebrile and white count is coming down and went from 30,100 and is now 21,100 -Blood cultures x2 showed no growth at 2 days  -Right ischial decubitus ulcer Gram stain shows  NO WBC SEEN, ABUNDANT GRAM NEGATIVE RODS,  ABUNDANT, GRAM POSITIVE COCCI IN PAIRS IN CLUSTERS, FEW GRAM POSITIVE RODS with culture still pending -We will continue take antibiotics as recommended by ID and have consulted palliative care goals of care discussion  Rheumatoid Arthritis  -Would hold methotrexate until patient sepsis improves.  Hypernatremia/hyperchloremia -Patient's sodium went from 145 and was elevated to 154 -Patient's chloride went from 113 and now elevated to 121 -IV fluids with normal saline have been stopped and patient was started on D5W at 75 mL's per hour -5 years continue to monitor and trend sodium  level and repeat CMP in a.m.  Acute Renal Failure, improving -Patient's  BUN/creatinine went from 60/1.12 and is now trended down to 39/0.99 with IV fluid hydration -Avoid nephrotoxic medications -To monitor and trend renal function and repeat CMP in a.m.  Acute Encephalopathy/Confusion and Poor Po Intake with Underlying Dementia in the setting of UTI and Gluteal pressure wound and femur osteomyelitis with ? Necrotizing Fasciitis and electrolyte abnormalities with type per natremia -More alert today after antibiotic treatment and debridement but still confused -Treat Hypernatremia -We will obtain SLP for formal Swallow Eval -Continue with antibiotics per ID -Mentation is slightly improved so will hold off on Head CT  Normocytic Anemia -Patient's hemoglobin/hematocrit went from 13.2/41.4 and is now 11.1/37.4 -In the setting of dilutional drop -Check anemia panel in the a.m. -Continue monitor for signs and symptoms of bleeding -Repeat CBC in a.m.  DVT prophylaxis: Enoxaparin 30 mg subcu Code Status: DO NOT RESUSCITATE  Family Communication: No family present at bedside Disposition Plan: SNF but will have Palliative address GOC prior to D/C  Consultants:   General Surgery  Infectious Diseases  Palliative Care Medicine   Procedures:   Irrigation and debridement done by Dr. Gaynelle Adu on 01/25/2018  Antimicrobials:  Anti-infectives (From admission, onward)   Start     Dose/Rate Route Frequency Ordered Stop   01/26/18 2200  vancomycin (VANCOCIN) 500 mg in sodium chloride 0.9 % 100 mL IVPB  Status:  Discontinued     500 mg 100 mL/hr over 60 Minutes Intravenous Every 48 hours 01/25/18 1018 01/26/18 1013   01/26/18 2200  vancomycin (VANCOCIN) 500 mg in sodium chloride 0.9 % 100 mL IVPB  Status:  Discontinued     500 mg 100 mL/hr over 60 Minutes Intravenous Every 48 hours 01/26/18 1024 01/26/18 1313   01/26/18 2000  vancomycin (VANCOCIN) 500 mg in sodium chloride 0.9 % 100 mL IVPB     500 mg 100 mL/hr over 60 Minutes Intravenous Every 36 hours  01/26/18 1313     01/25/18 2200  ceFEPIme (MAXIPIME) 1 g in sodium chloride 0.9 % 100 mL IVPB  Status:  Discontinued     1 g 200 mL/hr over 30 Minutes Intravenous Every 24 hours 01/25/18 0423 01/25/18 0859   01/25/18 1000  piperacillin-tazobactam (ZOSYN) IVPB 3.375 g     3.375 g 12.5 mL/hr over 240 Minutes Intravenous Every 8 hours 01/25/18 0917     01/25/18 0830  metroNIDAZOLE (FLAGYL) IVPB 500 mg  Status:  Discontinued     500 mg 100 mL/hr over 60 Minutes Intravenous Every 8 hours 01/25/18 0807 01/25/18 0859   01/25/18 0015  ceFEPIme (MAXIPIME) 1 g in sodium chloride 0.9 % 100 mL IVPB  Status:  Discontinued     1 g 200 mL/hr over 30 Minutes Intravenous Every 8 hours 01/25/18 0011 01/25/18 0422   01/24/18 2230  vancomycin (VANCOCIN) IVPB 1000 mg/200 mL premix     1,000 mg 200 mL/hr over 60 Minutes Intravenous  Once 01/24/18 2218 01/25/18 0002   01/24/18 2145  cefTRIAXone (ROCEPHIN) 1 g in sodium chloride 0.9 % 100 mL IVPB     1 g 200 mL/hr over 30 Minutes Intravenous  Once 01/24/18 2141 01/24/18 2242     Subjective: Seen and examined at bedside and was more awake and more talkative but still very confused secondary to dementia.  No subjective history is unable to be obtained.  She states however she was complaining some back pain.  No other concerns claims at  this time  Objective: Vitals:   01/26/18 0640 01/26/18 1003 01/26/18 1352 01/26/18 1434  BP:  105/83 121/65   Pulse: 88 91 86   Resp:  16 16   Temp:  98 F (36.7 C) 97.6 F (36.4 C)   TempSrc:  Oral Axillary   SpO2: 100% 98% 99% 95%  Height:        Intake/Output Summary (Last 24 hours) at 01/26/2018 1744 Last data filed at 01/26/2018 1727 Gross per 24 hour  Intake 1093.78 ml  Output 175 ml  Net 918.78 ml   There were no vitals filed for this visit.  Examination: Physical Exam:  Constitutional: Thin elderly frail Caucasian cachectic appearing female who is currently in NAD and appears calm but is  uncomfortable Eyes: Lids and conjunctivae normal, sclerae anicteric  ENMT: External Ears, Nose appear normal.  Somewhat hard of hearing   Neck: Appears normal, supple, no cervical masses, normal ROM, no appreciable thyromegaly; no JVD Respiratory: Diminished to auscultation bilaterally, no wheezing, rales, rhonchi or crackles. Normal respiratory effort and patient is not tachypenic. No accessory muscle use.  Cardiovascular: RRR, 2 out of 6 systolic murmur. Trace extremity edema. Abdomen: Soft, non-tender, non-distended. No masses palpated. No appreciable hepatosplenomegaly. Bowel sounds positive x4.  GU: Deferred. Musculoskeletal: No clubbing / cyanosis of digits/nails. No joint deformity upper and lower extremities. Skin: Has a right ischial decubitus ulcer that is covered today after debridement by surgery yesterday Neurologic: Independently moves all extremities.  Cranial nerves II through XII grossly intact Psychiatric: Impaired judgment and insight. Alert and oriented x 1.  Anxious mood and appropriate affect.   Data Reviewed: I have personally reviewed following labs and imaging studies  CBC: Recent Labs  Lab 01/24/18 2043 01/25/18 0409 01/26/18 0522  WBC 20.4* 30.1* 21.1*  NEUTROABS 19.2* 28.0*  --   HGB 13.2 11.7* 11.1*  HCT 41.4 38.0 37.4  MCV 95.2 96.9 99.7  PLT 479* 405* 351   Basic Metabolic Panel: Recent Labs  Lab 01/24/18 2043 01/25/18 0409 01/26/18 0522  NA 145 149* 154*  K 4.7 4.2 3.9  CL 113* 116* 121*  CO2 23 27 24   GLUCOSE 103* 118* 88  BUN 60* 54* 39*  CREATININE 1.12* 1.11* 0.99  CALCIUM 8.8* 8.3* 7.9*   GFR: CrCl cannot be calculated (Unknown ideal weight.). Liver Function Tests: Recent Labs  Lab 01/24/18 2043 01/25/18 0409  AST 22 19  ALT 17 15  ALKPHOS 106 97  BILITOT 0.9 0.6  PROT 6.5 5.6*  ALBUMIN 2.3* 2.0*   No results for input(s): LIPASE, AMYLASE in the last 168 hours. No results for input(s): AMMONIA in the last 168  hours. Coagulation Profile: Recent Labs  Lab 01/25/18 0736  INR 1.08   Cardiac Enzymes: No results for input(s): CKTOTAL, CKMB, CKMBINDEX, TROPONINI in the last 168 hours. BNP (last 3 results) No results for input(s): PROBNP in the last 8760 hours. HbA1C: No results for input(s): HGBA1C in the last 72 hours. CBG: No results for input(s): GLUCAP in the last 168 hours. Lipid Profile: No results for input(s): CHOL, HDL, LDLCALC, TRIG, CHOLHDL, LDLDIRECT in the last 72 hours. Thyroid Function Tests: No results for input(s): TSH, T4TOTAL, FREET4, T3FREE, THYROIDAB in the last 72 hours. Anemia Panel: No results for input(s): VITAMINB12, FOLATE, FERRITIN, TIBC, IRON, RETICCTPCT in the last 72 hours. Sepsis Labs: Recent Labs  Lab 01/24/18 2041 01/24/18 2246  LATICACIDVEN 2.29* 1.82    Recent Results (from the past 240 hour(s))  Blood Culture (  routine x 2)     Status: None (Preliminary result)   Collection Time: 01/24/18  8:43 PM  Result Value Ref Range Status   Specimen Description   Final    BLOOD RIGHT FOREARM Performed at [facility], [address] [place], [place] [address]    Special Requests   Final    BOTTLES DRAWN AEROBIC AND ANAEROBIC Blood Culture adequate volume Performed at [facility], [address], [place], [place] [address]    Culture   Final    NO GROWTH 2 DAYS Performed at [facility], [address] [place], [place] [address]    Report Status PENDING  Incomplete  Blood Culture (routine x 2)     Status: None (Preliminary result)   Collection Time: 01/24/18  9:00 PM  Result Value Ref Range Status   Specimen Description   Final    BLOOD LEFT ARM Performed at [facility], [address], [place], [place] [address]    Special Requests   Final    BOTTLES DRAWN AEROBIC AND ANAEROBIC Blood Culture adequate volume Performed at [facility], [address],  [place], [place] [address]    Culture   Final    NO GROWTH 2 DAYS Performed at [facility], [address], [place], [place] [address]    Report Status PENDING  Incomplete  Urine Culture     Status: Abnormal (Preliminary result)   Collection Time: 01/24/18  9:40 PM  Result Value Ref Range Status   Specimen Description   Final    URINE, CATHETERIZED Performed at [facility], [address], [place], [place] [address]    Special Requests   Final    NONE Performed at [facility], [address], [place], [place] [address]    Culture >=100,000 COLONIES/mL ESCHERICHIA COLI (A)  Final   Report Status PENDING  Incomplete  MRSA PCR Screening     Status: None   Collection Time: 01/25/18  3:49 AM  Result Value Ref Range Status   MRSA by PCR NEGATIVE NEGATIVE Final    Comment:        The GeneXpert MRSA Assay (FDA approved for NASAL specimens only), is one component of a comprehensive MRSA colonization surveillance program. It is not intended to diagnose MRSA infection nor to guide or monitor treatment for MRSA infections. Performed at [facility], [address], [place], [place] [address]   Aerobic/Anaerobic Culture (surgical/deep wound)     Status: None (Preliminary result)   Collection Time: 01/25/18  2:40 PM  Result Value Ref Range Status   Specimen Description   Final    WOUND Performed at [facility], [address], [place], [place] [address]    Special Requests R ISCHIAL DECUBITUS  Final   Gram Stain   Final    NO WBC SEEN ABUNDANT GRAM NEGATIVE RODS ABUNDANT GRAM POSITIVE COCCI IN PAIRS IN CLUSTERS FEW GRAM POSITIVE RODS    Culture   Final    CULTURE REINCUBATED FOR BETTER GROWTH Performed at [facility], [address] [place], [place] [address]    Report Status PENDING  Incomplete    Radiology Studies: Ct Pelvis W Contrast  Result Date: 01/25/2018 CLINICAL DATA:   Infection, sepsis, urinary tract infection, pressure ulcer. EXAM: CT PELVIS WITH CONTRAST TECHNIQUE: Multidetector CT imaging of the pelvis was performed using the standard protocol following the bolus  administration of intravenous contrast. CONTRAST:  75mL OMNIPAQUE IOHEXOL 300 MG/ML  SOLN COMPARISON:  Pelvic radiograph August 20, 2016 FINDINGS: Urinary Tract:  Decompressed by Foley catheter. Bowel: RIGHT lower quadrant small bowel wall thickening and hyperenhancement. Included small and large bowel are normal in course and caliber. Vascular/Lymphatic: Severe calcific atherosclerosis. Mildly ectatic infrarenal aorta. No lymphadenopathy. Reproductive: Uterus not discretely identified, potentially obscured by hardware artifact. Other: 7.5 x 7.7 x 6.3 cm focal fluid or cyst without rim enhancement in presacral fat. Musculoskeletal: RIGHT gluteal 2 proximal femoral subcutaneous fat stranding with subcutaneous gas contiguous with ischial tuberosity which is intact. Effaced RIGHT hamstring fat planes and suspected fluid collection. Focal cortical dehiscence medial proximal femur (series 4, image 126) status post LEFT hip hemiarthroplasty and RIGHT femoral neck pinning with intramedullary rod. Osteopenia. IMPRESSION: 1. RIGHT gluteal necrotizing fasciitis with subcutaneous gas and myositis. Possible RIGHT hamstring intramuscular abscess though limited by hardware artifact. 2. Focal cortical dehiscence proximal RIGHT femur most compatible with osteomyelitis. 3. Partially imaged enteritis without bowel obstruction. 4. Nonspecific 7.5 x 7.7 x 6.3 cm cyst or fluid collection in pelvis. Electronically Signed   By: Awilda Metro M.D.   On: 01/25/2018 02:57   Dg Chest Portable 1 View  Result Date: 01/24/2018 CLINICAL DATA:  Altered level of consciousness EXAM: PORTABLE CHEST 1 VIEW COMPARISON:  07/04/2017 FINDINGS: No focal opacity or pleural effusion. Cardiomediastinal silhouette within normal limits. Aortic  atherosclerosis. No pneumothorax. Subacute to old left fourth rib fracture. IMPRESSION: No active disease. Electronically Signed   By: Jasmine Pang M.D.   On: 01/24/2018 20:27   Scheduled Meds: . cycloSPORINE  1 drop Both Eyes BID  . enoxaparin (LOVENOX) injection  30 mg Subcutaneous Q24H   Continuous Infusions: . sodium chloride Stopped (01/26/18 1727)  . dextrose 75 mL/hr at 01/26/18 1727  . piperacillin-tazobactam (ZOSYN)  IV 3.375 g (01/26/18 1727)  . vancomycin      LOS: 0 days   Merlene Laughter, DO [facility] PAGER is on AMION  If 7PM-7AM, please contact night-coverage [email or web address] Password [email or web address] 01/26/2018, 5:44 PM

## 2018-01-26 NOTE — Progress Notes (Signed)
Patient ID: [name] [name], female   DOB: [birth year], 84 y.o.   MRN: [id number]          [facility]    Date of Admission:  01/24/2018   Day 3 vancomycin        Day 3 piperacillin tazobactam  Ms. [name] [name] is a 84 year old with dementia.  She suffered a fall about 6 weeks ago and developed a small left periprosthetic hip infection.  She has been more bedridden since that time and developed a right hip decubitus ulcers leading to admission 2 days ago.  CT showed changes compatible with necrotizing fasciitis, possible right hamstring abscess and focal cortical dehiscence of the proximal right femur compatible with osteomyelitis.  She underwent debridement yesterday.  Operative Gram stain shows mixed bacterial flora and cultures are pending.  I favor continuing piperacillin tazobactam alone and will stop vancomycin now.  I would encourage a discussion with her family about goals of care.         Cliffton Asters, MD [facility] [phone] pager   [phone] cell 01/26/2018, 5:19 PM

## 2018-01-26 NOTE — Consult Note (Signed)
WOC Nurse wound consult note Reason for Consult:s/P surgical debridement of unstageable right ischial pressure injury.  Surgery has ordered NS moist kerlix to wound.  Will defer to their services.  Please reconsult if needed.   Wound type:Stage 4 pressure injury with palpable bone.  Pressure Injury POA: Yes Measurement: 8 cm x 7 cm x 3 cm per surgeon notes.  Wound bed: pale pink nongranulating Drainage (amount, consistency, odor) moderate serosanguinous with foul necrotic odor present.  Periwound:intact Dressing procedure/placement/frequency: Ns moist kerlix to wound bed twice daily.  Will not follow at this time.  Please re-consult if needed.  Maple Hudson MSN, RN, FNP-BC CWON Wound, Ostomy, Continence Nurse Pager [phone]

## 2018-01-26 NOTE — Significant Event (Signed)
Rapid Response Event Note  Overview: Time Called: 0623 Arrival Time: 0626 Event Type: Respiratory Rapid called to room for patient oxygen saturation reading at 30%.  Initial Focused Assessment: Patient lying in bed, responds to touch. HR 83, oxygen saturation initially not tracking on patient's finger. Pulse ox replaced on patient's ear, tracking well with a reading of 100% on 15L NRB. Oxygen weaned back down to [facility] and patient oxygen saturation maintained at 100%. Lung sounds clear in bilateral upper lobes.   Interventions: I requested RN placed patient on continuous oxygen saturation to ensure patient's oxygen saturations maintain above 93%.   Plan of Care (if not transferred):  Event Summary:  [name] [name]

## 2018-01-27 DIAGNOSIS — Z515 Encounter for palliative care: Secondary | ICD-10-CM

## 2018-01-27 DIAGNOSIS — M609 Myositis, unspecified: Secondary | ICD-10-CM

## 2018-01-27 DIAGNOSIS — L89319 Pressure ulcer of right buttock, unspecified stage: Secondary | ICD-10-CM

## 2018-01-27 DIAGNOSIS — F0390 Unspecified dementia without behavioral disturbance: Secondary | ICD-10-CM

## 2018-01-27 DIAGNOSIS — N390 Urinary tract infection, site not specified: Secondary | ICD-10-CM

## 2018-01-27 DIAGNOSIS — M869 Osteomyelitis, unspecified: Secondary | ICD-10-CM

## 2018-01-27 DIAGNOSIS — Z7189 Other specified counseling: Secondary | ICD-10-CM

## 2018-01-27 LAB — CBC WITH DIFFERENTIAL/PLATELET
BASOS ABS: 0 10*3/uL (ref 0.0–0.1)
Basophils Relative: 0 %
EOS PCT: 1 %
Eosinophils Absolute: 0.2 10*3/uL (ref 0.0–0.7)
HEMATOCRIT: 38.4 % (ref 36.0–46.0)
Hemoglobin: 11.5 g/dL — ABNORMAL LOW (ref 12.0–15.0)
LYMPHS PCT: 5 %
Lymphs Abs: 0.8 10*3/uL (ref 0.7–4.0)
MCH: 29.5 pg (ref 26.0–34.0)
MCHC: 29.9 g/dL — ABNORMAL LOW (ref 30.0–36.0)
MCV: 98.5 fL (ref 78.0–100.0)
Monocytes Absolute: 0.8 10*3/uL (ref 0.1–1.0)
Monocytes Relative: 6 %
NEUTROS ABS: 13 10*3/uL — AB (ref 1.7–7.7)
Neutrophils Relative %: 88 %
PLATELETS: 293 10*3/uL (ref 150–400)
RBC: 3.9 MIL/uL (ref 3.87–5.11)
RDW: 17.3 % — ABNORMAL HIGH (ref 11.5–15.5)
WBC: 14.8 10*3/uL — AB (ref 4.0–10.5)

## 2018-01-27 LAB — COMPREHENSIVE METABOLIC PANEL
ALBUMIN: 2.1 g/dL — AB (ref 3.5–5.0)
ALT: 20 U/L (ref 0–44)
AST: 37 U/L (ref 15–41)
Alkaline Phosphatase: 94 U/L (ref 38–126)
Anion gap: 8 (ref 5–15)
BUN: 31 mg/dL — ABNORMAL HIGH (ref 8–23)
CHLORIDE: 114 mmol/L — AB (ref 98–111)
CO2: 26 mmol/L (ref 22–32)
Calcium: 8 mg/dL — ABNORMAL LOW (ref 8.9–10.3)
Creatinine, Ser: 0.96 mg/dL (ref 0.44–1.00)
GFR calc Af Amer: 57 mL/min — ABNORMAL LOW (ref >60)
GFR, EST NON AFRICAN AMERICAN: 49 mL/min — AB (ref >60)
Glucose, Bld: 123 mg/dL — ABNORMAL HIGH (ref 70–99)
POTASSIUM: 3.3 mmol/L — AB (ref 3.5–5.1)
Sodium: 148 mmol/L — ABNORMAL HIGH (ref 135–145)
Total Bilirubin: 0.7 mg/dL (ref 0.3–1.2)
Total Protein: 5.3 g/dL — ABNORMAL LOW (ref 6.5–8.1)

## 2018-01-27 LAB — URINE CULTURE: Culture: >=100000 — AB

## 2018-01-27 LAB — MAGNESIUM: MAGNESIUM: 2 mg/dL (ref 1.7–2.4)

## 2018-01-27 LAB — PHOSPHORUS: PHOSPHORUS: 1.7 mg/dL — AB (ref 2.5–4.6)

## 2018-01-27 MED ORDER — POTASSIUM PHOSPHATES 15 MMOLE/5ML IV SOLN
20.0000 mmol | Freq: Once | INTRAVENOUS | Status: AC
  Administered 2018-01-27: 20 mmol via INTRAVENOUS
  Filled 2018-01-27: qty 6.67

## 2018-01-27 MED ORDER — POTASSIUM CHLORIDE 10 MEQ/100ML IV SOLN
10.0000 meq | INTRAVENOUS | Status: AC
  Administered 2018-01-27 (×4): 10 meq via INTRAVENOUS
  Filled 2018-01-27 (×4): qty 100

## 2018-01-27 MED ORDER — ENSURE ENLIVE PO LIQD
237.0000 mL | Freq: Two times a day (BID) | ORAL | Status: DC
  Administered 2018-01-28 – 2018-01-29 (×2): 237 mL via ORAL

## 2018-01-27 MED ORDER — ADULT MULTIVITAMIN W/MINERALS CH
1.0000 | ORAL_TABLET | Freq: Every day | ORAL | Status: DC
  Administered 2018-01-28 – 2018-01-29 (×2): 1 via ORAL
  Filled 2018-01-27 (×2): qty 1

## 2018-01-27 NOTE — Plan of Care (Signed)
Pt on CL diet

## 2018-01-27 NOTE — Progress Notes (Signed)
[facility]   Reason for visit: Follow up on osteomyelitis, myositis  Interval History: palliative care discussion with son started, getting hospice evaluation.  tmax 100.1, WBC down to 14.8.    Physical Exam: Constitutional:  Vitals:   01/27/18 0618 01/27/18 1347  BP:  (!) 118/59  Pulse:  97  Resp:  20  Temp: 98.2 F (36.8 C) 100.1 F (37.8 C)  SpO2:  100%   patient appears in nad Eyes: anicteric HENT: no thrush Respiratory: Normal respiratory effort; CTA B Cardiovascular: RRR GI: soft, nt, nd MS: no edema  Review of Systems: Unable to be assessed due to mental status She only replies that she has pain all over when asked, otherwise not obtainable  Lab Results  Component Value Date   WBC 14.8 (H) 01/27/2018   HGB 11.5 (L) 01/27/2018   HCT 38.4 01/27/2018   MCV 98.5 01/27/2018   PLT 293 01/27/2018    Lab Results  Component Value Date   CREATININE 0.96 01/27/2018   BUN 31 (H) 01/27/2018   NA 148 (H) 01/27/2018   K 3.3 (L) 01/27/2018   CL 114 (H) 01/27/2018   CO2 26 01/27/2018    Lab Results  Component Value Date   ALT 20 01/27/2018   AST 37 01/27/2018   ALKPHOS 94 01/27/2018     Microbiology: Recent Results (from the past 240 hour(s))  Blood Culture (routine x 2)     Status: None (Preliminary result)   Collection Time: 01/24/18  8:43 PM  Result Value Ref Range Status   Specimen Description   Final    BLOOD RIGHT FOREARM Performed at [facility], [address], [place], [place] [address]    Special Requests   Final    BOTTLES DRAWN AEROBIC AND ANAEROBIC Blood Culture adequate volume Performed at [facility], [address] [place], [place] [address]    Culture   Final    NO GROWTH 3 DAYS Performed at [facility], [address], [place], [place] [address]    Report Status PENDING  Incomplete  Blood Culture (routine x 2)     Status: None (Preliminary result)   Collection Time: 01/24/18  9:00 PM  Result Value Ref Range Status   Specimen Description   Final    BLOOD LEFT ARM Performed at [facility], [address] [place], [place] [address]    Special Requests   Final    BOTTLES DRAWN AEROBIC AND ANAEROBIC Blood Culture adequate volume Performed at [facility], [address], [place], [place] [address]    Culture   Final    NO GROWTH 3 DAYS Performed at [facility], [address], [place], [place] [address]    Report Status PENDING  Incomplete  Urine Culture     Status: Abnormal   Collection Time: 01/24/18  9:40 PM  Result Value Ref Range Status   Specimen Description   Final    URINE, CATHETERIZED Performed at [facility], [address] [place], [place] [address]    Special Requests   Final    NONE Performed at [facility], [address] [place], [place] [address]    Culture >=100,000 COLONIES/mL ESCHERICHIA COLI (A)  Final   Report Status 01/27/2018 FINAL  Final   Organism ID, Bacteria ESCHERICHIA COLI (A)  Final      Susceptibility   Escherichia coli - MIC*  AMPICILLIN 4 SENSITIVE Sensitive     CEFAZOLIN <=4 SENSITIVE Sensitive     CEFTRIAXONE <=1 SENSITIVE Sensitive     CIPROFLOXACIN <=0.25 SENSITIVE Sensitive     GENTAMICIN <=1 SENSITIVE Sensitive     IMIPENEM <=0.25 SENSITIVE Sensitive     NITROFURANTOIN <=16 SENSITIVE Sensitive     TRIMETH/SULFA <=20 SENSITIVE Sensitive     AMPICILLIN/SULBACTAM <=2 SENSITIVE Sensitive     PIP/TAZO <=4 SENSITIVE Sensitive     Extended ESBL NEGATIVE Sensitive     * >=100,000 COLONIES/mL ESCHERICHIA COLI  MRSA PCR Screening     Status: None   Collection Time: 01/25/18  3:49 AM  Result Value Ref Range Status   MRSA by PCR NEGATIVE NEGATIVE Final    Comment:        The GeneXpert MRSA Assay (FDA approved for NASAL specimens only), is one component of a comprehensive MRSA  colonization surveillance program. It is not intended to diagnose MRSA infection nor to guide or monitor treatment for MRSA infections. Performed at [facility], [address] [place], [place] [address]   Aerobic/Anaerobic Culture (surgical/deep wound)     Status: Abnormal (Preliminary result)   Collection Time: 01/25/18  2:40 PM  Result Value Ref Range Status   Specimen Description   Final    WOUND Performed at [facility], [address], [place], [place] [address]    Special Requests R ISCHIAL DECUBITUS  Final   Gram Stain   Final    NO WBC SEEN ABUNDANT GRAM NEGATIVE RODS ABUNDANT GRAM POSITIVE COCCI IN PAIRS IN CLUSTERS FEW GRAM POSITIVE RODS Performed at [facility], [address], [place], [place] [address]    Culture (A)  Final    MULTIPLE ORGANISMS PRESENT, NONE PREDOMINANT NO ANAEROBES ISOLATED; CULTURE IN PROGRESS FOR 5 DAYS    Report Status PENDING  Incomplete    Impression/Plan:  1. Osteomyelitis and myositis - on pip-tazo and has been debrided.  Will continue this for now pending discussion of hospice.    2.  Hospice - ongoing discussion noted and prognosis poor regardless.  No long term benefit to IV antibiotics with her underlying decline.  Agree with comfort measures  3.  UTI - unclear if infected based without known symptoms.  On appropriate therapy regardless.

## 2018-01-27 NOTE — Care Management Note (Signed)
Case Management Note  Patient Details  Name: [name] [name] MRN: [id number] Date of Birth: 1922-05-15  Subjective/Objective: Admitted w/Sepsis. From ALF[facility]. PT cons-await recc.                   Action/Plan:djc ALF   Expected Discharge Date:  01/29/18               Expected Discharge Plan:  Assisted Living / Rest Home  In-House Referral:  Clinical Social Work  Discharge planning Services  CM Consult  Post Acute Care Choice:    Choice offered to:     DME Arranged:    DME Agency:     HH Arranged:    HH Agency:     Status of Service:  In process, will continue to follow  If discussed at Long Length of Stay Meetings, dates discussed:    Additional Comments:  Lanier Clam, RN 01/27/2018, 12:23 PM

## 2018-01-27 NOTE — Evaluation (Signed)
Clinical/Bedside Swallow Evaluation Patient Details  Name: ERTHA NABOR MRN: [id number] Date of Birth: 01/10/1923  Today's Date: 01/27/2018 Time: SLP Start Time (ACUTE ONLY): 0919 SLP Stop Time (ACUTE ONLY): 0943 SLP Time Calculation (min) (ACUTE ONLY): 24 min  Past Medical History:  Past Medical History:  Diagnosis Date  . Arthritis   . Cataract   . Glaucoma    Past Surgical History:  Past Surgical History:  Procedure Laterality Date  . EYE SURGERY    . FEMUR IM NAIL Right 08/20/2016   Procedure: INTRAMEDULLARY (IM) NAIL FEMORAL RIGHT HIP;  Surgeon: Samson Frederic, MD;  Location: [facility];  Service: Orthopedics;  Laterality: Right;  . HIP ARTHROPLASTY Left 04/13/2016   Procedure: ARTHROPLASTY BIPOLAR HIP (HEMIARTHROPLASTY);  Surgeon: Beverely Low, MD;  Location: [facility];  Service: Orthopedics;  Laterality: Left;  . IRRIGATION AND DEBRIDEMENT ABSCESS Right 01/25/2018   Procedure: IRRIGATION AND DEBRIDEMENT ABSCESS- SACRAL DECUBITUS;  Surgeon: Gaynelle Adu, MD;  Location: [facility];  Service: General;  Laterality: Right;  . right hand surgery     HPI:  84 yo female adm to [facility] with sepsis from UTI and possible necrotizing fascitis with concern for osteomyelitis.  Pt with PMH + for dementia, is essentially bed found, cataracts, glaucome, pressure injury to right buttock.  Pt leaning toward her left side - suspect to decrease pressure on ulcer.  Swallow evalution ordered. Per notes, palliative referral pending.     Assessment / Plan / Recommendation Clinical Impression  Patient presents with cognitive based dysphagia with suspected exacerbation from weakness and environmental factors impacting swallow safety *(pt with wound, difficult to position upright).  No indication of airway compromise with po observed nor indication of residuals.  Pt did not masticate solids despite max cues increasing her aspiration risk with solids.  Recommend consider advancement to CREAMY puree/thin with strict  precautions.  Maximizing liquid intake will be most beneficial for this pt. If pt coughing with cup/straw, rec use spoon to decrease bolus size.  SLP will follow up x1 for family education regarding "comfort feeding" if this is deemed desired.  SLP Visit Diagnosis: Dysphagia, oral phase (R13.11)    Aspiration Risk  Mild aspiration risk(with precautions)    Diet Recommendation Dysphagia 1 (Puree);Thin liquid   Liquid Administration via: Cup;Straw;Spoon(tsp if pt coughing with straw/cup) Medication Administration: Crushed with puree Supervision: Full supervision/cueing for compensatory strategies;Comment(staff to feed pt if she can't feed herself, full supervision) Compensations: Slow rate;Small sips/bites Postural Changes: Remain upright for at least 30 minutes after po intake;Seated upright at 90 degrees    Other  Recommendations Oral Care Recommendations: Oral care before and after PO   Follow up Recommendations None      Frequency and Duration min 1 x/week  1 week       Prognosis Prognosis for Safe Diet Advancement: Fair Barriers to Reach Goals: Cognitive deficits      Swallow Study   General Date of Onset: 01/27/18 HPI: 84 yo female adm to [facility] with sepsis from UTI and possible necrotizing fascitis with concern for osteomyelitis.  Pt with PMH + for dementia, is essentially bed found, cataracts, glaucome, pressure injury to right buttock.  Pt leaning toward her left side - suspect to decrease pressure on ulcer.  Swallow evalution ordered. Per notes, palliative referral pending.   Type of Study: Bedside Swallow Evaluation Diet Prior to this Study: Thin liquids(clears) Temperature Spikes Noted: No Respiratory Status: Nasal cannula(2) History of Recent Intubation: No Behavior/Cognition: Alert;Cooperative;Other (Comment);Doesn't follow directions(pt has  dementia) Oral Care Completed by SLP: No Oral Cavity - Dentition: Adequate natural dentition Vision: (pt has mitts in place for  safety) Patient Positioning: Upright in bed(as upright as able given her wound and discomfort, SLP positioned head forward for maximal airway protection) Baseline Vocal Quality: Low vocal intensity Volitional Cough: Congested Volitional Swallow: Unable to elicit    Oral/Motor/Sensory Function Overall Oral Motor/Sensory Function: Generalized oral weakness(no focal cn deficits, generalized weakness noted)   Ice Chips Ice chips: Not tested   Thin Liquid Thin Liquid: Impaired Presentation: Spoon;Straw Oral Phase Impairments: Reduced labial seal    Nectar Thick Nectar Thick Liquid: Not tested   Honey Thick Honey Thick Liquid: Not tested   Puree Puree: Within functional limits Presentation: Spoon Oral Phase Impairments: Reduced labial seal   Solid     Solid: Impaired Oral Phase Impairments: Poor awareness of bolus;Reduced labial seal Oral Phase Functional Implications: Oral residue;Oral holding Other Comments: pt held bolus in anterior oral cavity without attempts to masticate and talking with bolus despite max cues - SLP removed after approximately 60 seconds       Chales Abrahams 01/27/2018,10:06 AM  Donavan Burnet, MS [facility] SLP Acute Rehab Services Pager [phone] Office [phone]

## 2018-01-27 NOTE — Progress Notes (Signed)
PROGRESS NOTE    [name] [name]  GYI:[id number] DOB: 1922-06-29 DOA: 01/24/2018 PCP: Velna Hatchet, MD   Brief Narrative:  The patient is a elderly thin frail and cachectic appearing Caucasian female with a past medical history significant for rheumatoid arthritis, dementia, cataracts, glaucoma, who is essentially bedbound from a skilled nursing facility at [facility] who presents with increasingly worse confusion and poor p.o. intake for last 3 days.  Patient had a fall 6 weeks ago when she had a periprosthetic hip fracture and was managed conservatively and subsequently developed sacral decubitus ulcers on her buttock.  She is admitted for a suspected UTI however the right buttock ulcer looked infected and a CT of the abdomen pelvis was done and it showed concern for necrotizing fasciitis with a right hamstring intramuscular abscess.  General surgery was consulted and did not feel that she had true necrotizing fasciitis however nonetheless there do not take the patient to the OR for debridement.  She also had a focal cortical dehiscence of the right proximal femur most compatible with osteomyelitis.  Infectious disease was consulted for further evaluation recommendations and have de-escalate antibiotics to just IV Zosyn.  Palliative care has been called for goals of care and SLP will be done for swallow evaluation.  Patient was placed on a stage I diet with thin liquids and palliative discussed with the healthcare power of attorney and they are leaning towards hospice and comfort care as opposed to going back to SNF with palliative.  Palliative has residential hospice to evaluate the patient.  For now we will continue antibiotics until further hospice discussions are done  Assessment & Plan:   Principal Problem:   Sepsis ([place]) Active Problems:   Rheumatoid arthritis ([place])   Dementia ([place])   Urinary tract infection without hematuria   Pressure injury of skin of right buttock   Encounter for  palliative care   Goals of care, counseling/discussion  Sepsis likely from UTI and and infected right buttock decubitus ulcer with ? Necrotizing Fascitis and Right Hamstring Intra Muscular Abscess and Assosciated Right Femur Osteomyelitis s/p I and D -Sepsis physiology is now improving; lactic acid is trended down -Urine indicative of a urinary tract infection as it showed cloudy appearance, amber color, large hemoglobin, large leukocytes, positive nitrites, many bacteria, greater than 50 WBCs, and positive urine culture with 100,000 colonies of E. Coli which is pansensitive -Patient has been placed on empiric antibiotics and de-escalate it just to Zosyn.   Will continue for now until further discussion about hospice is done.  If patient goes to hospice antibiotics will be stopped and per ID there is no long-term benefit to IV antibiotics with her underlying decline -ID has been formally consulted and recommending discontinuing vancomycin and this was done yesterday -Follow cultures.  -CT of the abdomen pelvis done and showed: "RIGHT gluteal necrotizing fasciitis with subcutaneous gas and myositis. Possible RIGHT hamstring intramuscular abscess though limited by hardware artifact. 2. Focal cortical dehiscence proximal RIGHT femur most compatible with osteomyelitis. 3. Partially imaged enteritis without bowel obstruction. 4. Nonspecific 7.5 x 7.7 x 6.3 cm cyst or fluid collection in pelvis." -General surgery consulted and evaluated and do not feel this is truly necrotizing fasciitis but nonetheless the patient for excisional debridement of the skin, soft tissue debridement of the right ischial decubitus ulcer -Wound nurse consulted for further evaluation -General surgery recommending hydrotherapy and will consider this if patient is not going to hospice -Patient is afebrile and white count is coming  down and went from 30,100 and is now 14,800 -Blood cultures x2 showed no growth at 3 days  -Right  ischial decubitus ulcer Gram stain shows  NO WBC SEEN, ABUNDANT GRAM NEGATIVE RODS,  ABUNDANT, GRAM POSITIVE COCCI IN PAIRS IN CLUSTERS, FEW GRAM POSITIVE RODS with multiple species present -We will continue take antibiotics as recommended by ID and have consulted palliative care goals of care discussion  Rheumatoid Arthritis  -Would hold methotrexate until patient sepsis improves.  Hypernatremia/hyperchloremia -Patient's sodium went from 145 and was elevated to 154; now patient's sodium 148 -Patient's chloride went from 113 and now elevated to 121; now sodiums 114 -IV fluids with normal saline have been stopped and patient was started on D5W at 75 mL's per hour -5 years continue to monitor and trend sodium level and repeat CMP in a.m.  Acute Renal Failure, improving -Patient's BUN/creatinine went from 60/1.12 and is now trended down to 31/0.96 with IV fluid hydration -Avoid nephrotoxic medications -To monitor and trend renal function and repeat CMP in a.m.  Acute Encephalopathy/Confusion and Poor Po Intake with Underlying Dementia in the setting of UTI and Gluteal pressure wound and femur osteomyelitis with ? Necrotizing Fasciitis and electrolyte abnormalities with type per natremia -More alert today after antibiotic treatment and debridement but still confused -Treat Hypernatremia and is improving -We will obtain SLP for formal Swallow Eval and recommending dysphagia 1 diet with thin liquids -Continue with antibiotics per ID for now  -Mentation is slightly improved so will hold off on Head CT -Palliative care discussions being held and patient likely to go to hospice and hoping for residential further discussion between palliative and family  Normocytic Anemia -Patient's hemoglobin/hematocrit went from 13.2/41.4 and is now 11.5/38.4 -In the setting of dilutional drop -Check anemia panel in the a.m. -Continue monitor for signs and symptoms of bleeding -Repeat CBC in  a.m.  Hypokalemia -Patient's potassium this morning was 3.3 -Replete with IV KCl 40 mEq and IV K-Phos 20 mmol -Continue monitor replete as necessary -Repeat CMP in AM  Hypophosphatemia -Patient's phosphorus level this morning is 1.7 -Replete with IV K-Phos 20 mmol -Continue monitor and replete as necessary -Repeat Phos level in a.m.  DVT prophylaxis: Enoxaparin 30 mg subcu Code Status: DO NOT RESUSCITATE  Family Communication: No family present at bedside Disposition Plan: SNF versus residential hospice and comfort care  Consultants:   General Surgery  Infectious Diseases  Palliative Care Medicine   Procedures:   Irrigation and debridement done by Dr. Greer Pickerel on 01/25/2018  Antimicrobials:  Anti-infectives (From admission, onward)   Start     Dose/Rate Route Frequency Ordered Stop   01/26/18 2200  vancomycin (VANCOCIN) 500 mg in sodium chloride 0.9 % 100 mL IVPB  Status:  Discontinued     500 mg 100 mL/hr over 60 Minutes Intravenous Every 48 hours 01/25/18 1018 01/26/18 1013   01/26/18 2200  vancomycin (VANCOCIN) 500 mg in sodium chloride 0.9 % 100 mL IVPB  Status:  Discontinued     500 mg 100 mL/hr over 60 Minutes Intravenous Every 48 hours 01/26/18 1024 01/26/18 1313   01/26/18 2000  vancomycin (VANCOCIN) 500 mg in sodium chloride 0.9 % 100 mL IVPB  Status:  Discontinued     500 mg 100 mL/hr over 60 Minutes Intravenous Every 36 hours 01/26/18 1313 01/26/18 1946   01/25/18 2200  ceFEPIme (MAXIPIME) 1 g in sodium chloride 0.9 % 100 mL IVPB  Status:  Discontinued     1 g 200 mL/hr  over 30 Minutes Intravenous Every 24 hours 01/25/18 0423 01/25/18 0859   01/25/18 1000  piperacillin-tazobactam (ZOSYN) IVPB 3.375 g     3.375 g 12.5 mL/hr over 240 Minutes Intravenous Every 8 hours 01/25/18 0917     01/25/18 0830  metroNIDAZOLE (FLAGYL) IVPB 500 mg  Status:  Discontinued     500 mg 100 mL/hr over 60 Minutes Intravenous Every 8 hours 01/25/18 0807 01/25/18 0859    01/25/18 0015  ceFEPIme (MAXIPIME) 1 g in sodium chloride 0.9 % 100 mL IVPB  Status:  Discontinued     1 g 200 mL/hr over 30 Minutes Intravenous Every 8 hours 01/25/18 0011 01/25/18 0422   01/24/18 2230  vancomycin (VANCOCIN) IVPB 1000 mg/200 mL premix     1,000 mg 200 mL/hr over 60 Minutes Intravenous  Once 01/24/18 2218 01/25/18 0002   01/24/18 2145  cefTRIAXone (ROCEPHIN) 1 g in sodium chloride 0.9 % 100 mL IVPB     1 g 200 mL/hr over 30 Minutes Intravenous  Once 01/24/18 2141 01/24/18 2242     Subjective: Seen and examined at bedside and was still encephalopathic but more interactive today.  I was unable to understand what she was fully saying.  Nursing reports no complaints overnight.  No chest pain, lightheadedness or dizziness.  Palliative met with family and the leading towards residential hospice with ongoing discussion.  Objective: Vitals:   01/27/18 0159 01/27/18 0617 01/27/18 0618 01/27/18 1347  BP: (!) 91/57 117/79  (!) 118/59  Pulse: 86 92  97  Resp: '12 12  20  '$ Temp: 97.9 F (36.6 C)  98.2 F (36.8 C) 100.1 F (37.8 C)  TempSrc: Oral  Oral Tympanic  SpO2: 98%   100%  Weight:      Height:        Intake/Output Summary (Last 24 hours) at 01/27/2018 1609 Last data filed at 01/27/2018 1401 Gross per 24 hour  Intake 3293.35 ml  Output 700 ml  Net 2593.35 ml   Filed Weights   01/25/18 2000  Weight: 40.8 kg   Examination: Physical Exam:  Constitutional: Thin, elderly, frail, cachectic appearing Caucasian female who is currently no acute distress appears calm but does appear uncomfortable Eyes: Lids and conjunctive are normal.  Sclera anicteric ENMT: External ears and nose appear normal.  Somewhat hard of hearing Neck: Appears supple no JVD Respiratory: Initial auscultation bilaterally.  No appreciable wheezing, rales, rhonchi.  Patient not tachypneic wheezing excess muscle breathe Cardiovascular: Regular rate and rhythm.  Has a 2 out of 6 systolic murmur.  Trace  lower extremity edema Abdomen: Soft, nontender, nondistended.  Bowel sounds present in 4 quadrants GU: Deferred Musculoskeletal: No clubbing or cyanosis.  No joint deformities noted Skin: Has a right ischial decubitus ulcer is covered Neurologic: Independently moves all extremities.  Cranial nerves II through XII grossly intact Psychiatric: Impaired judgment insight.  Patient is awake and alert and oriented x1   Data Reviewed: I have personally reviewed following labs and imaging studies  CBC: Recent Labs  Lab 01/24/18 2043 01/25/18 0409 01/26/18 0522 01/27/18 0551  WBC 20.4* 30.1* 21.1* 14.8*  NEUTROABS 19.2* 28.0*  --  13.0*  HGB 13.2 11.7* 11.1* 11.5*  HCT 41.4 38.0 37.4 38.4  MCV 95.2 96.9 99.7 98.5  PLT 479* 405* 351 527   Basic Metabolic Panel: Recent Labs  Lab 01/24/18 2043 01/25/18 0409 01/26/18 0522 01/27/18 0551  NA 145 149* 154* 148*  K 4.7 4.2 3.9 3.3*  CL 113* 116* 121* 114*  CO2 '23 27 24 26  '$ GLUCOSE 103* 118* 88 123*  BUN 60* 54* 39* 31*  CREATININE 1.12* 1.11* 0.99 0.96  CALCIUM 8.8* 8.3* 7.9* 8.0*  MG  --   --   --  2.0  PHOS  --   --   --  1.7*   GFR: Estimated Creatinine Clearance: 23.6 mL/min (by C-G formula based on SCr of 0.96 mg/dL). Liver Function Tests: Recent Labs  Lab 01/24/18 2043 01/25/18 0409 01/27/18 0551  AST 22 19 37  ALT '17 15 20  '$ ALKPHOS 106 97 94  BILITOT 0.9 0.6 0.7  PROT 6.5 5.6* 5.3*  ALBUMIN 2.3* 2.0* 2.1*   No results for input(s): LIPASE, AMYLASE in the last 168 hours. No results for input(s): AMMONIA in the last 168 hours. Coagulation Profile: Recent Labs  Lab 01/25/18 0736  INR 1.08   Cardiac Enzymes: No results for input(s): CKTOTAL, CKMB, CKMBINDEX, TROPONINI in the last 168 hours. BNP (last 3 results) No results for input(s): PROBNP in the last 8760 hours. HbA1C: No results for input(s): HGBA1C in the last 72 hours. CBG: No results for input(s): GLUCAP in the last 168 hours. Lipid Profile: No  results for input(s): CHOL, HDL, LDLCALC, TRIG, CHOLHDL, LDLDIRECT in the last 72 hours. Thyroid Function Tests: No results for input(s): TSH, T4TOTAL, FREET4, T3FREE, THYROIDAB in the last 72 hours. Anemia Panel: No results for input(s): VITAMINB12, FOLATE, FERRITIN, TIBC, IRON, RETICCTPCT in the last 72 hours. Sepsis Labs: Recent Labs  Lab 01/24/18 2041 01/24/18 2246  LATICACIDVEN 2.29* 1.82    Recent Results (from the past 240 hour(s))  Blood Culture (routine x 2)     Status: None (Preliminary result)   Collection Time: 01/24/18  8:43 PM  Result Value Ref Range Status   Specimen Description   Final    BLOOD RIGHT FOREARM Performed at [facility] [address] [place], [place] [address]    Special Requests   Final    BOTTLES DRAWN AEROBIC AND ANAEROBIC Blood Culture adequate volume Performed at [facility] [address] [place], [place] [address]    Culture   Final    NO GROWTH 3 DAYS Performed at [facility], [place] [address], [place], [place] [address]    Report Status PENDING  Incomplete  Blood Culture (routine x 2)     Status: None (Preliminary result)   Collection Time: 01/24/18  9:00 PM  Result Value Ref Range Status   Specimen Description   Final    BLOOD LEFT ARM Performed at [facility] [address] [place], [place] [address]    Special Requests   Final    BOTTLES DRAWN AEROBIC AND ANAEROBIC Blood Culture adequate volume Performed at [facility] [address], [place], [place] [address]    Culture   Final    NO GROWTH 3 DAYS Performed at [facility], [place] [address], [place], [place] [address]    Report Status PENDING  Incomplete  Urine Culture     Status: Abnormal   Collection Time: 01/24/18  9:40 PM  Result Value Ref Range Status   Specimen Description   Final    URINE, CATHETERIZED Performed at [facility] [address],  [place], [place] [address]    Special Requests   Final    NONE Performed at [facility], [place] [address] [place], [place] [address]    Culture >=100,000 COLONIES/mL  ESCHERICHIA COLI (A)  Final   Report Status 01/27/2018 FINAL  Final   Organism ID, Bacteria ESCHERICHIA COLI (A)  Final      Susceptibility   Escherichia coli - MIC*    AMPICILLIN 4 SENSITIVE Sensitive     CEFAZOLIN <=4 SENSITIVE Sensitive     CEFTRIAXONE <=1 SENSITIVE Sensitive     CIPROFLOXACIN <=0.25 SENSITIVE Sensitive     GENTAMICIN <=1 SENSITIVE Sensitive     IMIPENEM <=0.25 SENSITIVE Sensitive     NITROFURANTOIN <=16 SENSITIVE Sensitive     TRIMETH/SULFA <=20 SENSITIVE Sensitive     AMPICILLIN/SULBACTAM <=2 SENSITIVE Sensitive     PIP/TAZO <=4 SENSITIVE Sensitive     Extended ESBL NEGATIVE Sensitive     * >=100,000 COLONIES/mL ESCHERICHIA COLI  MRSA PCR Screening     Status: None   Collection Time: 01/25/18  3:49 AM  Result Value Ref Range Status   MRSA by PCR NEGATIVE NEGATIVE Final    Comment:        The GeneXpert MRSA Assay (FDA approved for NASAL specimens only), is one component of a comprehensive MRSA colonization surveillance program. It is not intended to diagnose MRSA infection nor to guide or monitor treatment for MRSA infections. Performed at [facility] [place] [address], [place], [place] [address]   Aerobic/Anaerobic Culture (surgical/deep wound)     Status: Abnormal (Preliminary result)   Collection Time: 01/25/18  2:40 PM  Result Value Ref Range Status   Specimen Description   Final    WOUND Performed at [facility] [address] [place], [place] [address]    Special Requests R ISCHIAL DECUBITUS  Final   Gram Stain   Final    NO WBC SEEN ABUNDANT GRAM NEGATIVE RODS ABUNDANT GRAM POSITIVE COCCI IN PAIRS IN CLUSTERS FEW GRAM POSITIVE RODS Performed at [facility], [address], [place], [place] [address]    Culture (A)   Final    MULTIPLE ORGANISMS PRESENT, NONE PREDOMINANT NO ANAEROBES ISOLATED; CULTURE IN PROGRESS FOR 5 DAYS    Report Status PENDING  Incomplete    Radiology Studies: No results found. Scheduled Meds: . cycloSPORINE  1 drop Both Eyes BID  . enoxaparin (LOVENOX) injection  30 mg Subcutaneous Q24H  . feeding supplement (ENSURE ENLIVE)  237 mL Oral BID BM  . multivitamin with minerals  1 tablet Oral Daily   Continuous Infusions: . sodium chloride Stopped (01/27/18 1515)  . dextrose 75 mL/hr at 01/27/18 0914  . piperacillin-tazobactam (ZOSYN)  IV 3.375 g (01/27/18 0913)  . potassium PHOSPHATE IVPB (in mmol) 20 mmol (01/27/18 1515)    LOS: 1 day   Kerney Elbe, DO [facility] PAGER is on [place]  If 7PM-7AM, please contact night-coverage [email or web address] Password [email or web address] 01/27/2018, 4:09 PM

## 2018-01-27 NOTE — Plan of Care (Signed)
Pt taking in more nourishment this shift

## 2018-01-27 NOTE — Progress Notes (Signed)
Initial Nutrition Assessment  DOCUMENTATION CODES:   Underweight(Will assess for malnutrition at follow-up)  INTERVENTION:  - Will order Ensure Enlive BID, each supplement provides 350 kcal and 20 grams of protein. - Will order Magic Cup BID with meals, each supplement provides 290 kcal and 9 grams of protein. - Will order daily multivitamin with minerals. - Tech/RN to feed patient. - Continue to encourage PO intakes.    NUTRITION DIAGNOSIS:   Inadequate oral intake related to acute illness, lethargy/confusion as evidenced by meal completion < 25%.  GOAL:   Patient will meet greater than or equal to 90% of their needs  MONITOR:   PO intake, Supplement acceptance, Weight trends, Labs  REASON FOR ASSESSMENT:   Malnutrition Screening Tool  ASSESSMENT:   84 year-old female with past medical history significant for rheumatoid arthritis, dementia, cataracts, glaucoma, essentially bedbound and lives at [facility] She presented to the ED with increasingly worse confusion and poor p.o. intake for 3 days.  Patient had a fall 6 weeks ago with associated hip fracture and was managed conservatively and subsequently developed sacral decubitus ulcers. She is admitted for a suspected UTI however the right buttock ulcer looked infected and a CT of the abdomen pelvis was done and it showed concern for necrotizing fasciitis with R hamstring intramuscular abscess. She also had a focal cortical dehiscence of the right proximal femur most compatible with osteomyelitis. Palliative care has been called for goals of care and SLP for swallow evaluation.   No intakes documented since admission. Patient advanced from NPO to CLD on 9/29 at 4:56 PM. SLP performed beside swallow evaluation earlier today and diet subsequently advanced to Dysphagia 1, thin liquids. Patient noted to be a/o to self only. She was sleeping at time of RD visit, no family/visitors present. RN reported that patient ate a few  bites only of breakfast.   Defer NFPE until patient is awake or family is present. Per chart review, weight on 3/8 was 100 lb and weight on 9/7 was 98 lb. Weight at time of admission noted to be 40.8 kg (90 lb) but unsure if this was a scaled or stated weight; will monitor weight trends closely.    Medications reviewed; 20 mmol IV KPhos x1 run today. Labs reviewed; Na; 148 mmol/L, K: 3.3 mmol/L, Cl: 114 mmol/L, BUN: 31 mg/dL, Ca: 8 mg/dL, Phos: 1.7 mg/dL, GFR: 49 mL/min. IVF; D5 @ 75 mL/hr (306 kcal).    NUTRITION - FOCUSED PHYSICAL EXAM:  Unable to complete; will attempt at follow-up.   Diet Order:   Diet Order            DIET - DYS 1 Room service appropriate? No; Fluid consistency: Thin  Diet effective now              EDUCATION NEEDS:   No education needs have been identified at this time  Skin:  Skin Assessment: Skin Integrity Issues: Skin Integrity Issues:: Unstageable, Stage II Stage II: sacrum Unstageable: full thickness to R IT  Last BM:  PTA/unknown  Height:   Ht Readings from Last 1 Encounters:  01/25/18 5\' 2"  (1.575 m)    Weight:   Wt Readings from Last 1 Encounters:  01/25/18 40.8 kg    Ideal Body Weight:  50 kg  BMI:  Body mass index is 16.46 kg/m.  Estimated Nutritional Needs:   Kcal:  1430-1630 (35-40 kcal/kg)  Protein:  60-73 grams (1.5-1.8 grams/kg)  Fluid:  >/= 1.6 L/day  Jarome [place], MS, RD, LDN, [facility] Inpatient Clinical Dietitian Pager # [phone] After hours/weekend pager # [phone]

## 2018-01-27 NOTE — Consult Note (Signed)
Consultation Note Date: 01/27/2018   Patient Name: [name] [name]  DOB: October 21, 1922  MRN: [id number]  Age / Sex: 84 y.o., female  PCP: Alysia Penna, MD Referring Physician: Merlene Laughter, DO  Reason for Consultation: Establishing goals of care  HPI/Patient Profile: 84 y.o. female admitted on 01/24/2018    Clinical Assessment and Goals of Care:  84 year old lady from [facility] unit, history of dementia rheumatoid arthritis cataracts glaucoma who is essentially bed bound, presented with worsening confusion and poor by mouth intake. Past medical history also significant for the fact that the patient had a fall 6 weeks ago and sustained peri prosthetic hip fracture that was managed conservatively.   Patient also noted to have sacral decubitus ulcer on her buttock/right ischium. She has been admitted to hospital medicine service for suspected urinary tract infection. Abdominal imaging at the time of admission was concerning for possible necrotizing fasciitis with right hamstring intramuscular abscess. Patient has been seen and evaluated by general surgery as well as infectious disease and speech therapists. She underwent debridement and now remains admitted to the hospital medicine service. Palliative consultation for goals of care discussions has been requested.  Patient is a frail weak elderly lady resting in bed. She is able to open her eyes, she is awake and reasonably alert. She is not able to provide much of a history or participate in discussions/decision making.  Call placed and discussed with son all of her at [phone]. Brief life review performed. He states that the patient has had gradual progressive decline and diminishing quality of life for some time now. Hence, a hospice consultation was requested at [facility]. Son states that they saw the patient recently however at that  time the patient was not deemed an appropriate candidate for hospice. Son states that the patient has continued to decline. She is nonambulatory. Her oral intake has been minimal and continues to decline.  Son is appreciative of all the information he has received from hospital staff about the patient's current hospitalization. He is able to understand and recall that the patient has been admitted for urinary tract infection and has also undergone evaluation for infected right buttock decubitus ulcer. He understands that she remains at high risk of ongoing decline.  Goals wishes and values discussed. We discussed about hospice philosophy of care, more specifically, the type of care that can be provided in a residential hospice setting. Discussed with him that the focus of care inside her residential hospice setting is for comfort measures. Typically, extended antibiotics and extended wound care is not offered over there.  In contrast, the type of care that can be offered in a skilled nursing facility rehabilitation attempt with continued antibiotics and wound care dressings also discussed in detail. Discussed about having a palliative services follow-up on the patient if she goes out to a skilled nursing facility for rehabilitation.  Both of these options discussed in detail. Answered all of the patient's son's questions to the best of my ability. With regards to  sending the patient to skilled nursing facility for wound care and physical therapy, the son states that it might be a lot to put the patient through that at the age of 67. He would like for hospice to reevaluate the patient.  Please note additional discussions/recommendations as listed below. Thank you for the consult.  HCPOA  son [name] [name] [phone]  SUMMARY OF RECOMMENDATIONS    DNR DNI CSW consult for residential hospice.  Ongoing discussions with son about the differences in care with residential hospice versus SNF rehab with  palliative/wound care/chronic Antibiotics. He'd like to discuss with hospice first.  Thank you for the consult.   Code Status/Advance Care Planning:  DNR    Symptom Management:    continue current mode of care.   Palliative Prophylaxis:   Delirium Protocol   Psycho-social/Spiritual:   Desire for further Chaplaincy support:yes  Additional Recommendations: Education on Hospice  Prognosis:   Guarded, could be 2-3 weeks at the most. She remains at high risk for prognosis being much shorter, if she becomes septic/declines further.   Discharge Planning: Hospice facility      Primary Diagnoses: Present on Admission: . Sepsis (HCC) . Rheumatoid arthritis (HCC) . Dementia (HCC)   I have reviewed the medical record, interviewed the patient and family, and examined the patient. The following aspects are pertinent.  Past Medical History:  Diagnosis Date  . Arthritis   . Cataract   . Glaucoma    Social History   Socioeconomic History  . Marital status: Widowed    Spouse name: Not on file  . Number of children: Not on file  . Years of education: Not on file  . Highest education level: Not on file  Occupational History  . Not on file  Social Needs  . Financial resource strain: Not on file  . Food insecurity:    Worry: Not on file    Inability: Not on file  . Transportation needs:    Medical: Not on file    Non-medical: Not on file  Tobacco Use  . Smoking status: Former Smoker    Packs/day: 0.50    Types: Cigarettes    Start date: 06/28/1942    Last attempt to quit: 04/13/2016    Years since quitting: 1.7  . Smokeless tobacco: Never Used  . Tobacco comment: Stopped for a total of 2 years  Substance and Sexual Activity  . Alcohol use: No  . Drug use: No  . Sexual activity: Not on file  Lifestyle  . Physical activity:    Days per week: Not on file    Minutes per session: Not on file  . Stress: Not on file  Relationships  . Social connections:    Talks  on phone: Not on file    Gets together: Not on file    Attends religious service: Not on file    Active member of club or organization: Not on file    Attends meetings of clubs or organizations: Not on file    Relationship status: Not on file  Other Topics Concern  . Not on file  Social History Narrative   [facility] (07/01/16):   Originally from [facility]. 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 No pets currently. She lives by herself at a condo.    Family History  Problem Relation Age of Onset  . Stroke Father   . Stroke Sister   . Anemia Sister   . Lung disease Neg Hx  Scheduled Meds: . cycloSPORINE  1 drop Both Eyes BID  . enoxaparin (LOVENOX) injection  30 mg Subcutaneous Q24H   Continuous Infusions: . sodium chloride 250 mL (01/27/18 0915)  . dextrose 75 mL/hr at 01/27/18 0914  . piperacillin-tazobactam (ZOSYN)  IV 3.375 g (01/27/18 0913)  . potassium chloride 10 mEq (01/27/18 1231)  . potassium PHOSPHATE IVPB (in mmol) Stopped (01/27/18 0917)   PRN Meds:.sodium chloride, acetaminophen **OR** acetaminophen, albuterol, morphine injection, ondansetron **OR** ondansetron (ZOFRAN) IV, traMADol Medications Prior to Admission:  Prior to Admission medications   Medication Sig Start Date End Date Taking? Authorizing Provider  acetaminophen (TYLENOL) 650 MG CR tablet Take 650 mg by mouth 3 (three) times daily.   Yes [provider]  albuterol (PROVENTIL) (2.5 MG/3ML) 0.083% nebulizer solution Take 2.5 mg by nebulization every 6 (six) hours as needed for wheezing or shortness of breath.   Yes [provider]  aspirin 81 MG chewable tablet Chew 81 mg by mouth daily.    Yes [provider]  Cholecalciferol (VITAMIN D3) 2000 units capsule Take 2,000 Units by mouth daily.   Yes [provider]  cycloSPORINE (RESTASIS) 0.05 % ophthalmic emulsion Place 1 drop into both eyes 2 (two) times daily.   Yes [provider]  diclofenac sodium (VOLTAREN) 1 % GEL Apply 4 g topically 2 (two) times daily.   Yes [provider]  folic acid (FOLVITE) 1 MG tablet Take 1 mg by mouth daily.  05/13/13  Yes [provider]  furosemide (LASIX) 20 MG tablet Take 20 mg by mouth 3 (three) times a week.   Yes [provider]  lidocaine (LMX) 4 % cream Apply 1 application topically daily. Apply to left thigh and leg   Yes [provider]  LORazepam (ATIVAN) 0.5 MG tablet Take 0.25 mg by mouth 2 (two) times daily as needed for anxiety.   Yes [provider]  Melatonin 5 MG TABS Take 5 mg by mouth at bedtime.   Yes [provider]  methotrexate (RHEUMATREX) 2.5 MG tablet Take 20 mg by mouth once a week. On Wednesday.   Yes [provider]  mirtazapine (REMERON) 15 MG tablet Take 15 mg by mouth at bedtime.   Yes [provider]  neomycin-bacitracin-polymyxin (NEOSPORIN) [phone] ointment Apply 1 application topically daily. Apply to nail bed of affected left toe prior to placing socks in the morning.   Yes [provider]  nitrofurantoin, macrocrystal-monohydrate, (MACROBID) 100 MG capsule Take 100 mg by mouth 2 (two) times daily.   Yes [provider]  NON FORMULARY Apply 1 application topically 2 (two) times daily. Vitamin K Lotion, Apply to legs   Yes [provider]  OLANZapine (ZYPREXA) 2.5 MG tablet Take 1.25 mg by mouth daily.   Yes [provider]  olopatadine (PATANOL) 0.1 % ophthalmic solution Place 2 drops into both eyes daily as needed for allergies.   Yes [provider]  ondansetron (ZOFRAN) 4 MG tablet Take 4 mg by mouth every 12 (twelve) hours as needed for nausea or vomiting.   Yes [provider]  potassium chloride (K-DUR,KLOR-CON) 10 MEQ tablet Take 10 mEq by mouth 3 (three) times a week.   Yes [provider]  traMADol (ULTRAM) 50 MG tablet Take 50 mg by mouth 2 (two) times daily.     Yes [provider]  traZODone (DESYREL) 50 MG tablet Take 50 mg by mouth at bedtime.   Yes [provider]  trolamine salicylate (ASPERCREME) 10 % cream Apply 1 application topically 2 (two) times daily. Apply from left ankle to hip   Yes [provider]  vitamin B-12 (CYANOCOBALAMIN) 1000 MCG tablet Take 1,000 mcg by mouth daily.   Yes [provider]  enoxaparin (LOVENOX) 30 MG/0.3ML injection Inject 0.3 mLs (30 mg total) into the skin daily. Patient not taking: Reported on 09/02/2016 08/23/16   TatOnalee Hua, MD  polyethylene glycol [facility] / Ethelene Hal) packet Take 17 g by mouth daily as needed for mild constipation. Patient not taking: Reported on 07/04/2017 08/22/16   Catarina Hartshorn, MD  senna (SENOKOT) 8.6 MG TABS tablet Take 1 tablet (8.6 mg total) by mouth 2 (two) times daily. Patient not taking: Reported on 07/04/2017 08/22/16   Catarina Hartshorn, MD   No Known Allergies Review of Systems +weak  Physical Exam Thin frail elderly lady Appears weak, chronically ill Has muscle wasting Images of R ischial decub ulcer noted on surgical colleagues documentation Wound care notes also reviewed Awakens easily S 1 S 2 Diminished breath sounds  Vital Signs: BP 117/79 (BP Location: Left Arm)   Pulse 92   Temp 98.2 F (36.8 C) (Oral)   Resp 12   Ht 5\' 2"  (1.575 m)   Wt 40.8 kg   SpO2 98%   BMI 16.46 kg/m  Pain Scale: PAINAD   Pain Score: 0-No pain(pt sleeping)   SpO2: SpO2: 98 % O2 Device:SpO2: 98 % O2 Flow Rate: .O2 Flow Rate (L/min): 2 L/min  IO: Intake/output summary:   Intake/Output Summary (Last 24 hours) at 01/27/2018 1323 Last data filed at 01/27/2018 0853 Gross per 24 hour  Intake 2199.28 ml  Output 500 ml  Net 1699.28 ml    LBM: Last BM Date: ([place]) Baseline Weight: Weight: 40.8 kg Most recent weight: Weight: 40.8 kg     Palliative Assessment/Data:   PPS 20%  Time In:  12 Time Out:  1310 Time Total:  70 min  Greater than 50%  of this  time was spent counseling and coordinating care related to the above assessment and plan.  Signed by: 07-13-2003, MD  [phone]  Please contact Palliative Medicine Team phone at [phone] for questions and concerns.  For individual provider: See [phone]

## 2018-01-28 DIAGNOSIS — M869 Osteomyelitis, unspecified: Secondary | ICD-10-CM | POA: Diagnosis present

## 2018-01-28 DIAGNOSIS — E8809 Other disorders of plasma-protein metabolism, not elsewhere classified: Secondary | ICD-10-CM | POA: Diagnosis present

## 2018-01-28 DIAGNOSIS — R627 Adult failure to thrive: Secondary | ICD-10-CM | POA: Diagnosis present

## 2018-01-28 DIAGNOSIS — R64 Cachexia: Secondary | ICD-10-CM

## 2018-01-28 DIAGNOSIS — R509 Fever, unspecified: Secondary | ICD-10-CM

## 2018-01-28 DIAGNOSIS — E43 Unspecified severe protein-calorie malnutrition: Secondary | ICD-10-CM

## 2018-01-28 DIAGNOSIS — M609 Myositis, unspecified: Secondary | ICD-10-CM | POA: Diagnosis present

## 2018-01-28 DIAGNOSIS — G9341 Metabolic encephalopathy: Secondary | ICD-10-CM

## 2018-01-28 DIAGNOSIS — R6520 Severe sepsis without septic shock: Secondary | ICD-10-CM

## 2018-01-28 DIAGNOSIS — D72829 Elevated white blood cell count, unspecified: Secondary | ICD-10-CM

## 2018-01-28 DIAGNOSIS — M60009 Infective myositis, unspecified site: Secondary | ICD-10-CM

## 2018-01-28 DIAGNOSIS — M8608 Acute hematogenous osteomyelitis, other sites: Secondary | ICD-10-CM

## 2018-01-28 LAB — CBC WITH DIFFERENTIAL/PLATELET
BASOS ABS: 0 10*3/uL (ref 0.0–0.1)
BASOS PCT: 0 %
EOS ABS: 0.2 10*3/uL (ref 0.0–0.7)
EOS PCT: 1 %
HCT: 35.1 % — ABNORMAL LOW (ref 36.0–46.0)
Hemoglobin: 10.7 g/dL — ABNORMAL LOW (ref 12.0–15.0)
Lymphocytes Relative: 7 %
Lymphs Abs: 1.1 10*3/uL (ref 0.7–4.0)
MCH: 29.2 pg (ref 26.0–34.0)
MCHC: 30.5 g/dL (ref 30.0–36.0)
MCV: 95.9 fL (ref 78.0–100.0)
MONO ABS: 1.3 10*3/uL — AB (ref 0.1–1.0)
Monocytes Relative: 9 %
Neutro Abs: 12 10*3/uL — ABNORMAL HIGH (ref 1.7–7.7)
Neutrophils Relative %: 83 %
PLATELETS: 260 10*3/uL (ref 150–400)
RBC: 3.66 MIL/uL — ABNORMAL LOW (ref 3.87–5.11)
RDW: 17 % — AB (ref 11.5–15.5)
WBC: 14.5 10*3/uL — ABNORMAL HIGH (ref 4.0–10.5)

## 2018-01-28 LAB — BLOOD CULTURE ID PANEL (REFLEXED)
ACINETOBACTER BAUMANNII: NOT DETECTED
CANDIDA ALBICANS: NOT DETECTED
CANDIDA GLABRATA: NOT DETECTED
CANDIDA TROPICALIS: NOT DETECTED
Candida krusei: NOT DETECTED
Candida parapsilosis: NOT DETECTED
ENTEROBACTER CLOACAE COMPLEX: NOT DETECTED
ENTEROBACTERIACEAE SPECIES: NOT DETECTED
ENTEROCOCCUS SPECIES: NOT DETECTED
Escherichia coli: NOT DETECTED
HAEMOPHILUS INFLUENZAE: NOT DETECTED
KLEBSIELLA PNEUMONIAE: NOT DETECTED
Klebsiella oxytoca: NOT DETECTED
LISTERIA MONOCYTOGENES: NOT DETECTED
Neisseria meningitidis: NOT DETECTED
Proteus species: NOT DETECTED
Pseudomonas aeruginosa: NOT DETECTED
STREPTOCOCCUS AGALACTIAE: NOT DETECTED
STREPTOCOCCUS PNEUMONIAE: NOT DETECTED
STREPTOCOCCUS PYOGENES: NOT DETECTED
STREPTOCOCCUS SPECIES: NOT DETECTED
Serratia marcescens: NOT DETECTED
Staphylococcus aureus (BCID): NOT DETECTED
Staphylococcus species: NOT DETECTED

## 2018-01-28 LAB — COMPREHENSIVE METABOLIC PANEL
ALBUMIN: 1.8 g/dL — AB (ref 3.5–5.0)
ALT: 21 U/L (ref 0–44)
ANION GAP: 9 (ref 5–15)
AST: 29 U/L (ref 15–41)
Alkaline Phosphatase: 91 U/L (ref 38–126)
BILIRUBIN TOTAL: 0.9 mg/dL (ref 0.3–1.2)
BUN: 20 mg/dL (ref 8–23)
CHLORIDE: 106 mmol/L (ref 98–111)
CO2: 23 mmol/L (ref 22–32)
Calcium: 7.6 mg/dL — ABNORMAL LOW (ref 8.9–10.3)
Creatinine, Ser: 0.83 mg/dL (ref 0.44–1.00)
GFR calc Af Amer: >60 mL/min (ref >60)
GFR calc non Af Amer: 59 mL/min — ABNORMAL LOW (ref >60)
Glucose, Bld: 100 mg/dL — ABNORMAL HIGH (ref 70–99)
POTASSIUM: 3.9 mmol/L (ref 3.5–5.1)
Sodium: 138 mmol/L (ref 135–145)
TOTAL PROTEIN: 4.9 g/dL — AB (ref 6.5–8.1)

## 2018-01-28 LAB — MAGNESIUM: MAGNESIUM: 1.7 mg/dL (ref 1.7–2.4)

## 2018-01-28 LAB — PHOSPHORUS: Phosphorus: 1.8 mg/dL — ABNORMAL LOW (ref 2.5–4.6)

## 2018-01-28 NOTE — Clinical Social Work Note (Signed)
Clinical Social Work Assessment  Patient Details  Name: [name] [name] MRN: [id number] Date of Birth: 1922/07/03  Date of referral:  01/28/18               Reason for consult:  End of Life/Hospice                Permission sought to share information with:    Permission granted to share information::     Name::        Agency::     Relationship::     Contact Information:     Housing/Transportation Living arrangements for the past 2 months:  Assisted Living Facility([facility]) Source of Information:  Adult Children(Son - Tania Ade) Patient Interpreter Needed:  None Criminal Activity/Legal Involvement Pertinent to Current Situation/Hospitalization:  No - Comment as needed Significant Relationships:  Adult Children Lives with:  Facility Resident Do you feel safe going back to the place where you live?  No Need for family participation in patient care:  Yes (Comment)  Care giving concerns:  Patient admitted from [facility]. Patient admitted with principal problem "sepsis". Palliative following and recommended residential hospice.    Social Worker assessment / plan:  CSW spoke with patient's son regarding consult for residential hospice. Patient's son reported that he was in agreement with patient discharging to residential hospice and preference is [facility]. CSW explained referral process and agreed to make referral.  CSW made referral to [facility] Carley Hammed.  CSW will continue to follow and assist with discharge planning.  Employment status:  Retired Database administrator PT Recommendations:  Not assessed at this time Information / Referral to community resources:  Other (Comment Required)([facility])  Patient/Family's Response to care:  Patient's son appreciative of CSW assistance with [facility] referral.   Patient/Family's Understanding of and Emotional Response to Diagnosis, Current Treatment, and Prognosis:   Patient's son involved in patient's care and verbalized understanding of patient's current treatment. Patient's son verbalized plan for patient to discharge to residential hospice.   Emotional Assessment Appearance:    Attitude/Demeanor/Rapport:  Unable to Assess Affect (typically observed):  Unable to Assess Orientation:  Oriented to Self Alcohol / Substance use:  Not Applicable Psych involvement (Current and /or in the community):  No (Comment)  Discharge Needs  Concerns to be addressed:  Care Coordination Readmission within the last 30 days:  No Current discharge risk:  Terminally ill Barriers to Discharge:  No Barriers Identified   Antionette Poles, LCSW 01/28/2018, 11:38 AM

## 2018-01-28 NOTE — Progress Notes (Signed)
PROGRESS NOTE    [name] [name]  ZOX:[id number] DOB: 28-Sep-1922 DOA: 01/24/2018 PCP: Alysia Penna, MD   Brief Narrative:  The patient is a elderly thin frail and cachectic appearing Caucasian female with a past medical history significant for rheumatoid arthritis, dementia, cataracts, glaucoma, who is essentially bedbound from a skilled nursing facility at [facility] who presents with increasingly worse confusion and poor p.o. intake for last 3 days.  Patient had a fall 6 weeks ago when she had a periprosthetic hip fracture and was managed conservatively and subsequently developed sacral decubitus ulcers on her buttock.  She is admitted for a suspected UTI however the right buttock ulcer looked infected and a CT of the abdomen pelvis was done and it showed concern for necrotizing fasciitis with a right hamstring intramuscular abscess.  General surgery was consulted and did not feel that she had true necrotizing fasciitis however nonetheless there do not take the patient to the OR for debridement.  She also had a focal cortical dehiscence of the right proximal femur most compatible with osteomyelitis.  Infectious disease was consulted for further evaluation recommendations and have de-escalate antibiotics to just IV Zosyn.  Palliative care has been called for goals of care and SLP will be done for swallow evaluation.  Patient was placed on a stage I diet with thin liquids and palliative discussed with the healthcare power of attorney and they are leaning towards hospice and comfort care as opposed to going back to SNF with palliative.  Palliative has residential hospice to evaluate the patient.  For now we will continue antibiotics until further hospice discussions are done  Assessment & Plan:   Principal Problem:   Sepsis (HCC) Active Problems:   Leukocytosis   Dementia (HCC)   Urinary tract infection without hematuria   Pressure injury of skin of right buttock   Encounter for palliative  care   Goals of care, counseling/discussion   Osteomyelitis (HCC)   Myositis   Fever   Cachexia (HCC)   Severe protein-calorie malnutrition (HCC)   Hypophosphatemia   Hypoalbuminemia   Failure to thrive in adult  Sepsis likely from UTI and and infected right buttock decubitus ulcer with ? Necrotizing Fascitis and Right Hamstring Intra Muscular Abscess and Assosciated Right Femur Osteomyelitis s/p I and D -Sepsis physiology is now improving; lactic acid is trended down -Urine indicative of a urinary tract infection as it showed cloudy appearance, amber color, large hemoglobin, large leukocytes, positive nitrites, many bacteria, greater than 50 WBCs, and positive urine culture with 100,000 colonies of E. Coli which is pansensitive -Patient has been placed on empiric antibiotics and de-escalate it just to Zosyn.   Will continue for now until further discussion about hospice is done.  If patient goes to hospice antibiotics will be stopped and per ID there is no long-term benefit to IV antibiotics with her underlying decline -ID has been formally consulted and recommending discontinuing vancomycin and this was done yesterday -Follow cultures.  -CT of the abdomen pelvis done and showed: "RIGHT gluteal necrotizing fasciitis with subcutaneous gas and myositis. Possible RIGHT hamstring intramuscular abscess though limited by hardware artifact. 2. Focal cortical dehiscence proximal RIGHT femur most compatible with osteomyelitis. 3. Partially imaged enteritis without bowel obstruction. 4. Nonspecific 7.5 x 7.7 x 6.3 cm cyst or fluid collection in pelvis." -General surgery consulted and evaluated and do not feel this is truly necrotizing fasciitis but nonetheless the patient for excisional debridement of the skin, soft tissue debridement of the right ischial  decubitus ulcer -Wound nurse consulted for further evaluation -General surgery recommending hydrotherapy and will consider this if patient is not going  to hospice -Patient is afebrile and white count is coming down and went from 30,100 and is now 14,800 -Blood cultures x2 showed no growth at 3 days  -Right ischial decubitus ulcer Gram stain shows  NO WBC SEEN, ABUNDANT GRAM NEGATIVE RODS,  ABUNDANT, GRAM POSITIVE COCCI IN PAIRS IN CLUSTERS, FEW GRAM POSITIVE RODS with multiple species present -We will continue take antibiotics as recommended by ID and have consulted palliative care goals of care discussion   Acute Encephalopathy/Confusion and Poor Po Intake with Underlying Dementia in the setting of UTI and Gluteal pressure wound and femur osteomyelitis with ? Necrotizing Fasciitis and electrolyte abnormalities with type per natremia -More alert today after antibiotic treatment and debridement but still confused -Treat Hypernatremia and is improving -We will obtain SLP for formal Swallow Eval and recommending dysphagia 1 diet with thin liquids -Continue with antibiotics per ID for now  -Mentation is slightly improved so will hold off on Head CT -Palliative care discussions being held and patient likely to go to hospice and hoping for residential further discussion between palliative and family  Normocytic Anemia -Patient's hemoglobin/hematocrit went from 13.2/41.4 and is now 11.5/38.4 -In the setting of dilutional drop -Check anemia panel in the a.m. -Continue monitor for signs and symptoms of bleeding -Repeat CBC in a.m.  Hypokalemia -resolved  Hypophosphatemia -Replete with IV K-Phos 20 mmol -Continue monitor and replete as necessary   DVT prophylaxis: Enoxaparin 30 mg subcu Code Status: DO NOT RESUSCITATE  Family Communication: No family present at bedside Disposition Plan: SNF versus residential hospice and comfort care  Consultants:   General Surgery  Infectious Diseases  Palliative Care Medicine   Procedures:   Irrigation and debridement done by Dr. Gaynelle Adu on 01/25/2018  Antimicrobials:  Anti-infectives  (From admission, onward)   Start     Dose/Rate Route Frequency Ordered Stop   01/26/18 2200  vancomycin (VANCOCIN) 500 mg in sodium chloride 0.9 % 100 mL IVPB  Status:  Discontinued     500 mg 100 mL/hr over 60 Minutes Intravenous Every 48 hours 01/25/18 1018 01/26/18 1013   01/26/18 2200  vancomycin (VANCOCIN) 500 mg in sodium chloride 0.9 % 100 mL IVPB  Status:  Discontinued     500 mg 100 mL/hr over 60 Minutes Intravenous Every 48 hours 01/26/18 1024 01/26/18 1313   01/26/18 2000  vancomycin (VANCOCIN) 500 mg in sodium chloride 0.9 % 100 mL IVPB  Status:  Discontinued     500 mg 100 mL/hr over 60 Minutes Intravenous Every 36 hours 01/26/18 1313 01/26/18 1946   01/25/18 2200  ceFEPIme (MAXIPIME) 1 g in sodium chloride 0.9 % 100 mL IVPB  Status:  Discontinued     1 g 200 mL/hr over 30 Minutes Intravenous Every 24 hours 01/25/18 0423 01/25/18 0859   01/25/18 1000  piperacillin-tazobactam (ZOSYN) IVPB 3.375 g     3.375 g 12.5 mL/hr over 240 Minutes Intravenous Every 8 hours 01/25/18 0917     01/25/18 0830  metroNIDAZOLE (FLAGYL) IVPB 500 mg  Status:  Discontinued     500 mg 100 mL/hr over 60 Minutes Intravenous Every 8 hours 01/25/18 0807 01/25/18 0859   01/25/18 0015  ceFEPIme (MAXIPIME) 1 g in sodium chloride 0.9 % 100 mL IVPB  Status:  Discontinued     1 g 200 mL/hr over 30 Minutes Intravenous Every 8 hours 01/25/18 0011 01/25/18  0422   01/24/18 2230  vancomycin (VANCOCIN) IVPB 1000 mg/200 mL premix     1,000 mg 200 mL/hr over 60 Minutes Intravenous  Once 01/24/18 2218 01/25/18 0002   01/24/18 2145  cefTRIAXone (ROCEPHIN) 1 g in sodium chloride 0.9 % 100 mL IVPB     1 g 200 mL/hr over 30 Minutes Intravenous  Once 01/24/18 2141 01/24/18 2242     Subjective: No complaints. Tm 100.29F. Wbc stable at 14.5. Pending confirmation from hospice house  Objective: Vitals:   01/27/18 2203 01/28/18 0607 01/28/18 1013 01/28/18 1317  BP: 126/69 111/61  (!) 101/58  Pulse: 91 93  76  Resp: 20  20  16   Temp: 99.3 F (37.4 C) 98.6 F (37 C)  98.4 F (36.9 C)  TempSrc: Oral Oral  Oral  SpO2: 98% 98%  94%  Weight:   42 kg   Height:        Intake/Output Summary (Last 24 hours) at 01/28/2018 1512 Last data filed at 01/28/2018 1610 Gross per 24 hour  Intake 2770.51 ml  Output 925 ml  Net 1845.51 ml   Filed Weights   01/25/18 2000 01/28/18 1013  Weight: 40.8 kg 42 kg   Examination: Physical Exam:  Constitutional: Thin, elderly, frail, cachectic appearing Caucasian female who is currently no acute distress appears calm but does appear uncomfortable Eyes: Lids and conjunctive are normal.  Sclera anicteric ENMT: External ears and nose appear normal.  Somewhat hard of hearing Neck: Appears supple no JVD Respiratory: Initial auscultation bilaterally.  No appreciable wheezing, rales, rhonchi.  Patient not tachypneic wheezing excess muscle breathe Cardiovascular: Regular rate and rhythm.  Has a 2 out of 6 systolic murmur.  Trace lower extremity edema Abdomen: Soft, nontender, nondistended.  Bowel sounds present in 4 quadrants GU: Deferred Musculoskeletal: No clubbing or cyanosis.  No joint deformities noted Skin: Has a right ischial decubitus ulcer is covered Neurologic: Independently moves all extremities.  Cranial nerves II through XII grossly intact Psychiatric: Impaired judgment insight.  Patient is awake and alert and oriented x1   Data Reviewed: I have personally reviewed following labs and imaging studies  CBC: Recent Labs  Lab 01/24/18 2043 01/25/18 0409 01/26/18 0522 01/27/18 0551 01/28/18 0457  WBC 20.4* 30.1* 21.1* 14.8* 14.5*  NEUTROABS 19.2* 28.0*  --  13.0* 12.0*  HGB 13.2 11.7* 11.1* 11.5* 10.7*  HCT 41.4 38.0 37.4 38.4 35.1*  MCV 95.2 96.9 99.7 98.5 95.9  PLT 479* 405* 351 293 260   Basic Metabolic Panel: Recent Labs  Lab 01/24/18 2043 01/25/18 0409 01/26/18 0522 01/27/18 0551 01/28/18 0457  NA 145 149* 154* 148* 138  K 4.7 4.2 3.9 3.3* 3.9   CL 113* 116* 121* 114* 106  CO2 23 27 24 26 23   GLUCOSE 103* 118* 88 123* 100*  BUN 60* 54* 39* 31* 20  CREATININE 1.12* 1.11* 0.99 0.96 0.83  CALCIUM 8.8* 8.3* 7.9* 8.0* 7.6*  MG  --   --   --  2.0 1.7  PHOS  --   --   --  1.7* 1.8*   GFR: Estimated Creatinine Clearance: 28.1 mL/min (by C-G formula based on SCr of 0.83 mg/dL). Liver Function Tests: Recent Labs  Lab 01/24/18 2043 01/25/18 0409 01/27/18 0551 01/28/18 0457  AST 22 19 37 29  ALT 17 15 20 21   ALKPHOS 106 97 94 91  BILITOT 0.9 0.6 0.7 0.9  PROT 6.5 5.6* 5.3* 4.9*  ALBUMIN 2.3* 2.0* 2.1* 1.8*   No results  for input(s): LIPASE, AMYLASE in the last 168 hours. No results for input(s): AMMONIA in the last 168 hours. Coagulation Profile: Recent Labs  Lab 01/25/18 0736  INR 1.08   Cardiac Enzymes: No results for input(s): CKTOTAL, CKMB, CKMBINDEX, TROPONINI in the last 168 hours. BNP (last 3 results) No results for input(s): PROBNP in the last 8760 hours. HbA1C: No results for input(s): HGBA1C in the last 72 hours. CBG: No results for input(s): GLUCAP in the last 168 hours. Lipid Profile: No results for input(s): CHOL, HDL, LDLCALC, TRIG, CHOLHDL, LDLDIRECT in the last 72 hours. Thyroid Function Tests: No results for input(s): TSH, T4TOTAL, FREET4, T3FREE, THYROIDAB in the last 72 hours. Anemia Panel: No results for input(s): VITAMINB12, FOLATE, FERRITIN, TIBC, IRON, RETICCTPCT in the last 72 hours. Sepsis Labs: Recent Labs  Lab 01/24/18 2041 01/24/18 2246  LATICACIDVEN 2.29* 1.82    Recent Results (from the past 240 hour(s))  Blood Culture (routine x 2)     Status: None (Preliminary result)   Collection Time: 01/24/18  8:43 PM  Result Value Ref Range Status   Specimen Description   Final    BLOOD RIGHT FOREARM Performed at [facility], [address], [place], [place] [address]    Special Requests   Final    BOTTLES DRAWN AEROBIC AND ANAEROBIC Blood Culture adequate  volume Performed at [facility], [address] [place], [place] [address]    Culture  Setup Time   Final    GRAM NEGATIVE RODS ANAEROBIC BOTTLE ONLY Organism ID to follow CRITICAL RESULT CALLED TO, READ BACK BY AND VERIFIED WITH: Fenton Foy PharmD 10:35 01/28/18 ([email or web address]) Performed at [facility], [address], [place], [place] [address]    Culture GRAM NEGATIVE RODS  Final   Report Status PENDING  Incomplete  Blood Culture ID Panel (Reflexed)     Status: None   Collection Time: 01/24/18  8:43 PM  Result Value Ref Range Status   Enterococcus species NOT DETECTED NOT DETECTED Final   Listeria monocytogenes NOT DETECTED NOT DETECTED Final   Staphylococcus species NOT DETECTED NOT DETECTED Final   Staphylococcus aureus NOT DETECTED NOT DETECTED Final   Streptococcus species NOT DETECTED NOT DETECTED Final   Streptococcus agalactiae NOT DETECTED NOT DETECTED Final   Streptococcus pneumoniae NOT DETECTED NOT DETECTED Final   Streptococcus pyogenes NOT DETECTED NOT DETECTED Final   Acinetobacter baumannii NOT DETECTED NOT DETECTED Final   Enterobacteriaceae species NOT DETECTED NOT DETECTED Final   Enterobacter cloacae complex NOT DETECTED NOT DETECTED Final   Escherichia coli NOT DETECTED NOT DETECTED Final   Klebsiella oxytoca NOT DETECTED NOT DETECTED Final   Klebsiella pneumoniae NOT DETECTED NOT DETECTED Final   Proteus species NOT DETECTED NOT DETECTED Final   Serratia marcescens NOT DETECTED NOT DETECTED Final   Haemophilus influenzae NOT DETECTED NOT DETECTED Final   Neisseria meningitidis NOT DETECTED NOT DETECTED Final   Pseudomonas aeruginosa NOT DETECTED NOT DETECTED Final   Candida albicans NOT DETECTED NOT DETECTED Final   Candida glabrata NOT DETECTED NOT DETECTED Final   Candida krusei NOT DETECTED NOT DETECTED Final   Candida parapsilosis NOT DETECTED NOT DETECTED Final   Candida tropicalis NOT DETECTED NOT DETECTED Final    Comment:  Performed at [facility], [address] [place], [place] [address]  Blood Culture (routine x 2)     Status: None (Preliminary result)   Collection Time: 01/24/18  9:00 PM  Result Value  Ref Range Status   Specimen Description   Final    BLOOD LEFT ARM Performed at [facility], [address], [place], [place] [address]    Special Requests   Final    BOTTLES DRAWN AEROBIC AND ANAEROBIC Blood Culture adequate volume Performed at [facility], [address] [place], [place] [address]    Culture   Final    NO GROWTH 4 DAYS Performed at [facility], [address], [place], [place] [address]    Report Status PENDING  Incomplete  Urine Culture     Status: Abnormal   Collection Time: 01/24/18  9:40 PM  Result Value Ref Range Status   Specimen Description   Final    URINE, CATHETERIZED Performed at [facility], [address], [place], [place] [address]    Special Requests   Final    NONE Performed at [facility], [address], [place], [place] [address]    Culture >=100,000 COLONIES/mL ESCHERICHIA COLI (A)  Final   Report Status 01/27/2018 FINAL  Final   Organism ID, Bacteria ESCHERICHIA COLI (A)  Final      Susceptibility   Escherichia coli - MIC*    AMPICILLIN 4 SENSITIVE Sensitive     CEFAZOLIN <=4 SENSITIVE Sensitive     CEFTRIAXONE <=1 SENSITIVE Sensitive     CIPROFLOXACIN <=0.25 SENSITIVE Sensitive     GENTAMICIN <=1 SENSITIVE Sensitive     IMIPENEM <=0.25 SENSITIVE Sensitive     NITROFURANTOIN <=16 SENSITIVE Sensitive     TRIMETH/SULFA <=20 SENSITIVE Sensitive     AMPICILLIN/SULBACTAM <=2 SENSITIVE Sensitive     PIP/TAZO <=4 SENSITIVE Sensitive     Extended ESBL NEGATIVE Sensitive     * >=100,000 COLONIES/mL ESCHERICHIA COLI  MRSA PCR Screening     Status: None   Collection Time: 01/25/18  3:49 AM  Result Value Ref Range Status   MRSA by PCR NEGATIVE NEGATIVE Final     Comment:        The GeneXpert MRSA Assay (FDA approved for NASAL specimens only), is one component of a comprehensive MRSA colonization surveillance program. It is not intended to diagnose MRSA infection nor to guide or monitor treatment for MRSA infections. Performed at [facility], [address] [place], [place] [address]   Aerobic/Anaerobic Culture (surgical/deep wound)     Status: Abnormal (Preliminary result)   Collection Time: 01/25/18  2:40 PM  Result Value Ref Range Status   Specimen Description   Final    WOUND Performed at [facility], [address] [place], [place] [address]    Special Requests R ISCHIAL DECUBITUS  Final   Gram Stain   Final    NO WBC SEEN ABUNDANT GRAM NEGATIVE RODS ABUNDANT GRAM POSITIVE COCCI IN PAIRS IN CLUSTERS FEW GRAM POSITIVE RODS Performed at [facility], [address] [place], [place] [address]    Culture (A)  Final    MULTIPLE ORGANISMS PRESENT, NONE PREDOMINANT NO ANAEROBES ISOLATED; CULTURE IN PROGRESS FOR 5 DAYS    Report Status PENDING  Incomplete    Radiology Studies: No results found. Scheduled Meds: . cycloSPORINE  1 drop Both Eyes BID  . enoxaparin (LOVENOX) injection  30 mg Subcutaneous Q24H  . feeding supplement (ENSURE ENLIVE)  237 mL Oral BID BM  . multivitamin with minerals  1 tablet Oral Daily   Continuous Infusions: . sodium chloride Stopped (01/27/18 1515)  . dextrose  75 mL/hr at 01/28/18 0630  . piperacillin-tazobactam (ZOSYN)  IV 3.375 g (01/28/18 0935)    LOS: 2 days   Gala Murdoch, DO [facility] PAGER is on AMION  If 7PM-7AM, please contact night-coverage [email or web address] Password [email or web address] 01/28/2018, 3:12 PM

## 2018-01-28 NOTE — Progress Notes (Signed)
[facility]  Received request from CSW [place] for family interest in [facility]. Chart reviewed. Appreciate Dr. Beatriz Stallion note.  Attempted contact with son. Await call back. Updated CSW.   Thank you,  Forrestine Him, LCSW [phone]

## 2018-01-28 NOTE — Progress Notes (Addendum)
3 Days Post-Op    CC:  Sacral decubitus  Subjective: Confused in [place] and sedated with pain meds.  We looked at the wound and picture is below. Some easy to debride tissue, some undermining, but no new area requiring surgical debridement.    Objective: Vital signs in last 24 hours: Temp:  [98.6 F (37 C)-100.1 F (37.8 C)] 98.6 F (37 C) (10/02 0607) Pulse Rate:  [91-97] 93 (10/02 0607) Resp:  [20] 20 (10/02 0607) BP: (111-126)/(59-69) 111/61 (10/02 0607) SpO2:  [98 %-100 %] 98 % (10/02 0607) Last BM Date: (unknown) 440 PO 3500 IV 1075 urine Fluid balance 2.9 liter positive - will check weight Labs stable WBC slightly better   Intake/Output from previous day: 10/01 0701 - 10/02 0700 In: 4019.6 [P.O.:440; I.V.:1831.5; IV Piggyback:1748.1] Out: 1075 [Urine:1075] Intake/Output this shift: Total I/O In: -  Out: 350 [Urine:350]  General appearance: confused and sedated in [place]. Skin: picture below   Some debridement at the bedside can handle the area at 9 O'clock in this picture.  She has some undermining at 4 o'clock and the red areas 12-6 o'clock in this picture.  Continue ongoing hydrotherapy and dressing changes.   Lab Results:  Recent Labs    01/27/18 0551 01/28/18 0457  WBC 14.8* 14.5*  HGB 11.5* 10.7*  HCT 38.4 35.1*  PLT 293 260    BMET Recent Labs    01/27/18 0551 01/28/18 0457  NA 148* 138  K 3.3* 3.9  CL 114* 106  CO2 26 23  GLUCOSE 123* 100*  BUN 31* 20  CREATININE 0.96 0.83  CALCIUM 8.0* 7.6*   PT/INR No results for input(s): LABPROT, INR in the last 72 hours.  Recent Labs  Lab 01/24/18 2043 01/25/18 0409 01/27/18 0551 01/28/18 0457  AST 22 19 37 29  ALT 17 15 20 21   ALKPHOS 106 97 94 91  BILITOT 0.9 0.6 0.7 0.9  PROT 6.5 5.6* 5.3* 4.9*  ALBUMIN 2.3* 2.0* 2.1* 1.8*     Lipase  No results found for: LIPASE   Medications: . cycloSPORINE  1 drop Both Eyes BID  . enoxaparin (LOVENOX) injection  30 mg Subcutaneous Q24H    . feeding supplement (ENSURE ENLIVE)  237 mL Oral BID BM  . multivitamin with minerals  1 tablet Oral Daily   . sodium chloride Stopped (01/27/18 1515)  . dextrose 75 mL/hr at 01/28/18 0630  . piperacillin-tazobactam (ZOSYN)  IV 3.375 g (01/28/18 0935)   Anti-infectives (From admission, onward)   Start     Dose/Rate Route Frequency Ordered Stop   01/26/18 2200  vancomycin (VANCOCIN) 500 mg in sodium chloride 0.9 % 100 mL IVPB  Status:  Discontinued     500 mg 100 mL/hr over 60 Minutes Intravenous Every 48 hours 01/25/18 1018 01/26/18 1013   01/26/18 2200  vancomycin (VANCOCIN) 500 mg in sodium chloride 0.9 % 100 mL IVPB  Status:  Discontinued     500 mg 100 mL/hr over 60 Minutes Intravenous Every 48 hours 01/26/18 1024 01/26/18 1313   01/26/18 2000  vancomycin (VANCOCIN) 500 mg in sodium chloride 0.9 % 100 mL IVPB  Status:  Discontinued     500 mg 100 mL/hr over 60 Minutes Intravenous Every 36 hours 01/26/18 1313 01/26/18 1946   01/25/18 2200  ceFEPIme (MAXIPIME) 1 g in sodium chloride 0.9 % 100 mL IVPB  Status:  Discontinued     1 g 200 mL/hr over 30 Minutes Intravenous Every 24 hours 01/25/18 0423 01/25/18  1829   01/25/18 1000  piperacillin-tazobactam (ZOSYN) IVPB 3.375 g     3.375 g 12.5 mL/hr over 240 Minutes Intravenous Every 8 hours 01/25/18 0917     01/25/18 0830  metroNIDAZOLE (FLAGYL) IVPB 500 mg  Status:  Discontinued     500 mg 100 mL/hr over 60 Minutes Intravenous Every 8 hours 01/25/18 0807 01/25/18 0859   01/25/18 0015  ceFEPIme (MAXIPIME) 1 g in sodium chloride 0.9 % 100 mL IVPB  Status:  Discontinued     1 g 200 mL/hr over 30 Minutes Intravenous Every 8 hours 01/25/18 0011 01/25/18 0422   01/24/18 2230  vancomycin (VANCOCIN) IVPB 1000 mg/200 mL premix     1,000 mg 200 mL/hr over 60 Minutes Intravenous  Once 01/24/18 2218 01/25/18 0002   01/24/18 2145  cefTRIAXone (ROCEPHIN) 1 g in sodium chloride 0.9 % 100 mL IVPB     1 g 200 mL/hr over 30 Minutes Intravenous   Once 01/24/18 2141 01/24/18 2242     Assessment/Plan   Sepsis    Rheumatoid arthritis    Dementia   Urinary tract infection without hematuria   Pressure injury of skin of right buttock  Sacral wound - S/P debridement Dr. Andrey Campanile, 09/29 - wound would benefit from hydrotherapy if pt can tolerate and/or wet to dry dressing changes   FEN:  IV fluids/Dys 1 ID:  Zosyn/Vancomycin 9/28 - 9/30 - discontinued per ID DVT:  Lovenox Follow up:  Wound care clinic    Plan:  Continue dressing changes and hydrotherapy.  She does not need to go back to the OR for this Rx.  Awaiting Hospice evaluation.    LOS: 2 days    JENNINGS,WILLARD 01/28/2018 [phone]

## 2018-01-28 NOTE — Progress Notes (Signed)
PHARMACY - PHYSICIAN COMMUNICATION CRITICAL VALUE ALERT - BLOOD CULTURE IDENTIFICATION (BCID)  [name] [name] is an 84 y.o. female who presented to [facility] on 01/24/2018 with a chief complaint of sepsis  Assessment:  Infected decubitus,  GNR in anaerobic bottle only of one set from 9/28 BCx (BCID did not detect anything)  Name of physician (or Provider) Contacted: Drs Luciana Axe and Flonnie Hailstone  Current antibiotics: piperacillin/tazobactam  Changes to prescribed antibiotics recommended:  Patient is on recommended antibiotics - No changes needed  Results for orders placed or performed during the hospital encounter of 01/24/18  Blood Culture ID Panel (Reflexed) (Collected: 01/24/2018  8:43 PM)  Result Value Ref Range   Enterococcus species NOT DETECTED NOT DETECTED   Listeria monocytogenes NOT DETECTED NOT DETECTED   Staphylococcus species NOT DETECTED NOT DETECTED   Staphylococcus aureus NOT DETECTED NOT DETECTED   Streptococcus species NOT DETECTED NOT DETECTED   Streptococcus agalactiae NOT DETECTED NOT DETECTED   Streptococcus pneumoniae NOT DETECTED NOT DETECTED   Streptococcus pyogenes NOT DETECTED NOT DETECTED   Acinetobacter baumannii NOT DETECTED NOT DETECTED   Enterobacteriaceae species NOT DETECTED NOT DETECTED   Enterobacter cloacae complex NOT DETECTED NOT DETECTED   Escherichia coli NOT DETECTED NOT DETECTED   Klebsiella oxytoca NOT DETECTED NOT DETECTED   Klebsiella pneumoniae NOT DETECTED NOT DETECTED   Proteus species NOT DETECTED NOT DETECTED   Serratia marcescens NOT DETECTED NOT DETECTED   Haemophilus influenzae NOT DETECTED NOT DETECTED   Neisseria meningitidis NOT DETECTED NOT DETECTED   Pseudomonas aeruginosa NOT DETECTED NOT DETECTED   Candida albicans NOT DETECTED NOT DETECTED   Candida glabrata NOT DETECTED NOT DETECTED   Candida krusei NOT DETECTED NOT DETECTED   Candida parapsilosis NOT DETECTED NOT DETECTED   Candida tropicalis NOT DETECTED NOT DETECTED    Juliette Alcide, PharmD, BCPS.   Work Cell: [phone] 01/28/2018 11:15 AM

## 2018-01-28 NOTE — Progress Notes (Signed)
  Speech Language Pathology Treatment: Dysphagia  Patient Details Name: [name] [name] MRN: [id number] DOB: Nov 06, 1922 Today's Date: 01/28/2018 Time: 1093-2355 SLP Time Calculation (min) (ACUTE ONLY): 20 min  Assessment / Plan / Recommendation Clinical Impression  Pt seen at bedside for assessment of diet tolerance. RN reports improved po intake since yesterday. No overt s/s aspiration reported. Pt accepted bites of ice cream and applesauce, and sips of water. No overt difficulty noted. No family present for education. Safe swallow precautions posted at [facility]. Recommend continuing with dys 1 (puree) diet and thin liquids. ST will sign off at this time. Please reconsult if needs arise.    HPI HPI: 84 yo female adm to [facility] with sepsis from UTI and possible necrotizing fascitis with concern for osteomyelitis.  Pt with PMH + for dementia, is essentially bed found, cataracts, glaucome, pressure injury to right buttock.  Pt leaning toward her left side - suspect to decrease pressure on ulcer.  Swallow evalution ordered. Per notes, palliative referral pending.        SLP Plan  All goals met - DC ST       Recommendations  Diet recommendations: Dysphagia 1 (puree);Thin liquid Medication Administration: Crushed with puree Supervision: Full supervision/cueing for compensatory strategies Compensations: Minimize environmental distractions;Slow rate;Small sips/bites Postural Changes and/or Swallow Maneuvers: Seated upright 90 degrees;Upright 30-60 min after meal                Oral Care Recommendations: Oral care before and after PO Follow up Recommendations: None SLP Visit Diagnosis: Dysphagia, oral phase (R13.11) Plan: All goals met       GO              Celia B. Quentin Ore [facility], CCC-SLP Speech Language Pathologist [phone]  Shonna Chock 01/28/2018, 11:21 AM

## 2018-01-28 NOTE — Progress Notes (Signed)
[facility]   Reason for visit: Follow up on osteomyelitis, myositis  Interval History: hospice has evaluated the patient;  WBC stable, afebrile; does not verbalize today   Physical Exam: Constitutional:  Vitals:   01/28/18 0607 01/28/18 1317  BP: 111/61 (!) 101/58  Pulse: 93 76  Resp: 20 16  Temp: 98.6 F (37 C) 98.4 F (36.9 C)  SpO2: 98% 94%   patient appears in nad Respiratory: Normal respiratory effort; CTA B Cardiovascular: RRR GI: soft, nt, nd MS: no edema  Review of Systems: Unable to be assessed due to mental status   Lab Results  Component Value Date   WBC 14.5 (H) 01/28/2018   HGB 10.7 (L) 01/28/2018   HCT 35.1 (L) 01/28/2018   MCV 95.9 01/28/2018   PLT 260 01/28/2018    Lab Results  Component Value Date   CREATININE 0.83 01/28/2018   BUN 20 01/28/2018   NA 138 01/28/2018   K 3.9 01/28/2018   CL 106 01/28/2018   CO2 23 01/28/2018    Lab Results  Component Value Date   ALT 21 01/28/2018   AST 29 01/28/2018   ALKPHOS 91 01/28/2018     Microbiology: Recent Results (from the past 240 hour(s))  Blood Culture (routine x 2)     Status: None (Preliminary result)   Collection Time: 01/24/18  8:43 PM  Result Value Ref Range Status   Specimen Description   Final    BLOOD RIGHT FOREARM Performed at [facility], [address], [place], [place] [address]    Special Requests   Final    BOTTLES DRAWN AEROBIC AND ANAEROBIC Blood Culture adequate volume Performed at [facility], [address] [place], [place] [address]    Culture  Setup Time   Final    GRAM NEGATIVE RODS ANAEROBIC BOTTLE ONLY Organism ID to follow CRITICAL RESULT CALLED TO, READ BACK BY AND VERIFIED WITH: Fenton Foy PharmD 10:35 01/28/18 ([email or web address]) Performed at [facility], [address], [place], [place] [address]    Culture GRAM NEGATIVE RODS  Final   Report Status PENDING  Incomplete  Blood Culture ID  Panel (Reflexed)     Status: None   Collection Time: 01/24/18  8:43 PM  Result Value Ref Range Status   Enterococcus species NOT DETECTED NOT DETECTED Final   Listeria monocytogenes NOT DETECTED NOT DETECTED Final   Staphylococcus species NOT DETECTED NOT DETECTED Final   Staphylococcus aureus NOT DETECTED NOT DETECTED Final   Streptococcus species NOT DETECTED NOT DETECTED Final   Streptococcus agalactiae NOT DETECTED NOT DETECTED Final   Streptococcus pneumoniae NOT DETECTED NOT DETECTED Final   Streptococcus pyogenes NOT DETECTED NOT DETECTED Final   Acinetobacter baumannii NOT DETECTED NOT DETECTED Final   Enterobacteriaceae species NOT DETECTED NOT DETECTED Final   Enterobacter cloacae complex NOT DETECTED NOT DETECTED Final   Escherichia coli NOT DETECTED NOT DETECTED Final   Klebsiella oxytoca NOT DETECTED NOT DETECTED Final   Klebsiella pneumoniae NOT DETECTED NOT DETECTED Final   Proteus species NOT DETECTED NOT DETECTED Final   Serratia marcescens NOT DETECTED NOT DETECTED Final   Haemophilus influenzae NOT DETECTED NOT DETECTED Final   Neisseria meningitidis NOT DETECTED NOT DETECTED Final   Pseudomonas aeruginosa NOT DETECTED NOT DETECTED Final   Candida albicans NOT DETECTED NOT DETECTED Final   Candida glabrata NOT DETECTED NOT DETECTED Final   Candida krusei NOT DETECTED NOT DETECTED Final  Candida parapsilosis NOT DETECTED NOT DETECTED Final   Candida tropicalis NOT DETECTED NOT DETECTED Final    Comment: Performed at [facility], [address] [place], [place] [address]  Blood Culture (routine x 2)     Status: None (Preliminary result)   Collection Time: 01/24/18  9:00 PM  Result Value Ref Range Status   Specimen Description   Final    BLOOD LEFT ARM Performed at [facility], [address], [place], [place] [address]    Special Requests   Final    BOTTLES DRAWN AEROBIC AND ANAEROBIC Blood Culture adequate volume Performed at [facility], [address], [place], [place] [address]    Culture   Final    NO GROWTH 4 DAYS Performed at [facility], [address], [place], [place] [address]    Report Status PENDING  Incomplete  Urine Culture     Status: Abnormal   Collection Time: 01/24/18  9:40 PM  Result Value Ref Range Status   Specimen Description   Final    URINE, CATHETERIZED Performed at [facility], [address], [place], [place] [address]    Special Requests   Final    NONE Performed at [facility], [address] [place], [place] [address]    Culture >=100,000 COLONIES/mL ESCHERICHIA COLI (A)  Final   Report Status 01/27/2018 FINAL  Final   Organism ID, Bacteria ESCHERICHIA COLI (A)  Final      Susceptibility   Escherichia coli - MIC*    AMPICILLIN 4 SENSITIVE Sensitive     CEFAZOLIN <=4 SENSITIVE Sensitive     CEFTRIAXONE <=1 SENSITIVE Sensitive     CIPROFLOXACIN <=0.25 SENSITIVE Sensitive     GENTAMICIN <=1 SENSITIVE Sensitive     IMIPENEM <=0.25 SENSITIVE Sensitive     NITROFURANTOIN <=16 SENSITIVE Sensitive     TRIMETH/SULFA <=20 SENSITIVE Sensitive     AMPICILLIN/SULBACTAM <=2 SENSITIVE Sensitive     PIP/TAZO <=4 SENSITIVE Sensitive     Extended ESBL NEGATIVE Sensitive     * >=100,000 COLONIES/mL ESCHERICHIA COLI  MRSA PCR Screening     Status: None   Collection Time: 01/25/18  3:49 AM  Result Value Ref Range Status   MRSA by PCR NEGATIVE NEGATIVE Final    Comment:        The GeneXpert MRSA Assay (FDA approved for NASAL specimens only), is one component of a comprehensive MRSA colonization surveillance program. It is not intended to diagnose MRSA infection nor to guide or monitor treatment for MRSA infections. Performed at [facility], [address] [place], [place] [address]   Aerobic/Anaerobic Culture (surgical/deep wound)     Status: Abnormal (Preliminary result)   Collection Time:  01/25/18  2:40 PM  Result Value Ref Range Status   Specimen Description   Final    WOUND Performed at [facility], [address], [place], [place] [address]    Special Requests R ISCHIAL DECUBITUS  Final   Gram Stain   Final    NO WBC SEEN ABUNDANT GRAM NEGATIVE RODS ABUNDANT GRAM POSITIVE COCCI IN PAIRS IN CLUSTERS FEW GRAM POSITIVE RODS Performed at [facility], [address], [place], [place] [address]    Culture (A)  Final    MULTIPLE ORGANISMS PRESENT, NONE PREDOMINANT NO ANAEROBES ISOLATED; CULTURE IN PROGRESS FOR 5 DAYS    Report Status PENDING  Incomplete    Impression/Plan:  1. Osteomyelitis and myositis - on pip-tazo and has been debrided.  Will continue this for now pending discussion of hospice. Likely to stop with hospice.  Will not add to comfort.   2.  Hospice - has been accepted to [facility], awaiting confirmation.   3.  UTI - unclear if infected.  Has completed sufficient treatment duration

## 2018-01-28 NOTE — Progress Notes (Signed)
   01/28/18  Hydrotherapy Evaluation [id number]   Subjective Assessment  Subjective nonverbal  Patient and Family Stated Goals unable  Date of Onset  (present on admission)  Prior Treatments dressing changes  Evaluation and Treatment  Evaluation and Treatment Procedures Explained to Patient/Family Yes  Evaluation and Treatment Procedures Patient unable to consent due to mental status  Wound / Incision (Open or Dehisced) 01/28/18 Other (Comment) Sacrum Medial sacral wound with slough and purulent drainage  Date First Assessed/Time First Assessed: 01/28/18 1500   Wound Type: Other (Comment)  Location: Sacrum  Location Orientation: Medial  Wound Description (Comments): sacral wound with slough and purulent drainage  Present on Admission: Yes  Dressing Type ABD;Moist to dry  Dressing Changed New  Dressing Status Clean  Dressing Change Frequency Twice a day  Site / Wound Assessment Painful;Dusky;Friable  % Wound base Red or Granulating 10%  % Wound base Yellow/Fibrinous Exudate 90%  Peri-wound Assessment Excoriated;Maceration;Purple  Wound Length (cm) 8 cm  Wound Width (cm) 6 cm  Wound Depth (cm) 3 cm  Wound Volume (cm^3) 144 cm^3  Wound Surface Area (cm^2) 48 cm^2  Tunneling (cm) 4  Undermining (cm) 2  Margins Unattached edges (unapproximated)  Drainage Amount Copious  Drainage Description Purulent;Odor (from caudal area with pressed.)  Non-staged Wound Description Full thickness  Treatment Debridement (Selective);Hydrotherapy (Pulse lavage);Packing (Saline gauze)  Hydrotherapy  Pulsed lavage therapy - wound location sacrum  Pulsed Lavage with Suction (psi) 4 psi  Pulsed Lavage with Suction - Normal Saline Used 1000 mL  Selective Debridement  Selective Debridement - Location sacrum  Selective Debridement - Tools Used Forceps;Scissors  Selective Debridement - Tissue Removed slough  Wound Therapy - Assess/Plan/Recommendations  Wound Therapy - Clinical Statement Elderly female  admitted with pressure injury to sacrum. Wound draining purulent  yellow drainage, minimal granulation tissue. the patient is requiring total assistance for care and positioning. Patient will benefit from hydrotherapy  to improve wound characteristics and promote wound healing.  Wound Therapy - Functional Problem List total care  Factors Delaying/Impairing Wound Healing Immobility;Multiple medical problems  Hydrotherapy Plan Dressing change;Pulsatile lavage with suction  Wound Therapy - Frequency 6X / week  Wound Therapy - Current Recommendations PT  Wound Therapy - Follow Up Recommendations Skilled nursing facility  Wound Plan PLS and dressing changes  Wound Therapy Goals - Improve the function of patient's integumentary system by progressing the wound(s) through the phases of wound healing by:  Decrease Necrotic Tissue to 50  Decrease Necrotic Tissue - Progress Goal set today  Increase Granulation Tissue to 50  Increase Granulation Tissue - Progress Goal set today  Improve Drainage Characteristics Min  Improve Drainage Characteristics - Progress Goal set today  Goals/treatment plan/discharge plan were made with and agreed upon by patient/family No, Patient unable to participate in goals/treatment/discharge plan and family unavailable  Wound Therapy - Potential for Goals Poor  Blanchard Kelch PT Acute Rehabilitation Services Pager [phone] Office [phone]

## 2018-01-28 NOTE — Progress Notes (Signed)
PHARMACY NOTE -  Zosyn  Pharmacy has been assisting with dosing of Zosyn for osteomyelitis/myositis related to infected pressure ulcer  Dosage remains stable at 3.375 g IV q8 hr by extended infusion and need for further dosage adjustment appears unlikely at present given stable renal function  Pharmacy will sign off, following peripherally for culture results or dose adjustments.  Bernadene Person, PharmD, BCPS [phone] 01/28/2018, 12:03 PM

## 2018-01-28 NOTE — Progress Notes (Signed)
PMT progress note  Patient resting in bed Chart reviewed, ID notes reviewed.  Discussed with RN case manager Ms Mahabir.  Appreciate CSW consult.   Appreciate SLP eval and recommendations.   BP 111/61 (BP Location: Left Arm)   Pulse 93   Temp 98.6 F (37 C) (Oral)   Resp 20   Ht 5\' 2"  (1.575 m)   Wt 42 kg   SpO2 98%   BMI 16.94 kg/m  Labs and imaging noted  PPS 30%  Frail weak lady No distress Thin has muscle wasting No dyspnea noted  84 yo lady with osteomyelitis, myositis, UTI, frailty, diminished quality of life, non mobility.   Await hospice consultation and recommendations Continue current mode of care for now.  25 minutes spent 85 MD [facility] health palliative medicine team [phone]

## 2018-01-28 NOTE — Care Management Note (Signed)
Case Management Note  Patient Details  Name: ANJANI FEUERBORN MRN: [id number] Date of Birth: 03/29/1923  Subjective/Objective:Per palliative-patient agree to residential hospice-CSW following.                    Action/Plan:dc residential hospice.   Expected Discharge Date:  01/29/18               Expected Discharge Plan:  Hospice Medical Facility  In-House Referral:  Clinical Social Work  Discharge planning Services  CM Consult  Post Acute Care Choice:    Choice offered to:     DME Arranged:    DME Agency:     HH Arranged:    HH Agency:     Status of Service:  Completed, signed off  If discussed at [employer], dates discussed:    Additional Comments:  Lanier Clam, RN 01/28/2018, 11:28 AM

## 2018-01-29 LAB — CBC
HCT: 34.9 % — ABNORMAL LOW (ref 36.0–46.0)
HEMOGLOBIN: 11 g/dL — AB (ref 12.0–15.0)
MCH: 29.9 pg (ref 26.0–34.0)
MCHC: 31.5 g/dL (ref 30.0–36.0)
MCV: 94.8 fL (ref 78.0–100.0)
Platelets: 271 10*3/uL (ref 150–400)
RBC: 3.68 MIL/uL — AB (ref 3.87–5.11)
RDW: 16.6 % — ABNORMAL HIGH (ref 11.5–15.5)
WBC: 15.6 10*3/uL — ABNORMAL HIGH (ref 4.0–10.5)

## 2018-01-29 LAB — COMPREHENSIVE METABOLIC PANEL
ALK PHOS: 82 U/L (ref 38–126)
ALT: 18 U/L (ref 0–44)
ANION GAP: 11 (ref 5–15)
AST: 24 U/L (ref 15–41)
Albumin: 1.7 g/dL — ABNORMAL LOW (ref 3.5–5.0)
BUN: 13 mg/dL (ref 8–23)
CO2: 27 mmol/L (ref 22–32)
CREATININE: 0.84 mg/dL (ref 0.44–1.00)
Calcium: 7.6 mg/dL — ABNORMAL LOW (ref 8.9–10.3)
Chloride: 101 mmol/L (ref 98–111)
GFR, EST NON AFRICAN AMERICAN: 58 mL/min — AB (ref >60)
Glucose, Bld: 133 mg/dL — ABNORMAL HIGH (ref 70–99)
Potassium: 3.2 mmol/L — ABNORMAL LOW (ref 3.5–5.1)
Sodium: 139 mmol/L (ref 135–145)
TOTAL PROTEIN: 4.9 g/dL — AB (ref 6.5–8.1)
Total Bilirubin: 0.5 mg/dL (ref 0.3–1.2)

## 2018-01-29 LAB — CULTURE, BLOOD (ROUTINE X 2)
Culture: NO GROWTH
Special Requests: ADEQUATE

## 2018-01-29 NOTE — Plan of Care (Signed)
  Problem: Elimination: Goal: Will not experience complications related to urinary retention Outcome: Progressing   Problem: Nutrition: Goal: Adequate nutrition will be maintained Outcome: Progressing   Problem: Safety: Goal: Ability to remain free from injury will improve Outcome: Progressing

## 2018-01-29 NOTE — Care Management Important Message (Signed)
Important Message  Patient Details  Name: ARIYEL JEANGILLES MRN: [id number] Date of Birth: 05-Jan-1923   Medicare Important Message Given:  Yes    Caren Macadam 01/29/2018, 11:48 AMImportant Message  Patient Details  Name: MODELLE VOLLMER MRN: [id number] Date of Birth: 01-02-1923   Medicare Important Message Given:  Yes    Caren Macadam 01/29/2018, 11:48 AM

## 2018-01-29 NOTE — Discharge Summary (Signed)
Physician Discharge Summary  [name] [name] PXT:[id number] DOB: [birth year] DOA: 01/24/2018  PCP: Alysia Penna, MD  Admit date: 01/24/2018 Discharge date: 01/29/2018  Admitted From: ALF Disposition:  ALF with hospice   Home Health:no Equipment/Devices:none    Discharge Condition:hospice CODE STATUS:DNR Diet recommendation: Heart Healthy  Brief/Interim Summary: The patient is a elderly thin frail and cachectic appearing Caucasian female with a past medical history significant for rheumatoid arthritis, dementia, cataracts, glaucoma, who is essentially bedbound, who presents with increasingly worse confusion and poor p.o. intake for 3 days prior to admission. Patient had a fall 6 weeks ago when she had a periprosthetic hip fracture and was managed conservatively and subsequently developed sacral decubitus ulcers on her buttock.   She was admitted for a suspected UTI however the right buttock ulcer looked infected and a CT of the abdomen pelvis was done and it showed concern for necrotizing fasciitis with a right hamstring intramuscular abscess. General surgery was consulted and did not feel that she had true necrotizing fasciitis however nonetheless there do not take the patient to the OR for debridement. She also had a focal cortical dehiscence of the right proximal femur most compatible with osteomyelitis. Infectious disease was consulted for further evaluation recommendations and have de-escalate antibiotics to just IV Zosyn.    Palliative care has been called for goals of care and SLP will be done for swallow evaluation.  Patient was placed on a stage I diet with thin liquids and palliative discussed with the healthcare power of attorney and they are now choosing hospice and comfort care as opposed to going back to SNF with palliative. Pt will be discharge with home hospice. No more antibiotics needed. Only the medications for comfort care will be remained for hospice use.   Discharge  Diagnoses:  Principal Problem:   Sepsis (HCC) Active Problems:   Leukocytosis   Dementia (HCC)   E. coli UTI   Pressure injury of skin of right buttock   Encounter for palliative care   Goals of care, counseling/discussion   Osteomyelitis (HCC)   Myositis   Cachexia (HCC)   Severe protein-calorie malnutrition (HCC)   Hypophosphatemia   Hypoalbuminemia   Failure to thrive in adult   Acute metabolic encephalopathy    Discharge Instructions  Discharge Instructions    Diet - low sodium heart healthy   Complete by:  As directed    Discharge instructions   Complete by:  As directed    Palliative care and hospice RN to follow up and give further instruction     Allergies as of 01/29/2018   No Known Allergies     Medication List    STOP taking these medications   aspirin 81 MG chewable tablet   diclofenac sodium 1 % Gel Commonly known as:  VOLTAREN   enoxaparin 30 MG/0.3ML injection Commonly known as:  LOVENOX   folic acid 1 MG tablet Commonly known as:  FOLVITE   furosemide 20 MG tablet Commonly known as:  LASIX   lidocaine 4 % cream Commonly known as:  LMX   Melatonin 5 MG Tabs   methotrexate 2.5 MG tablet Commonly known as:  RHEUMATREX   mirtazapine 15 MG tablet Commonly known as:  REMERON   neomycin-bacitracin-polymyxin [phone] ointment   nitrofurantoin (macrocrystal-monohydrate) 100 MG capsule Commonly known as:  MACROBID   NON FORMULARY   olopatadine 0.1 % ophthalmic solution Commonly known as:  PATANOL   potassium chloride 10 MEQ tablet Commonly known as:  K-DUR,KLOR-CON  senna 8.6 MG Tabs tablet Commonly known as:  SENOKOT   traZODone 50 MG tablet Commonly known as:  DESYREL   trolamine salicylate 10 % cream Commonly known as:  ASPERCREME   vitamin B-12 1000 MCG tablet Commonly known as:  CYANOCOBALAMIN   Vitamin D3 2000 units capsule     TAKE these medications   acetaminophen 650 MG CR tablet Commonly known as:   TYLENOL Take 650 mg by mouth 3 (three) times daily.   albuterol (2.5 MG/3ML) 0.083% nebulizer solution Commonly known as:  PROVENTIL Take 2.5 mg by nebulization every 6 (six) hours as needed for wheezing or shortness of breath.   cycloSPORINE 0.05 % ophthalmic emulsion Commonly known as:  RESTASIS Place 1 drop into both eyes 2 (two) times daily.   LORazepam 0.5 MG tablet Commonly known as:  ATIVAN Take 0.25 mg by mouth 2 (two) times daily as needed for anxiety.   OLANZapine 2.5 MG tablet Commonly known as:  ZYPREXA Take 1.25 mg by mouth daily.   ondansetron 4 MG tablet Commonly known as:  ZOFRAN Take 4 mg by mouth every 12 (twelve) hours as needed for nausea or vomiting.   polyethylene glycol packet Commonly known as:  MIRALAX / GLYCOLAX Take 17 g by mouth daily as needed for mild constipation.   traMADol 50 MG tablet Commonly known as:  ULTRAM Take 50 mg by mouth 2 (two) times daily.       No Known Allergies  Consultations:  Palliative care; ID; general surgery   Procedures/Studies: Dg Tibia/fibula Left  Result Date: 01/03/2018 CLINICAL DATA:  Acute LEFT LOWER leg pain following fall 1 month ago. Initial encounter. EXAM: LEFT TIBIA AND FIBULA - 2 VIEW COMPARISON:  None. FINDINGS: No acute fracture, subluxation or dislocation. No focal bony lesions are present. Soft tissue swelling is present. IMPRESSION: Soft tissue swelling without bony abnormality. Electronically Signed   By: Harmon Pier M.D.   On: 01/03/2018 16:44   Dg Tibia/fibula Right  Result Date: 01/03/2018 CLINICAL DATA:  Acute RIGHT LOWER leg pain following fall 1 month ago. Initial encounter. EXAM: RIGHT TIBIA AND FIBULA - 2 VIEW COMPARISON:  08/20/2016 FINDINGS: No acute fracture, subluxation or dislocation. No focal bony abnormalities are identified. Mild soft tissue swelling is present. IMPRESSION: Mild soft tissue swelling without bony abnormality. Electronically Signed   By: Harmon Pier M.D.   On:  01/03/2018 16:46   Ct Pelvis W Contrast  Result Date: 01/25/2018 CLINICAL DATA:  Infection, sepsis, urinary tract infection, pressure ulcer. EXAM: CT PELVIS WITH CONTRAST TECHNIQUE: Multidetector CT imaging of the pelvis was performed using the standard protocol following the bolus administration of intravenous contrast. CONTRAST:  29mL OMNIPAQUE IOHEXOL 300 MG/ML  SOLN COMPARISON:  Pelvic radiograph August 20, 2016 FINDINGS: Urinary Tract:  Decompressed by Foley catheter. Bowel: RIGHT lower quadrant small bowel wall thickening and hyperenhancement. Included small and large bowel are normal in course and caliber. Vascular/Lymphatic: Severe calcific atherosclerosis. Mildly ectatic infrarenal aorta. No lymphadenopathy. Reproductive: Uterus not discretely identified, potentially obscured by hardware artifact. Other: 7.5 x 7.7 x 6.3 cm focal fluid or cyst without rim enhancement in presacral fat. Musculoskeletal: RIGHT gluteal 2 proximal femoral subcutaneous fat stranding with subcutaneous gas contiguous with ischial tuberosity which is intact. Effaced RIGHT hamstring fat planes and suspected fluid collection. Focal cortical dehiscence medial proximal femur (series 4, image 126) status post LEFT hip hemiarthroplasty and RIGHT femoral neck pinning with intramedullary rod. Osteopenia. IMPRESSION: 1. RIGHT gluteal necrotizing fasciitis with subcutaneous gas  and myositis. Possible RIGHT hamstring intramuscular abscess though limited by hardware artifact. 2. Focal cortical dehiscence proximal RIGHT femur most compatible with osteomyelitis. 3. Partially imaged enteritis without bowel obstruction. 4. Nonspecific 7.5 x 7.7 x 6.3 cm cyst or fluid collection in pelvis. Electronically Signed   By: Awilda Metro M.D.   On: 01/25/2018 02:57   Dg Chest Portable 1 View  Result Date: 01/24/2018 CLINICAL DATA:  Altered level of consciousness EXAM: PORTABLE CHEST 1 VIEW COMPARISON:  07/04/2017 FINDINGS: No focal opacity or  pleural effusion. Cardiomediastinal silhouette within normal limits. Aortic atherosclerosis. No pneumothorax. Subacute to old left fourth rib fracture. IMPRESSION: No active disease. Electronically Signed   By: Jasmine Pang M.D.   On: 01/24/2018 20:27   Dg Femur Min 2 Views Left  Result Date: 01/03/2018 CLINICAL DATA:  LEFT hip pain following fall 1 month ago. Initial encounter. EXAM: LEFT FEMUR 2 VIEWS COMPARISON:  08/20/2016 radiographs FINDINGS: Cortical offset of the greater trochanter appears new since 08/20/2016 and suspicious for minimally displaced fracture. LEFT hip arthroplasty changes identified without complicating features. No other abnormalities noted. IMPRESSION: 1. Apparent cortical offset of the greater trochanter which appears new since 08/20/2016 and suspicious for minimally displaced fracture. 2. LEFT hip arthroplasty without dislocation. Electronically Signed   By: Harmon Pier M.D.   On: 01/03/2018 16:39   Dg Femur Min 2 Views Right  Result Date: 01/03/2018 CLINICAL DATA:  Acute RIGHT leg pain following fall 1 month ago. Initial encounter. EXAM: RIGHT FEMUR 2 VIEWS COMPARISON:  None. FINDINGS: Internal rod/screw fixation traversing a remote intertrochanteric femur fracture again noted without complicating hardware features. Lesser trochanter fragment is unchanged. No acute fracture or dislocation noted. IMPRESSION: No evidence of acute bony abnormality. Internal fixation of remote intertrochanteric femur fracture again noted. Electronically Signed   By: Harmon Pier M.D.   On: 01/03/2018 16:40       Subjective:   Discharge Exam: Vitals:   01/28/18 2055 01/29/18 0521  BP: 108/63 (!) 109/55  Pulse: 85 89  Resp: 18 18  Temp: 98 F (36.7 C) 98.2 F (36.8 C)  SpO2: 98% 97%   Vitals:   01/28/18 1013 01/28/18 1317 01/28/18 2055 01/29/18 0521  BP:  (!) 101/58 108/63 (!) 109/55  Pulse:  76 85 89  Resp:  16 18 18   Temp:  98.4 F (36.9 C) 98 F (36.7 C) 98.2 F (36.8 C)   TempSrc:  Oral Axillary Axillary  SpO2:  94% 98% 97%  Weight: 42 kg   39.9 kg  Height:        Physical Exam:  Constitutional: Thin, elderly, frail, cachectic appearing Caucasian female who is currently no acute distress appears calm but does appear uncomfortable Eyes: Lids and conjunctive are normal.  Sclera anicteric ENMT: External ears and nose appear normal.  Somewhat hard of hearing Neck: Appears supple no JVD Respiratory: Initial auscultation bilaterally.  No appreciable wheezing, rales, rhonchi.  Patient not tachypneic wheezing excess muscle breathe Cardiovascular: Regular rate and rhythm.  Has a 2 out of 6 systolic murmur.  Trace lower extremity edema Abdomen: Soft, nontender, nondistended.  Bowel sounds present in 4 quadrants GU: Deferred Musculoskeletal: No clubbing or cyanosis.  No joint deformities noted Skin: Has a right ischial decubitus ulcer is covered Neurologic: Independently moves all extremities.  Cranial nerves II through XII grossly intact Psychiatric: Impaired judgment insight.  Patient is awake and alert and oriented x1    The results of significant diagnostics from this hospitalization (including imaging,  microbiology, ancillary and laboratory) are listed below for reference.     Microbiology: Recent Results (from the past 240 hour(s))  Blood Culture (routine x 2)     Status: None (Preliminary result)   Collection Time: 01/24/18  8:43 PM  Result Value Ref Range Status   Specimen Description   Final    BLOOD RIGHT FOREARM Performed at [facility], [address], [place], [place] [address]    Special Requests   Final    BOTTLES DRAWN AEROBIC AND ANAEROBIC Blood Culture adequate volume Performed at [facility], [address], [place], [place] [address]    Culture  Setup Time   Final    GRAM NEGATIVE RODS ANAEROBIC BOTTLE ONLY CRITICAL RESULT CALLED TO, READ BACK BY AND VERIFIED WITH: Fenton Foy PharmD 10:35  01/28/18 ([email or web address]) Performed at [facility], [address], [place], [place] [address]    Culture GRAM NEGATIVE RODS  Final   Report Status PENDING  Incomplete  Blood Culture ID Panel (Reflexed)     Status: None   Collection Time: 01/24/18  8:43 PM  Result Value Ref Range Status   Enterococcus species NOT DETECTED NOT DETECTED Final   Listeria monocytogenes NOT DETECTED NOT DETECTED Final   Staphylococcus species NOT DETECTED NOT DETECTED Final   Staphylococcus aureus (BCID) NOT DETECTED NOT DETECTED Final   Streptococcus species NOT DETECTED NOT DETECTED Final   Streptococcus agalactiae NOT DETECTED NOT DETECTED Final   Streptococcus pneumoniae NOT DETECTED NOT DETECTED Final   Streptococcus pyogenes NOT DETECTED NOT DETECTED Final   Acinetobacter baumannii NOT DETECTED NOT DETECTED Final   Enterobacteriaceae species NOT DETECTED NOT DETECTED Final   Enterobacter cloacae complex NOT DETECTED NOT DETECTED Final   Escherichia coli NOT DETECTED NOT DETECTED Final   Klebsiella oxytoca NOT DETECTED NOT DETECTED Final   Klebsiella pneumoniae NOT DETECTED NOT DETECTED Final   Proteus species NOT DETECTED NOT DETECTED Final   Serratia marcescens NOT DETECTED NOT DETECTED Final   Haemophilus influenzae NOT DETECTED NOT DETECTED Final   Neisseria meningitidis NOT DETECTED NOT DETECTED Final   Pseudomonas aeruginosa NOT DETECTED NOT DETECTED Final   Candida albicans NOT DETECTED NOT DETECTED Final   Candida glabrata NOT DETECTED NOT DETECTED Final   Candida krusei NOT DETECTED NOT DETECTED Final   Candida parapsilosis NOT DETECTED NOT DETECTED Final   Candida tropicalis NOT DETECTED NOT DETECTED Final    Comment: Performed at [facility], [address], [place], [place] [address]  Blood Culture (routine x 2)     Status: None   Collection Time: 01/24/18  9:00 PM  Result Value Ref Range Status   Specimen Description   Final    BLOOD LEFT ARM Performed at [facility], [address], [place], [place] [address]    Special Requests   Final    BOTTLES DRAWN AEROBIC AND ANAEROBIC Blood Culture adequate volume Performed at [facility], [address] [place], [place] [address]    Culture   Final    NO GROWTH 5 DAYS Performed at [facility], [address] [place], [place] [address]    Report Status 01/29/2018 FINAL  Final  Urine Culture     Status: Abnormal   Collection Time: 01/24/18  9:40 PM  Result Value Ref Range Status   Specimen Description   Final    URINE, CATHETERIZED Performed at [facility], [address] [place], [place] [address]    Special Requests   Final    NONE Performed at [facility], [address] [place], [place] [address]    Culture >=100,000 COLONIES/mL ESCHERICHIA COLI (A)  Final   Report Status 01/27/2018 FINAL  Final   Organism ID, Bacteria ESCHERICHIA COLI (A)  Final      Susceptibility   Escherichia coli - MIC*    AMPICILLIN 4 SENSITIVE Sensitive     CEFAZOLIN <=4 SENSITIVE Sensitive     CEFTRIAXONE <=1 SENSITIVE Sensitive     CIPROFLOXACIN <=0.25 SENSITIVE Sensitive     GENTAMICIN <=1 SENSITIVE Sensitive     IMIPENEM <=0.25 SENSITIVE Sensitive     NITROFURANTOIN <=16 SENSITIVE Sensitive     TRIMETH/SULFA <=20 SENSITIVE Sensitive     AMPICILLIN/SULBACTAM <=2 SENSITIVE Sensitive     PIP/TAZO <=4 SENSITIVE Sensitive     Extended ESBL NEGATIVE Sensitive     * >=100,000 COLONIES/mL ESCHERICHIA COLI  MRSA PCR Screening     Status: None   Collection Time: 01/25/18  3:49 AM  Result Value Ref Range Status   MRSA by PCR NEGATIVE NEGATIVE Final    Comment:        The GeneXpert MRSA Assay (FDA approved for NASAL specimens only), is one component of a comprehensive MRSA colonization surveillance program. It is not intended to diagnose MRSA infection nor to guide or monitor treatment for MRSA infections. Performed at [facility], [address], [place], [place] [address]   Aerobic/Anaerobic Culture (surgical/deep wound)     Status: Abnormal (Preliminary result)   Collection Time: 01/25/18  2:40 PM  Result Value Ref Range Status   Specimen Description   Final    WOUND Performed at [facility], [address], [place], [place] [address]    Special Requests R ISCHIAL DECUBITUS  Final   Gram Stain   Final    NO WBC SEEN ABUNDANT GRAM NEGATIVE RODS ABUNDANT GRAM POSITIVE COCCI IN PAIRS IN CLUSTERS FEW GRAM POSITIVE RODS Performed at [facility], [address] [place], [place] [address]    Culture (A)  Final    MULTIPLE ORGANISMS PRESENT, NONE PREDOMINANT NO ANAEROBES ISOLATED; CULTURE IN PROGRESS FOR 5 DAYS    Report Status PENDING  Incomplete     Labs: BNP (last 3 results) No results for input(s): BNP in the last 8760 hours. Basic Metabolic Panel: Recent Labs  Lab 01/25/18 0409 01/26/18 0522 01/27/18 0551 01/28/18 0457 01/29/18 0951  NA 149* 154* 148* 138 139  K 4.2 3.9 3.3* 3.9 3.2*  CL 116* 121* 114* 106 101  CO2 27 24 26 23 27   GLUCOSE 118* 88 123* 100* 133*  BUN 54* 39* 31* 20 13  CREATININE 1.11* 0.99 0.96 0.83 0.84  CALCIUM 8.3* 7.9* 8.0* 7.6* 7.6*  MG  --   --  2.0 1.7  --   PHOS  --   --  1.7* 1.8*  --    Liver Function Tests: Recent Labs  Lab 01/24/18 2043 01/25/18 0409 01/27/18 0551 01/28/18 0457 01/29/18 0951  AST 22 19 37 29 24  ALT 17 15 20 21 18   ALKPHOS 106 97 94 91 82  BILITOT 0.9 0.6 0.7 0.9 0.5  PROT 6.5 5.6* 5.3* 4.9* 4.9*  ALBUMIN 2.3* 2.0* 2.1* 1.8* 1.7*   No results for input(s): LIPASE, AMYLASE in the last 168 hours. No results for input(s): AMMONIA in the last 168 hours. CBC: Recent  Labs  Lab 01/24/18 2043 01/25/18 0409 01/26/18 0522 01/27/18 0551 01/28/18 0457 01/29/18 0951  WBC 20.4* 30.1* 21.1* 14.8* 14.5* 15.6*  NEUTROABS 19.2* 28.0*  --  13.0* 12.0*  --   HGB 13.2 11.7* 11.1* 11.5* 10.7* 11.0*   HCT 41.4 38.0 37.4 38.4 35.1* 34.9*  MCV 95.2 96.9 99.7 98.5 95.9 94.8  PLT 479* 405* 351 293 260 271   Cardiac Enzymes: No results for input(s): CKTOTAL, CKMB, CKMBINDEX, TROPONINI in the last 168 hours. BNP: Invalid input(s): POCBNP CBG: No results for input(s): GLUCAP in the last 168 hours. D-Dimer No results for input(s): DDIMER in the last 72 hours. Hgb A1c No results for input(s): HGBA1C in the last 72 hours. Lipid Profile No results for input(s): CHOL, HDL, LDLCALC, TRIG, CHOLHDL, LDLDIRECT in the last 72 hours. Thyroid function studies No results for input(s): TSH, T4TOTAL, T3FREE, THYROIDAB in the last 72 hours.  Invalid input(s): FREET3 Anemia work up No results for input(s): VITAMINB12, FOLATE, FERRITIN, TIBC, IRON, RETICCTPCT in the last 72 hours. Urinalysis    Component Value Date/Time   COLORURINE AMBER (A) 01/24/2018 2043   APPEARANCEUR CLOUDY (A) 01/24/2018 2043   LABSPEC 1.012 01/24/2018 2043   PHURINE 7.0 01/24/2018 2043   GLUCOSEU NEGATIVE 01/24/2018 2043   HGBUR LARGE (A) 01/24/2018 2043   BILIRUBINUR NEGATIVE 01/24/2018 2043   KETONESUR NEGATIVE 01/24/2018 2043   PROTEINUR 30 (A) 01/24/2018 2043   NITRITE POSITIVE (A) 01/24/2018 2043   LEUKOCYTESUR LARGE (A) 01/24/2018 2043   Sepsis Labs Invalid input(s): PROCALCITONIN,  WBC,  LACTICIDVEN Microbiology Recent Results (from the past 240 hour(s))  Blood Culture (routine x 2)     Status: None (Preliminary result)   Collection Time: 01/24/18  8:43 PM  Result Value Ref Range Status   Specimen Description   Final    BLOOD RIGHT FOREARM Performed at [facility], [address], [place], [place] [address]    Special Requests   Final    BOTTLES DRAWN AEROBIC AND ANAEROBIC Blood Culture adequate volume Performed at [facility], [address], [place], [place] [address]    Culture  Setup Time   Final    GRAM NEGATIVE RODS ANAEROBIC BOTTLE ONLY CRITICAL RESULT  CALLED TO, READ BACK BY AND VERIFIED WITH: Fenton Foy PharmD 10:35 01/28/18 ([email or web address]) Performed at [facility], [address], [place], [place] [address]    Culture GRAM NEGATIVE RODS  Final   Report Status PENDING  Incomplete  Blood Culture ID Panel (Reflexed)     Status: None   Collection Time: 01/24/18  8:43 PM  Result Value Ref Range Status   Enterococcus species NOT DETECTED NOT DETECTED Final   Listeria monocytogenes NOT DETECTED NOT DETECTED Final   Staphylococcus species NOT DETECTED NOT DETECTED Final   Staphylococcus aureus (BCID) NOT DETECTED NOT DETECTED Final   Streptococcus species NOT DETECTED NOT DETECTED Final   Streptococcus agalactiae NOT DETECTED NOT DETECTED Final   Streptococcus pneumoniae NOT DETECTED NOT DETECTED Final   Streptococcus pyogenes NOT DETECTED NOT DETECTED Final   Acinetobacter baumannii NOT DETECTED NOT DETECTED Final   Enterobacteriaceae species NOT DETECTED NOT DETECTED Final   Enterobacter cloacae complex NOT DETECTED NOT DETECTED Final   Escherichia coli NOT DETECTED NOT DETECTED Final   Klebsiella oxytoca NOT DETECTED NOT DETECTED Final   Klebsiella pneumoniae NOT DETECTED NOT DETECTED Final   Proteus species NOT DETECTED NOT DETECTED Final   Serratia marcescens NOT DETECTED NOT DETECTED Final  Haemophilus influenzae NOT DETECTED NOT DETECTED Final   Neisseria meningitidis NOT DETECTED NOT DETECTED Final   Pseudomonas aeruginosa NOT DETECTED NOT DETECTED Final   Candida albicans NOT DETECTED NOT DETECTED Final   Candida glabrata NOT DETECTED NOT DETECTED Final   Candida krusei NOT DETECTED NOT DETECTED Final   Candida parapsilosis NOT DETECTED NOT DETECTED Final   Candida tropicalis NOT DETECTED NOT DETECTED Final    Comment: Performed at [facility], [address] [place], [place] [address]  Blood Culture (routine x 2)     Status: None   Collection Time: 01/24/18  9:00 PM  Result Value Ref Range Status   Specimen  Description   Final    BLOOD LEFT ARM Performed at [facility], [address], [place], [place] [address]    Special Requests   Final    BOTTLES DRAWN AEROBIC AND ANAEROBIC Blood Culture adequate volume Performed at [facility], [address], [place], [place] [address]    Culture   Final    NO GROWTH 5 DAYS Performed at [facility], [address], [place], [place] [address]    Report Status 01/29/2018 FINAL  Final  Urine Culture     Status: Abnormal   Collection Time: 01/24/18  9:40 PM  Result Value Ref Range Status   Specimen Description   Final    URINE, CATHETERIZED Performed at [facility], [address], [place], [place] [address]    Special Requests   Final    NONE Performed at [facility], [address], [place], [place] [address]    Culture >=100,000 COLONIES/mL ESCHERICHIA COLI (A)  Final   Report Status 01/27/2018 FINAL  Final   Organism ID, Bacteria ESCHERICHIA COLI (A)  Final      Susceptibility   Escherichia coli - MIC*    AMPICILLIN 4 SENSITIVE Sensitive     CEFAZOLIN <=4 SENSITIVE Sensitive     CEFTRIAXONE <=1 SENSITIVE Sensitive     CIPROFLOXACIN <=0.25 SENSITIVE Sensitive     GENTAMICIN <=1 SENSITIVE Sensitive     IMIPENEM <=0.25 SENSITIVE Sensitive     NITROFURANTOIN <=16 SENSITIVE Sensitive     TRIMETH/SULFA <=20 SENSITIVE Sensitive     AMPICILLIN/SULBACTAM <=2 SENSITIVE Sensitive     PIP/TAZO <=4 SENSITIVE Sensitive     Extended ESBL NEGATIVE Sensitive     * >=100,000 COLONIES/mL ESCHERICHIA COLI  MRSA PCR Screening     Status: None   Collection Time: 01/25/18  3:49 AM  Result Value Ref Range Status   MRSA by PCR NEGATIVE NEGATIVE Final    Comment:        The GeneXpert MRSA Assay (FDA approved for NASAL specimens only), is one component of a comprehensive MRSA colonization surveillance program. It is not intended to diagnose MRSA infection nor to guide  or monitor treatment for MRSA infections. Performed at [facility], [address], [place], [place] [address]   Aerobic/Anaerobic Culture (surgical/deep wound)     Status: Abnormal (Preliminary result)   Collection Time: 01/25/18  2:40 PM  Result Value Ref Range Status   Specimen Description   Final    WOUND Performed at [facility], [address], [place], [place] [address]    Special Requests R ISCHIAL DECUBITUS  Final   Gram Stain   Final    NO WBC SEEN ABUNDANT GRAM NEGATIVE RODS ABUNDANT GRAM POSITIVE COCCI IN PAIRS IN CLUSTERS  FEW GRAM POSITIVE RODS Performed at [facility], [address] [place], [place] [address]    Culture (A)  Final    MULTIPLE ORGANISMS PRESENT, NONE PREDOMINANT NO ANAEROBES ISOLATED; CULTURE IN PROGRESS FOR 5 DAYS    Report Status PENDING  Incomplete     Time coordinating discharge: 38 minutes  SIGNED:   Gala Murdoch, MD  [facility] 01/29/2018, 12:48 PM Pager   If 7PM-7AM, please contact night-coverage [email or web address] Password [email or web address]

## 2018-01-29 NOTE — NC FL2 (Addendum)
[place] MEDICAID FL2 LEVEL OF CARE SCREENING TOOL     IDENTIFICATION  Patient Name: [name] [name] Birthdate: 08-12-1922 Sex: female Admission Date (Current Location): 01/24/2018  [facility] and [place] Number:  Producer, television/film/video and Address:  [facility],  [address] [place], [place] [address]      Provider Number: [id number]  Attending Physician Name and Address:  Gala Murdoch, MD  Relative Name and Phone Number:       Current Level of Care: Hospital Recommended Level of Care: Memory Care Unit  Prior Approval Number:    Date Approved/Denied:   PASRR Number:    Discharge Plan: Memory Care Unit     Current Diagnoses: Patient Active Problem List   Diagnosis Date Noted  . Osteomyelitis (HCC) 01/28/2018  . Myositis 01/28/2018  . Cachexia (HCC) 01/28/2018  . Severe protein-calorie malnutrition (HCC) 01/28/2018  . Hypophosphatemia 01/28/2018  . Hypoalbuminemia 01/28/2018  . Failure to thrive in adult 01/28/2018  . Acute metabolic encephalopathy 01/28/2018  . Encounter for palliative care   . Goals of care, counseling/discussion   . E. coli UTI 01/25/2018  . Pressure injury of skin of right buttock 01/25/2018  . Sepsis (HCC) 01/24/2018  . Closed comminuted intertrochanteric fracture of femur with delayed healing, subsequent encounter 08/21/2016  . Fall   . Closed right hip fracture (HCC) 08/20/2016  . Leukocytosis 08/20/2016  . Hyperglycemia 08/20/2016  . Dementia (HCC) 08/20/2016  . Osteoporosis 08/20/2016  . HLD (hyperlipidemia) 08/20/2016  . Elevated BP without diagnosis of hypertension 08/20/2016  . Displaced intertrochanteric fracture of right femur, initial encounter for closed fracture (HCC) 08/20/2016  . Glaucoma 07/01/2016  . Acute respiratory failure with hypoxemia (HCC) 07/01/2016  . Rheumatoid arthritis (HCC) 04/18/2016  . Subacute confusional state 04/18/2016  . Closed displaced fracture of left femoral neck (HCC) 04/13/2016  . Hip  fracture requiring operative repair, left, closed, initial encounter (HCC) 04/13/2016    Orientation RESPIRATION BLADDER Height & Weight     Self  Room Air Incontinent Weight: 88 lb (39.9 kg) Height:  5\' 2"  (157.5 cm)  BEHAVIORAL SYMPTOMS/MOOD NEUROLOGICAL BOWEL NUTRITION STATUS      Incontinent Diet pureed  AMBULATORY STATUS COMMUNICATION OF NEEDS Skin   Total Care Non-Verbally Other (Comment)     Pressure Injury Unstageable Other (Comment) Sacrum Medial sacral wound with slough and purulent drainage; Dressing Type: Abdominal Pads/Moist to Dry changed twice a day                   Personal Care Assistance Level of Assistance  Bathing, Feeding, Dressing, Total care Bathing Assistance: Maximum assistance Feeding assistance: Maximum assistance Dressing Assistance: Maximum assistance Total Care Assistance: Maximum assistance   Functional Limitations Info  Sight, Hearing, Speech Sight Info: Adequate Hearing Info: Adequate Speech Info: Adequate    SPECIAL CARE FACTORS FREQUENCY                       Contractures      Additional Factors Info  Code Status, Allergies Code Status Info: DNR Allergies Info: NKA             Discharge Medications:  Medication List    STOP taking these medications   aspirin 81 MG chewable tablet   diclofenac sodium 1 % Gel Commonly known as:  VOLTAREN   enoxaparin 30 MG/0.3ML injection Commonly known as:  LOVENOX   folic acid 1 MG tablet Commonly known as:  FOLVITE   furosemide  20 MG tablet Commonly known as:  LASIX   lidocaine 4 % cream Commonly known as:  LMX   Melatonin 5 MG Tabs   methotrexate 2.5 MG tablet Commonly known as:  RHEUMATREX   mirtazapine 15 MG tablet Commonly known as:  REMERON   neomycin-bacitracin-polymyxin [phone] ointment   nitrofurantoin (macrocrystal-monohydrate) 100 MG capsule Commonly known as:  MACROBID   NON FORMULARY   olopatadine 0.1 % ophthalmic  solution Commonly known as:  PATANOL   potassium chloride 10 MEQ tablet Commonly known as:  K-DUR,KLOR-CON   senna 8.6 MG Tabs tablet Commonly known as:  SENOKOT   traZODone 50 MG tablet Commonly known as:  DESYREL   trolamine salicylate 10 % cream Commonly known as:  ASPERCREME   vitamin B-12 1000 MCG tablet Commonly known as:  CYANOCOBALAMIN   Vitamin D3 2000 units capsule     TAKE these medications   acetaminophen 650 MG CR tablet Commonly known as:  TYLENOL Take 650 mg by mouth 3 (three) times daily.   albuterol (2.5 MG/3ML) 0.083% nebulizer solution Commonly known as:  PROVENTIL Take 2.5 mg by nebulization every 6 (six) hours as needed for wheezing or shortness of breath.   cycloSPORINE 0.05 % ophthalmic emulsion Commonly known as:  RESTASIS Place 1 drop into both eyes 2 (two) times daily.   LORazepam 0.5 MG tablet Commonly known as:  ATIVAN Take 0.25 mg by mouth 2 (two) times daily as needed for anxiety.   OLANZapine 2.5 MG tablet Commonly known as:  ZYPREXA Take 1.25 mg by mouth daily.   ondansetron 4 MG tablet Commonly known as:  ZOFRAN Take 4 mg by mouth every 12 (twelve) hours as needed for nausea or vomiting.   polyethylene glycol packet Commonly known as:  MIRALAX / GLYCOLAX Take 17 g by mouth daily as needed for mild constipation.   traMADol 50 MG tablet Commonly known as:  ULTRAM Take 50 mg by mouth 2 (two) times daily.     Relevant Imaging Results:  Relevant Lab Results:   Additional Information SSN [id number]   [facility]     [place], [place]

## 2018-01-29 NOTE — Progress Notes (Signed)
PMT progress note  Patient resting in bed Chart reviewed, ID notes reviewed.  Discussed with CSW and hospice liaison.   Appreciate SLP eval and recommendations.   Patient undergoing hydrotherapy.    BP (!) 109/55 (BP Location: Right Arm)   Pulse 89   Temp 98.2 F (36.8 C) (Axillary)   Resp 18   Ht 5\' 2"  (1.575 m)   Wt 39.9 kg   SpO2 97%   BMI 16.10 kg/m  Labs and imaging noted  PPS 30%  Frail weak lady No distress Thin has muscle wasting No dyspnea noted  84 yo lady with osteomyelitis, myositis, UTI, frailty, diminished quality of life, non mobility.   After patient was deemed not an appropriate candidate for residential hospice, the decision was made to proceed with the patient returning to her facility with hospice support over there.  No additional PMT specific recommendations.  Discussed with TRH MD.    15 minutes spent 83 MD [facility] health palliative medicine team [phone]

## 2018-01-29 NOTE — Progress Notes (Signed)
01/29/18  1602  Called report to [facility]. Report given to [facility].

## 2018-01-29 NOTE — Progress Notes (Signed)
Patient returning to [facility] with hospice services following.   CSW spoke with [facility] staff member [place], staff confirmed that they are following patient to ALF.   [facility] staff member [place], staff confirmed documents received and patient's ability to return.  [facility] contacted, patient's son Leana [place]) notified. Patient's RN can call report to [phone], packet complete.   CSW signing off, no other needs identified at this time.  Celso Sickle, [place] Clinical Social Worker [facility] Cell#: [phone]

## 2018-01-29 NOTE — Progress Notes (Signed)
CSW following to assist with discharge planning.  CSW notified by [facility] hospital liaison that [facility] MD did not approve patient for admission to [facility].  CSW updated patient's son Willowdean Luhmann) and inquired if patient's son was interested in another residential hospice facility. Patient's son declined interest in another hospice facility and inquired about patient returning to [facility] with hospice services. CSW agreed to follow up with [facility].  CSW contacted [facility] and spoke with staff member [place] who confirmed ability to accept patient back with hospice services. Staff requested clinical documents for review, CSW agreed to send. Staff agreed to contact CSW after reviewing.  CSW sent clinical documents for review. CSW awaiting return call from [facility] staff.

## 2018-01-29 NOTE — Progress Notes (Signed)
   01/29/18  Hydrotherapy note [id number]  Subjective Assessment  Subjective nonverbal (moans and groans)  Patient and Family Stated Goals unable  Date of Onset  (present on adm.)  Prior Treatments dressing changes  Evaluation and Treatment  Evaluation and Treatment Procedures Explained to Patient/Family Yes  Evaluation and Treatment Procedures Patient unable to consent due to mental status  Wound / Incision (Open or Dehisced) 01/28/18 Other (Comment) Sacrum Medial sacral wound with slough and purulent drainage  Date First Assessed/Time First Assessed: 01/28/18 1500   Wound Type: Other (Comment)  Location: Sacrum  Location Orientation: Medial  Wound Description (Comments): sacral wound with slough and purulent drainage  Present on Admission: Yes  Dressing Type ABD;Moist to dry  Dressing Changed New  Dressing Status Clean  Dressing Change Frequency Twice a day  Site / Wound Assessment Dusky;Friable;Granulation tissue;Painful  % Wound base Red or Granulating 20%  % Wound base Yellow/Fibrinous Exudate 80%  Peri-wound Assessment Excoriated;Maceration;Purple  Wound Length (cm) 8 cm  Wound Width (cm) 6 cm  Wound Depth (cm) 3 cm  Wound Volume (cm^3) 144 cm^3  Wound Surface Area (cm^2) 48 cm^2  Tunneling (cm) 3 (at 9:00, tunnels inward toward body)  Undermining (cm) 3 (around most of periphery)  Margins Unattached edges (unapproximated)  Drainage Amount Moderate  Drainage Description Purulent;Odor (able to express purulent from cephalic end of wound, patient moans) Patient premedicated with IV medication  Non-staged Wound Description Full thickness  Treatment Debridement (Selective);Hydrotherapy (Pulse lavage);Packing (Saline gauze)  Hydrotherapy  Pulsed lavage therapy - wound location sacrum  Pulsed Lavage with Suction (psi) 4 psi  Pulsed Lavage with Suction - Normal Saline Used 1000 mL  Pulsed Lavage Tip Tip with splash shield  Selective Debridement  Selective Debridement -  Location sacrum  Selective Debridement - Tools Used Forceps;Scissors  Selective Debridement - Tissue Removed slough  Wound Therapy - Assess/Plan/Recommendations  Wound Therapy - Clinical Statement Elderly female admitted with pressure injury to sacrum. Wound draining purulent  yellow drainage, minimal granulation tissue. the patient is requiring total assistance for care and positioning. DC note indicates patient to be followed by Hospice. If this is the case, PT will sign off. Will await firm plan as indicated upcoming  epic notes.   Wound Therapy - Functional Problem List total care  Factors Delaying/Impairing Wound Healing Immobility;Multiple medical problems  Hydrotherapy Plan Dressing change;Pulsatile lavage with suction  Wound Therapy - Frequency 6X / week  Wound Therapy - Current Recommendations PT  Wound Therapy - Follow Up Recommendations  (may be comfort care/Hospice)  Wound Plan PLS and dressing changes  Wound Therapy Goals - Improve the function of patient's integumentary system by progressing the wound(s) through the phases of wound healing by:  Decrease Necrotic Tissue to 50  Decrease Necrotic Tissue - Progress Progressing toward goal  Increase Granulation Tissue to 50  Increase Granulation Tissue - Progress Progressing toward goal  Goals/treatment plan/discharge plan were made with and agreed upon by patient/family No, Patient unable to participate in goals/treatment/discharge plan and family unavailable  Time For Goal Achievement 2 weeks  Blanchard Kelch PT Acute Rehabilitation Services Pager [phone] Office [phone]

## 2018-01-30 LAB — AEROBIC/ANAEROBIC CULTURE W GRAM STAIN (SURGICAL/DEEP WOUND)

## 2018-01-30 LAB — AEROBIC/ANAEROBIC CULTURE (SURGICAL/DEEP WOUND): GRAM STAIN: NONE SEEN

## 2018-02-02 LAB — CULTURE, BLOOD (ROUTINE X 2): Special Requests: ADEQUATE

## 2018-03-29 DEATH — deceased

## 2019-05-01 IMAGING — CR DG TIBIA/FIBULA 2V*L*
3 series · 3 of 3 positions shown · non-contrast
Comparison: None.

CLINICAL DATA: Acute LEFT LOWER leg pain following fall 1 month
ago. Initial encounter.

EXAM:
LEFT TIBIA AND FIBULA - 2 VIEW

[x tib-fib ap left]
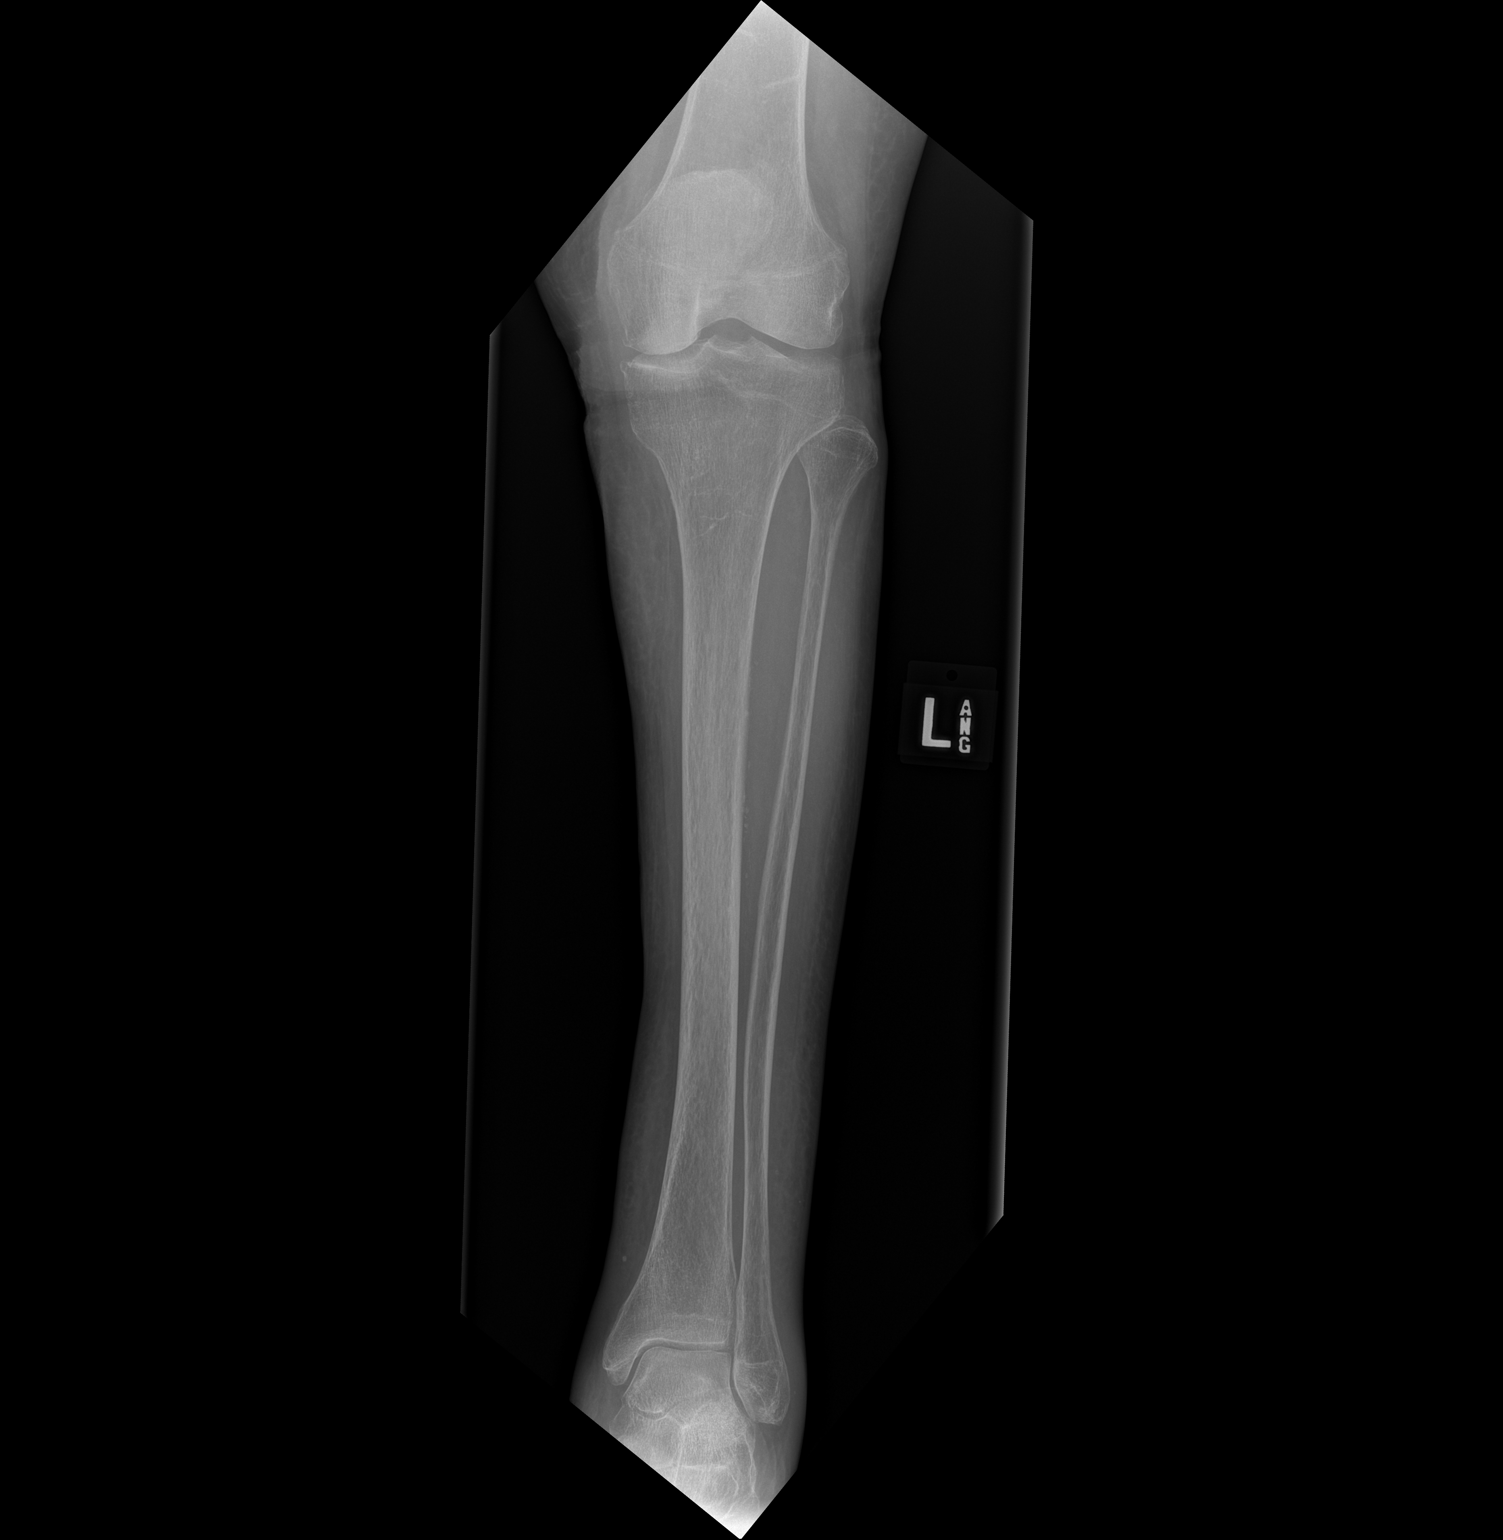

[x tib-fib lat left (1 of 2)]
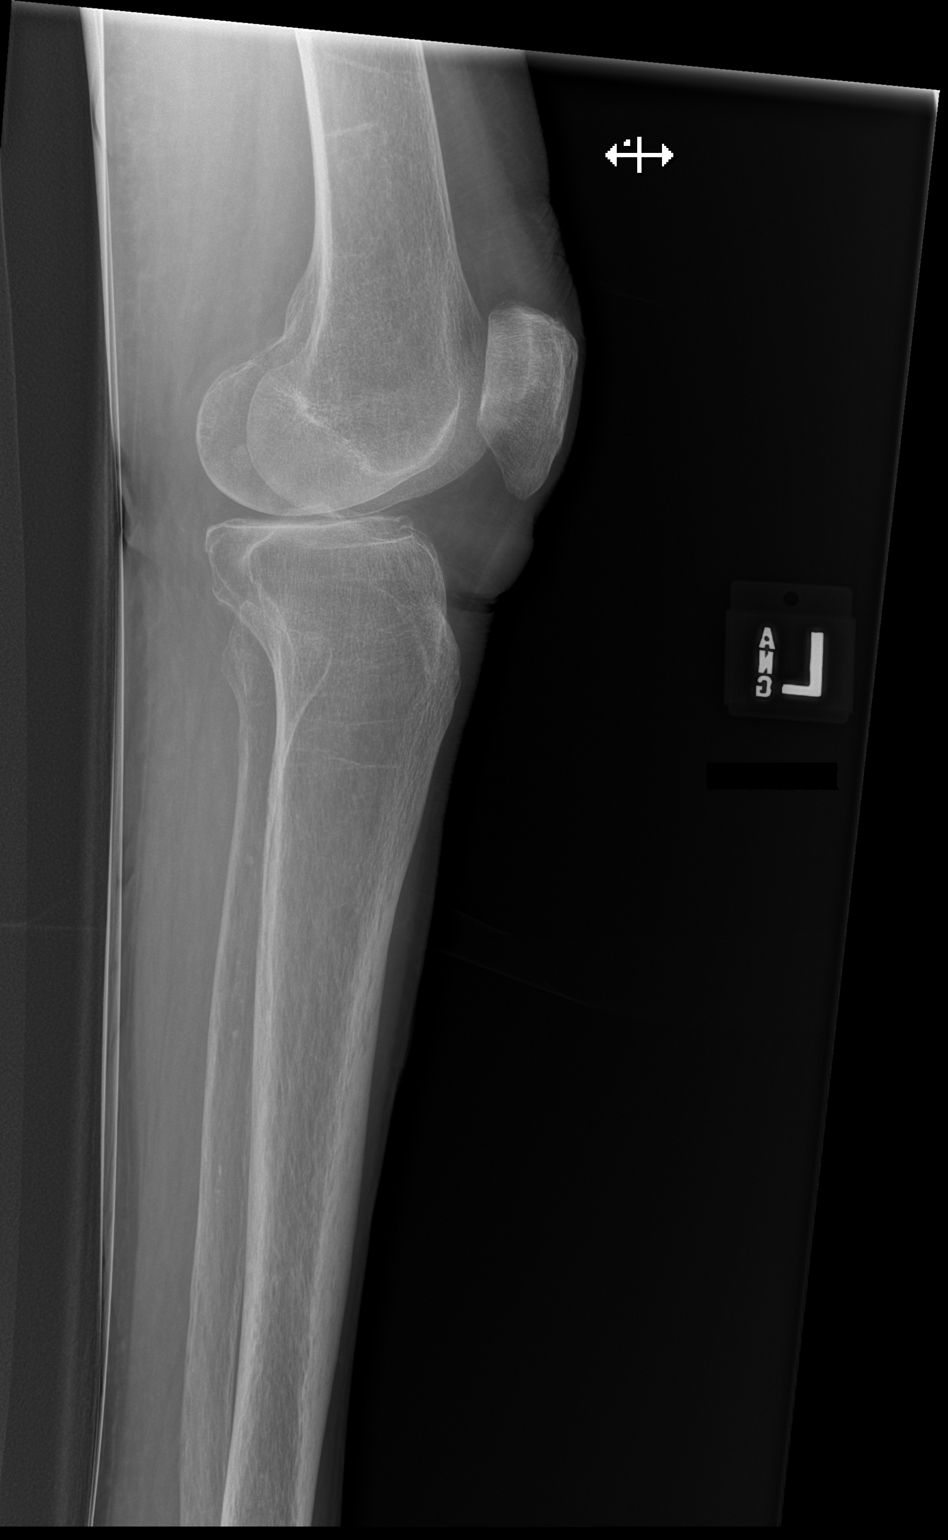

[x tib-fib lat left (2 of 2)]
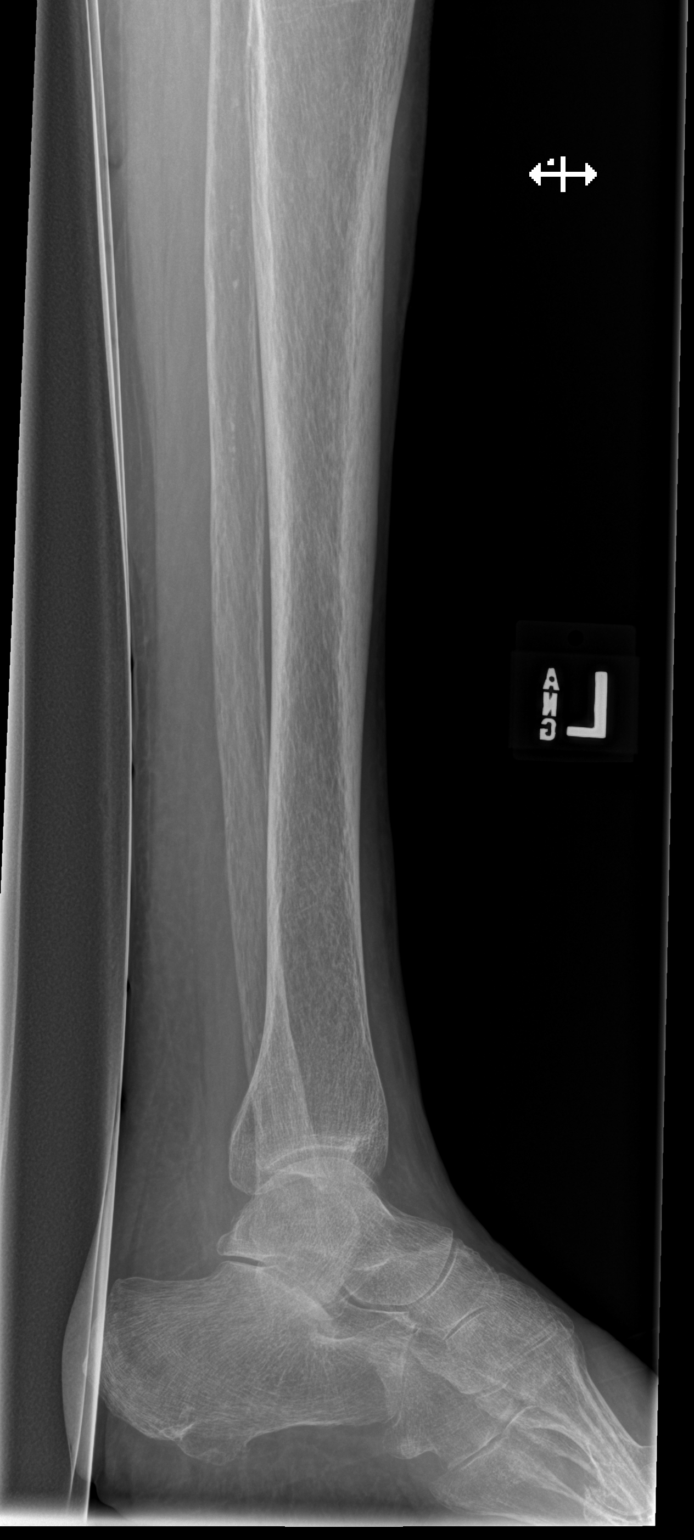

[3 of 3 positions shown; findings below may reference images not displayed]

FINDINGS: No acute fracture, subluxation or dislocation.

No focal bony lesions are present.

Soft tissue swelling is present.
IMPRESSION: Soft tissue swelling without bony abnormality.
